# Patient Record
Sex: Female | Born: 1965 | Race: White | Hispanic: No | Smoking: Former smoker
Health system: Southern US, Community
[De-identification: ages and names within clinical notes are randomized; demographics above are authoritative.]

## PROBLEM LIST (undated history)

## (undated) DIAGNOSIS — C4490 Unspecified malignant neoplasm of skin, unspecified: Secondary | ICD-10-CM

## (undated) DIAGNOSIS — Z8669 Personal history of other diseases of the nervous system and sense organs: Secondary | ICD-10-CM

## (undated) DIAGNOSIS — B009 Herpesviral infection, unspecified: Secondary | ICD-10-CM

## (undated) DIAGNOSIS — K769 Liver disease, unspecified: Secondary | ICD-10-CM

## (undated) DIAGNOSIS — G43909 Migraine, unspecified, not intractable, without status migrainosus: Secondary | ICD-10-CM

## (undated) DIAGNOSIS — Z720 Tobacco use: Secondary | ICD-10-CM

## (undated) DIAGNOSIS — R3129 Other microscopic hematuria: Secondary | ICD-10-CM

## (undated) DIAGNOSIS — K219 Gastro-esophageal reflux disease without esophagitis: Secondary | ICD-10-CM

## (undated) DIAGNOSIS — IMO0002 Reserved for concepts with insufficient information to code with codable children: Secondary | ICD-10-CM

## (undated) DIAGNOSIS — F99 Mental disorder, not otherwise specified: Secondary | ICD-10-CM

## (undated) DIAGNOSIS — K279 Peptic ulcer, site unspecified, unspecified as acute or chronic, without hemorrhage or perforation: Secondary | ICD-10-CM

## (undated) DIAGNOSIS — B977 Papillomavirus as the cause of diseases classified elsewhere: Secondary | ICD-10-CM

## (undated) DIAGNOSIS — N809 Endometriosis, unspecified: Secondary | ICD-10-CM

## (undated) DIAGNOSIS — K589 Irritable bowel syndrome without diarrhea: Secondary | ICD-10-CM

## (undated) DIAGNOSIS — T7840XA Allergy, unspecified, initial encounter: Secondary | ICD-10-CM

## (undated) HISTORY — DX: Liver disease, unspecified: K76.9

## (undated) HISTORY — DX: Tobacco use: Z72.0

## (undated) HISTORY — DX: Papillomavirus as the cause of diseases classified elsewhere: B97.7

## (undated) HISTORY — PX: TUBAL LIGATION: SHX77

## (undated) HISTORY — PX: WISDOM TOOTH EXTRACTION: SHX21

## (undated) HISTORY — DX: Personal history of other diseases of the nervous system and sense organs: Z86.69

## (undated) HISTORY — PX: ABDOMINAL HYSTERECTOMY: SHX81

## (undated) HISTORY — PX: SALIVARY GLAND SURGERY: SHX768

## (undated) HISTORY — DX: Mental disorder, not otherwise specified: F99

## (undated) HISTORY — DX: Herpesviral infection, unspecified: B00.9

## (undated) HISTORY — DX: Gastro-esophageal reflux disease without esophagitis: K21.9

## (undated) HISTORY — PX: OTHER SURGICAL HISTORY: SHX169

## (undated) HISTORY — DX: Irritable bowel syndrome, unspecified: K58.9

## (undated) HISTORY — PX: APPENDECTOMY: SHX54

## (undated) HISTORY — DX: Endometriosis, unspecified: N80.9

## (undated) HISTORY — DX: Peptic ulcer, site unspecified, unspecified as acute or chronic, without hemorrhage or perforation: K27.9

## (undated) HISTORY — DX: Unspecified malignant neoplasm of skin, unspecified: C44.90

## (undated) HISTORY — DX: Allergy, unspecified, initial encounter: T78.40XA

## (undated) HISTORY — DX: Other microscopic hematuria: R31.29

## (undated) HISTORY — DX: Migraine, unspecified, not intractable, without status migrainosus: G43.909

## (undated) HISTORY — DX: Reserved for concepts with insufficient information to code with codable children: IMO0002

---

## 1998-09-09 ENCOUNTER — Other Ambulatory Visit: Admission: RE | Admit: 1998-09-09 | Discharge: 1998-09-09 | Payer: Self-pay | Admitting: Obstetrics

## 1998-10-07 ENCOUNTER — Encounter: Payer: Self-pay | Admitting: Obstetrics

## 1998-10-07 ENCOUNTER — Ambulatory Visit (HOSPITAL_COMMUNITY): Admission: RE | Admit: 1998-10-07 | Discharge: 1998-10-07 | Payer: Self-pay | Admitting: Obstetrics

## 1999-02-11 ENCOUNTER — Emergency Department (HOSPITAL_COMMUNITY): Admission: EM | Admit: 1999-02-11 | Discharge: 1999-02-11 | Payer: Self-pay

## 1999-02-18 ENCOUNTER — Emergency Department (HOSPITAL_COMMUNITY): Admission: EM | Admit: 1999-02-18 | Discharge: 1999-02-18 | Payer: Self-pay | Admitting: Emergency Medicine

## 1999-06-19 ENCOUNTER — Emergency Department (HOSPITAL_COMMUNITY): Admission: EM | Admit: 1999-06-19 | Discharge: 1999-06-19 | Payer: Self-pay | Admitting: Emergency Medicine

## 1999-07-01 ENCOUNTER — Emergency Department (HOSPITAL_COMMUNITY): Admission: EM | Admit: 1999-07-01 | Discharge: 1999-07-01 | Payer: Self-pay | Admitting: *Deleted

## 2000-09-03 ENCOUNTER — Encounter: Payer: Self-pay | Admitting: Family Medicine

## 2000-09-03 ENCOUNTER — Ambulatory Visit (HOSPITAL_COMMUNITY): Admission: RE | Admit: 2000-09-03 | Discharge: 2000-09-03 | Payer: Self-pay | Admitting: Family Medicine

## 2000-10-11 ENCOUNTER — Encounter (INDEPENDENT_AMBULATORY_CARE_PROVIDER_SITE_OTHER): Payer: Self-pay

## 2000-10-11 ENCOUNTER — Ambulatory Visit (HOSPITAL_COMMUNITY): Admission: RE | Admit: 2000-10-11 | Discharge: 2000-10-11 | Payer: Self-pay | Admitting: Gastroenterology

## 2000-11-04 ENCOUNTER — Ambulatory Visit (HOSPITAL_COMMUNITY): Admission: RE | Admit: 2000-11-04 | Discharge: 2000-11-04 | Payer: Self-pay | Admitting: Family Medicine

## 2000-11-04 ENCOUNTER — Encounter: Payer: Self-pay | Admitting: Family Medicine

## 2000-11-16 ENCOUNTER — Encounter: Payer: Self-pay | Admitting: Family Medicine

## 2000-11-16 ENCOUNTER — Encounter: Admission: RE | Admit: 2000-11-16 | Discharge: 2000-11-16 | Payer: Self-pay | Admitting: Family Medicine

## 2002-02-23 ENCOUNTER — Encounter: Payer: Self-pay | Admitting: Family Medicine

## 2002-02-23 ENCOUNTER — Ambulatory Visit (HOSPITAL_COMMUNITY): Admission: RE | Admit: 2002-02-23 | Discharge: 2002-02-23 | Payer: Self-pay | Admitting: Family Medicine

## 2002-08-01 ENCOUNTER — Ambulatory Visit (HOSPITAL_COMMUNITY): Admission: RE | Admit: 2002-08-01 | Discharge: 2002-08-01 | Payer: Self-pay | Admitting: Family Medicine

## 2002-08-01 ENCOUNTER — Encounter: Payer: Self-pay | Admitting: Family Medicine

## 2002-08-17 ENCOUNTER — Other Ambulatory Visit: Admission: RE | Admit: 2002-08-17 | Discharge: 2002-08-17 | Payer: Self-pay | Admitting: Family Medicine

## 2003-09-04 ENCOUNTER — Other Ambulatory Visit: Admission: RE | Admit: 2003-09-04 | Discharge: 2003-09-04 | Payer: Self-pay | Admitting: Obstetrics and Gynecology

## 2004-05-05 ENCOUNTER — Ambulatory Visit (HOSPITAL_COMMUNITY): Admission: RE | Admit: 2004-05-05 | Discharge: 2004-05-05 | Payer: Self-pay | Admitting: Family Medicine

## 2004-10-02 ENCOUNTER — Other Ambulatory Visit: Admission: RE | Admit: 2004-10-02 | Discharge: 2004-10-02 | Payer: Self-pay | Admitting: Obstetrics and Gynecology

## 2004-10-15 ENCOUNTER — Ambulatory Visit (HOSPITAL_COMMUNITY): Admission: RE | Admit: 2004-10-15 | Discharge: 2004-10-15 | Payer: Self-pay | Admitting: Obstetrics and Gynecology

## 2004-10-26 HISTORY — PX: OTHER SURGICAL HISTORY: SHX169

## 2005-04-20 ENCOUNTER — Encounter (INDEPENDENT_AMBULATORY_CARE_PROVIDER_SITE_OTHER): Payer: Self-pay | Admitting: *Deleted

## 2005-04-21 ENCOUNTER — Inpatient Hospital Stay (HOSPITAL_COMMUNITY): Admission: RE | Admit: 2005-04-21 | Discharge: 2005-04-22 | Payer: Self-pay | Admitting: Obstetrics and Gynecology

## 2005-10-22 ENCOUNTER — Ambulatory Visit (HOSPITAL_COMMUNITY): Admission: RE | Admit: 2005-10-22 | Discharge: 2005-10-22 | Payer: Self-pay | Admitting: Obstetrics and Gynecology

## 2005-11-02 ENCOUNTER — Other Ambulatory Visit: Admission: RE | Admit: 2005-11-02 | Discharge: 2005-11-02 | Payer: Self-pay | Admitting: Obstetrics and Gynecology

## 2006-11-04 ENCOUNTER — Ambulatory Visit (HOSPITAL_COMMUNITY): Admission: RE | Admit: 2006-11-04 | Discharge: 2006-11-04 | Payer: Self-pay | Admitting: Obstetrics and Gynecology

## 2007-11-07 ENCOUNTER — Ambulatory Visit (HOSPITAL_COMMUNITY): Admission: RE | Admit: 2007-11-07 | Discharge: 2007-11-07 | Payer: Self-pay | Admitting: Obstetrics and Gynecology

## 2008-12-06 ENCOUNTER — Ambulatory Visit (HOSPITAL_COMMUNITY): Admission: RE | Admit: 2008-12-06 | Discharge: 2008-12-06 | Payer: Self-pay | Admitting: Family Medicine

## 2009-12-10 ENCOUNTER — Ambulatory Visit (HOSPITAL_COMMUNITY): Admission: RE | Admit: 2009-12-10 | Discharge: 2009-12-10 | Payer: Self-pay | Admitting: Obstetrics and Gynecology

## 2010-11-13 ENCOUNTER — Other Ambulatory Visit (HOSPITAL_COMMUNITY): Payer: Self-pay | Admitting: Obstetrics and Gynecology

## 2010-11-13 DIAGNOSIS — Z1231 Encounter for screening mammogram for malignant neoplasm of breast: Secondary | ICD-10-CM

## 2010-12-12 ENCOUNTER — Ambulatory Visit (HOSPITAL_COMMUNITY): Payer: Self-pay

## 2011-01-02 ENCOUNTER — Ambulatory Visit (HOSPITAL_COMMUNITY)
Admission: RE | Admit: 2011-01-02 | Discharge: 2011-01-02 | Disposition: A | Discharge status: Home / Self Care | Payer: Self-pay | Source: Ambulatory Visit | Attending: Obstetrics and Gynecology | Admitting: Obstetrics and Gynecology

## 2011-01-02 DIAGNOSIS — Z1231 Encounter for screening mammogram for malignant neoplasm of breast: Secondary | ICD-10-CM | POA: Insufficient documentation

## 2011-03-13 NOTE — H&P (Signed)
Danielle Strickland, Danielle Strickland            ACCOUNT NO.:  [id number]   MEDICAL RECORD NO.:  [id number]          PATIENT TYPE:  INP   LOCATION:  NA                            FACILITY:  [facility]   PHYSICIAN:  Hal Morales, M.D.DATE OF BIRTH:  10/07/1966   DATE OF ADMISSION:  04/20/2005  DATE OF DISCHARGE:                                HISTORY & PHYSICAL   HISTORY OF PRESENT ILLNESS:  The patient is a 45 year old white married  female, para 3-0-0-3, who presents for hysterectomy and possible  oophorectomy secondary to a long history of pelvic pain presumably due to  endometriosis with failed attempts at medical management. The patient was  initially seen in 1995 by our practice for complaints of pelvic pain. She  had undergone a tubal ligation in the mid-1990s by laparoscopic tubal  cautery and at that time had the diagnosis of endometriosis made as an  incidental finding. She was initially treated with oral contraceptive pills  for her pelvic pain but subsequent to that had recurrence of pelvic pain  that was not responsive to her oral contraceptive pills. The patient also  had her birth control pills discontinued because of her smoking history. She  was lost to follow up for several years then returned to our care in 2004 at  which time she had primarily dysmenorrhea and heavy flow with her menses  which occurred at that time every 26 to 30 days without any intramenstrual  bleeding. Her workup included the pelvic ultrasound which showed only a  right ovarian simple cyst and she was initially treated with Provera 30 mg a  day. She had some improvement in her symptomatology with that medication for  several months but did have some irregular bleeding on the Provera lasting  for almost a month. She subsequently after about five months on Provera had  return of her pelvic pain and in October of 2004 was started on Lupron  therapy for her pelvic pain presumably secondary to endometriosis. She had  good relief of her pelvic pain during the time that she used the Lupron. She  received a total of six months of therapy with Lupron with good relief of  her pain and subsequently went on to Norethindrone 15 mg a day.   She continued with reasonable pain relief for approximately three months on  this regimen; however, she had significant breakthrough bleeding and then  breakthrough pain. At this point, the patient has bled for greater than  three months off and on, with  the Aygestin and has had much worsening of  her pelvic pain. She has thus decided to proceed with definitive therapy for  her endometriosis; however, she does want to retain her ovaries if at all  possible and if there is no visibile evidence of endometriosis.   PAST MEDICAL HISTORY:  1. Significant for melanoma excised in 2002 and for which she has yearly      follow up with Dr. Danella Deis.  2. History of peptic ulcer disease.  3. Irritable bowel syndrome.  4. History of benign colon polyps.  5. Migraine headaches.  6. History of  chemical hepatitis.   PAST SURGICAL HISTORY:  1. Tubal ligation in 1990.  2. Appendectomy prior to 1990.  3. Diagnostic laparoscopy at the time of tubal ligation in 1990.   FAMILY HISTORY:  Positive for colon cancer and breast cancer.   CURRENT MEDICATIONS:  1. Zelnorm 6 mg 1-2 tablets daily.  2. Protonix 40 mg daily.  3. Imitrex 100 mg at the onset of a headache p.r.n.  4. Norethindrone 20 mg daily.  5. Alprazolam 0.5 mg 1/2 to 1 up to t.i.d.  6. Tylenol PM 500 mg 2 h.s.  7. Tramadol 50 mg q.6h. p.r.n. pain.   ALLERGIES:  AUGMENTIN, CODEINE, LATEX.   REVIEW OF SYMPTOMS:  Positive for intermittent headaches, symptoms of  gastroesophageal reflux disease with occasional nausea, pelvic pain,  interventional bleeding all as mentioned above, constipation associated with  irritable bowel syndrome, anxiety.   SOCIAL HISTORY:  The patient is married and lives with her husband and  children.  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 She smokes approximately  a half pack of cigarettes a day but has smoked as much as two packs of  cigarettes per day. She has been smoking since she was 45 years old.   PHYSICAL EXAMINATION:  GENERAL:  The patient is a well-developed white  female in no acute distress.  VITAL SIGNS:  Blood pressure is 110/70. Weight is 159 pounds. Temperature is  98.7.  LUNGS:  Clear.  HEART:  Regular rate and rhythm.  BREASTS:  Without dominant masses, discharge, skin changes, or nipple  retraction.  ABDOMEN:  Soft without masses or organomegaly. There is mild tenderness to  deep palpation in both lower quadrants but no rebound.  PELVIC:  EG/BUS within normal limits. The vagina is rugous without lesions.  The cervix is without gross lesions. There is a small amount of blood from  the cervical os. The uterus sounds to 8 cm and is normal size, shape,  consistency, mobile with minimal tenderness on palpation. Adnexa show no  palpable masses. Rectovaginal show no masses.   LABORATORY DATA:  Endometrial biopsy performed on April 15, 2005 was benign.  Pap smear performed on October 02, 2004 was within normal limits and  negative for intraepithelial lesion or malignancy.   IMPRESSION:  1. Long history of pelvic pain and more recently abnormal uterine bleeding      with pelvic pain, thought to be secondary to endometriosis which was      diagnosed at the time of tubal ligation.  2. Failure of long-term relief from multiple medical modalities for      endometriosis.  3. Desire for hysterectomy both in management of endometriosis and      abnormal bleeding.  4. Cigarette abuse.  5. History of peptic ulcer disease with current diagnosis of      gastroesophageal reflux disease.  6. Migraine headaches.  7. Irritable bowel syndrome.   PLAN:  Several discussions have been held with the patient concerning options for management of her endometriosis and she has chosen  hysterectomy.  The patient has good mobility of the uterus and a laparoscopically assisted  vaginal hysterectomy may be possible. If laparoscopically, the patient has  significant adhesions or endometriosis that can not be managed  laparoscopically, then a total abdominal hysterectomy will be performed. If  there is visible evidence of endometriosis, the patient would like to have  the effected ovary or ovaries removed. If, however, there is no visible  evidence of endometriosis, the patient would  like to have her ovaries  preserved for hormonal function. Several discussions have been held  concerning the risks of anesthesia, bleeding, infection, damage to adjacent  organs as a result of her surgical procedure. It has  been explained that  there are other risks that cannot be predicted.  I have also explained that  no guarentee can be given with respect to pain relief after surgery, and the  patient has acknowledged understanding of these explanations. She wishes to  proceed with laparoscopically assisted vaginal hysterectomy with possible  total abdominal hysterectomy, possible unilateral salpingo-oophorectomy, and  possible bilateral salpingo-oophorectomy at [facility] on April 20, 2005.       VPH/MEDQ  D:  04/20/2005  T:  04/20/2005  Job:  [id number]

## 2011-03-13 NOTE — Op Note (Signed)
[facility]  Patient:    Danielle Strickland, Danielle Strickland                      MRN: [id number] Proc. Date: 10/11/00 Adm. Date:  [id number] Attending:  Nelda Marseille CC:         Danielle Strickland, M.D.   Operative Report  PROCEDURE:  Colonoscopy with hot biopsy.  ENDOSCOPIST:  Petra Kuba, M.D.  INDICATIONS:  Chronic constipation and family history of colon cancer. Consent was signed after risks, benefits, methods, and options were thoroughly discussed in the office.  MEDICINES USED:  Demerol 100, Versed 12.  DESCRIPTION OF PROCEDURE:  Rectum inspection was pertinent for external hemorrhoids.  Digital exam was negative.  The video pediatric colonoscope was inserted and easily advanced to the mid transverse.  At that point, the scope began to loop and, despite rolling her on her back and multiple abdominal pressures, we could not control the loop.  The scope was slowly withdrawn.  No abnormalities were seen on slow withdrawal back to the rectum.  Once back in the rectum, the scope was retroflexed and pertinent for some internal hemorrhoids and a small rectal polyp which was cold biopsied at the end of the procedure.  The scope was removed, and the regular video colonoscope was inserted and again easily advanced to the same location in the mid transverse. This time with rolling on her back and abdominal pressure was applied, we were able to control the loop and were easily able to advance from that point to the cecum which was identified by the appendiceal orifice and the ileocecal valve.  In fact the scope was inserted a short ways into the terminal ileum which was normal.  Photo documentation was obtained.  The scope was slowly withdrawn.  The prep was adequate.  There was some liquid stool that required suctioning only.  The cecum, ascending, and transverse were normal.  In the mid descending, a questionable tiny 1 to 2 mm polyp was seen and was  cold biopsied x 3.  The scope was further withdrawn.  No other abnormalities were seen as we slowly withdrew back to the rectum.  Another questionable tiny 1 mm polyp was seen in the rectum and was cold biopsied x 1.  The scope was retroflexed, and the polyp seen earlier was then cold biopsied x 1.  The scope was straightened, withdrawn, and scope removed.  The patient tolerated the procedure fairly well.  There was no obvious immediate complication.  ENDOSCOPIC DIAGNOSIS: 1. Internal and external hemorrhoids, possibly a tiny prolapse not mentioned    above. 2. Three tiny polyps, two in the rectum, one on retroflexion, and one in the    descending, all cold biopsied. 3. Otherwise within normal limits to the terminal ileum.  PLAN:  Await pathology but probably repeat screening in five years.  Trial of MiraLax for constipation.  Followup p.r.n. or in two months. DD:  10/11/00 TD:  10/11/00 Job: [id number]

## 2011-03-13 NOTE — Op Note (Signed)
NAMESIGOURNEY, PORTILLO            ACCOUNT NO.:  [id number]   MEDICAL RECORD NO.:  [id number]          PATIENT TYPE:  INP   LOCATION:  9399                          FACILITY:  [facility]   PHYSICIAN:  Hal Morales, M.D.DATE OF BIRTH:  [birth year]   DATE OF PROCEDURE:  04/20/2005  DATE OF DISCHARGE:                                 OPERATIVE REPORT   PREOPERATIVE DIAGNOSES:  1. Pelvic pain.  2. Endometriosis.  3. Abnormal uterine bleeding.   POSTOPERATIVE DIAGNOSES:  1. Pelvic pain.  2. Endometriosis.  3. Abnormal uterine bleeding.   OPERATION:  Laparoscopic bilateral salpingectomy with excisional biopsy of  peritoneal endometriosis, total vaginal hysterectomy.   SURGEON:  Hal Morales, M.D.   FIRST ASSISTANT:  Henreitta Leber, Certified Physician Assistant.   ANESTHESIA:  General orotracheal.   ESTIMATED BLOOD LOSS:  100 cc.   COMPLICATIONS:  None.   FINDINGS:  The uterus was normal size with stigmata of endometriosis at the  cornual region on the right side. The tubes were status post tubal  interruption for sterilization. The ovaries were normal bilaterally without  adhesions or stigmata of endometriosis.  There were stigmata of  endometriosis in the anterior cul-yuiy7de-sac and the left pelvic side wall,  both of which were excisionally biopsied.  There was no cul-de-sac  endometriosis that could be appreciated. The patient was status post  appendectomy.   DESCRIPTION OF PROCEDURE:  The patient was taken to the operating room after  appropriate identification and placed on the operating table.  After the  attainment of adequate general anesthesia, she was placed in the modified  lithotomy position.  The abdomen, perineum and vagina were prepped with  multiple layers of Betadine and a Foley catheter inserted into the bladder  and connected to straight drainage.  A Hulka tenaculum was placed on the  anterior cervix.  The abdomen and perineum were draped in sterile  field.  A  subumbilical injection and suprapubic injections of the right and left side  of 0.25% Marcaine was undertaken for a total of 10 cc.  A subumbilical  incision was made and Veress cannula placed through that incision into the  peritoneal cavity. A pneumoperitoneum was created with 2-1/2 liter of CO2.  The Veress cannula was removed and laparoscopic trocar placed through that  incision into the peritoneal cavity.  The laparoscope was placed through the  trocar sleeve. Suprapubic incisions were made to the right and left of  midline, and laparoscopic trocars placed through those incisions into the  peritoneal cavity under direct visualization.  The above-noted findings were  made, and excisional biopsy of the peritoneal endometriosis was undertaken.  Bilateral distal salpingectomy was undertaken by elevating the distal tube  and using two Endoloops of 0 Vicryl on each side, then excising the tubes  distal to the Endo-loops and bringing the tube through the umbilical port of  the laparoscope. These were sent as specimens with the uterus.  Hemostasis  was noted to be adequate and all instruments were turned off for use and the  surgeons moved to the vaginal area as the site of operation.  A weighted  speculum was placed in the posterior vagina and Lahey tenaculum placed on  the anterior and posterior surfaces of the cervix. The cervicovaginal mucosa  was injected with a dilute solution of Pitressin and circumscribed.  The  anterior vaginal mucosa was dissected off the anterior cervix and the  anterior peritoneum entered sharply.  A retractor was used to elevate the  bladder.  The posterior cul-de-sac was then entered sharply and the  uterosacral ligaments of right and left side clamped, cut and suture ligated  with those sutures being held.  The paracervical tissues were then clamped,  cut and suture ligated and the parametrial tissues clamped, cut and suture  ligated. The uterus was  inverted and the upper pedicles clamped, cut and  suture ligated x2 with those sutures being held.  A hemostatic suture needed  to be placed in the right vaginal cuff to incorporate a bleeding area, and  this allowed for adequate hemostasis.  A McCall culdoplasty suture was then  placed in the posterior cul-de-sac incorporating the two uterosacral  ligaments and the intervening peritoneum.  This was held.  The vaginal  angles were created with the held sutures on the uterosacral ligaments being  placed through the anterior and posterior vaginal mucosa and tied down at  each angle.  The remainder of the vaginal cuff was closed in a single layer  incorporating the anterior vaginal mucosa, anterior peritoneum, posterior  peritoneum and posterior vaginal mucosa in figure-of-eight sutures, all of 0  Vicryl.  Hemostasis was noted to be adequate and the [facility] culdoplasty  suture was tied down. Vaginal packing with 2 inch plain packing moistened  with Estrace cream was then placed in the vagina. The surgeon changed gloves  and went to the abdomen at the site of operation.  A pneumoperitoneum was  recreated, and the vaginal cuff and pedicles were inspected and found to be  hemostatic.  All instruments were removed from the peritoneal cavity under  direct visualization as the CO2 was allowed to escape. The subumbilical  incision was closed with a fascial suture of 0 Vicryl in a figure-of-eight  fashion and a subcuticular skin suture of 3-0 Vicryl.  The suprapubic  incisions were closed with subcuticular skin sutures.  Sterile dressings  were applied and the patient awakened from general anesthesia and taken to  the recovery room in satisfactory condition having tolerated the procedure  well with sponge and instrument counts correct.   SPECIMENS TO PATHOLOGY:  Uterus and cervix, bilateral tubes and excisional  biopsy of the peritoneal endometriosis.      VPH/MEDQ  D:  04/20/2005  T:  04/20/2005   Job:  [id number]   cc:   Lianne Bushy, M.D.  [address]  [place]  [place] [address]  Fax: [phone]

## 2011-03-13 NOTE — Discharge Summary (Signed)
Danielle Strickland, Danielle Strickland            ACCOUNT NO.:  [id number]   MEDICAL RECORD NO.:  [id number]          PATIENT TYPE:  INP   LOCATION:  9302                          [facility]ACILITY:  [facility]   PHYSICIAN:  Hal Morales, M.D.DATE OF BIRTH:  [birth year]   DATE OF ADMISSION:  04/20/2005  DATE OF DISCHARGE:  04/22/2005                                 DISCHARGE SUMMARY   DISCHARGE DIAGNOSES:  1.  Pelvic pain.  2.  Endometriosis.  3.  Adenomyosis.  4.  Abnormal uterine bleeding.   OPERATION:  On the date of admission, the patient underwent a laparoscopic  bilateral salpingectomy with peritoneal biopsies and a total vaginal  hysterectomy, tolerating all procedures well. The patient was found to have  a normal-size uterus with did not of endometriosis at the cornual region on  the right side. The patient's tubes were status post tubal interruption for  sterilization. Ovaries were normal bilaterally without adhesions or stigmata  of endometriosis. There was stigmata of endometriosis in the anterior cul-de-  sac and left pelvic side wall both of which were excisionally biopsied.  There was no cul-de-sac of endometriosis that could be appreciated. The  patient was status post appendectomy.   HISTORY OF PRESENT ILLNESS:  Mrs. Danielle Strickland is a 45 year old married white  female para 3-0-0-3 who presents for hysterectomy and possible oophorectomy  because of a long history of pelvic pain, presumably due to endometriosis  with failed attempts at medical management. Please see the patient's  dictated history and physical examination for details.   PHYSICAL EXAMINATION:  VITAL SIGNS:  Blood pressure 110/70. Weight is 159.  Temperature 98.7 degrees orally.  GENERAL:  Within normal limits.  ABDOMEN:  Her abdomen was soft without masses or organomegaly. There is mild  tenderness to deep palpation in both lower quadrants but no rebound.  PELVIC:  EG/BUS was within normal limits. Vagina is rugose without  lesions.  Cervix is without gross lesions. This there is a small amount of blood from  the cervical os. The uterus sounded to 8 cm and is normal-size, shape, and  consistency. Mobile with mild tenderness to palpation. Adnexa showed no  palpable masses. Rectovaginal no palpable masses.   HOSPITAL COURSE:  On the day of admission, the patient underwent an  aforementioned procedures tolerating them well. Postoperative course was  unremarkable with the patient resuming bowel and bladder function by  postoperative day #2 and therefore deemed ready for discharge home.  Postoperative hemoglobin 11.1. Preoperative hemoglobin 15.3.   DISCHARGE MEDICATIONS:  1.  Phenergan 25 milligrams 1 tablet every 6 hours as needed for nausea.  2.  Colace 1-2 tablets twice daily until bowel movements are regular.  3.  Tylox 1-2 tablets every 4 hours as needed for pain.  4.  Protonix 40 milligrams 1 tablet daily.  5.  Zelnorm 6 milligrams 1-2 tablets daily.  6.  Imitrex 100 milligrams 1 tablet at the onset of headache.  7.  Alprazolam 0.5 milligrams 1/2 to 1 tablet three times daily as needed.  8.  Flexeril 10 milligrams 1 tablet three times daily for muscle spasms.   FOLLOW-UP:  The patient is scheduled for a six-week postoperative visit with  Dr. Pennie Rushing on May 25, 2005 at 9 a.m. at the new location of [address] [facility] at [address].   DISCHARGE INSTRUCTIONS:  The patient was given a copy of [facility] postoperative instruction sheet. She was further advised to avoid  driving for two weeks, heavy lifting for four weeks and intercourse for six  weeks. The patient's diet was without restriction.   FINAL PATHOLOGY:  Peritoneal biopsy: Findings consistent with endometriosis.  Uterus, cervix, right and left fallopian tubes: Chronic cervicitis with  squamous metaplasia. No intraepithelial lesion identified. Endometrium:  Inactive endometrial glands and  decidualized stroma suggestive of exogenous  progestin therapy. No hyperplasia or malignancy identified. Myometrium:  Adenomyosis. Right and left fallopian tubes: Benign fallopian tubes.       EJP/MEDQ  D:  05/11/2005  T:  05/12/2005  Job:  [id number]

## 2012-01-27 ENCOUNTER — Other Ambulatory Visit: Payer: Self-pay | Admitting: Obstetrics and Gynecology

## 2012-05-14 ENCOUNTER — Other Ambulatory Visit: Payer: Self-pay | Admitting: Obstetrics and Gynecology

## 2012-08-17 ENCOUNTER — Other Ambulatory Visit: Payer: Self-pay | Admitting: Obstetrics and Gynecology

## 2012-08-17 DIAGNOSIS — Z1231 Encounter for screening mammogram for malignant neoplasm of breast: Secondary | ICD-10-CM

## 2012-08-24 ENCOUNTER — Ambulatory Visit: Payer: BC Managed Care – PPO | Admitting: Obstetrics and Gynecology

## 2012-09-07 ENCOUNTER — Encounter: Payer: Self-pay | Admitting: Obstetrics and Gynecology

## 2012-09-07 ENCOUNTER — Ambulatory Visit (HOSPITAL_COMMUNITY)
Admission: RE | Admit: 2012-09-07 | Discharge: 2012-09-07 | Disposition: A | Discharge status: Home / Self Care | Payer: BC Managed Care – PPO | Source: Ambulatory Visit | Attending: Obstetrics and Gynecology | Admitting: Obstetrics and Gynecology

## 2012-09-07 ENCOUNTER — Ambulatory Visit (INDEPENDENT_AMBULATORY_CARE_PROVIDER_SITE_OTHER): Payer: BC Managed Care – PPO | Admitting: Obstetrics and Gynecology

## 2012-09-07 ENCOUNTER — Telehealth: Payer: Self-pay | Admitting: Obstetrics and Gynecology

## 2012-09-07 VITALS — BP 120/66 | Ht 64.0 in | Wt 154.0 lb

## 2012-09-07 DIAGNOSIS — Z01419 Encounter for gynecological examination (general) (routine) without abnormal findings: Secondary | ICD-10-CM

## 2012-09-07 DIAGNOSIS — Z1231 Encounter for screening mammogram for malignant neoplasm of breast: Secondary | ICD-10-CM | POA: Insufficient documentation

## 2012-09-07 DIAGNOSIS — R319 Hematuria, unspecified: Secondary | ICD-10-CM

## 2012-09-07 LAB — POCT URINALYSIS DIPSTICK
Leukocytes, UA: NEGATIVE
Nitrite, UA: NEGATIVE
Protein, UA: NEGATIVE
pH, UA: 5

## 2012-09-07 MED ORDER — SUMATRIPTAN SUCCINATE 50 MG PO TABS: ORAL_TABLET | ORAL | Status: DC

## 2012-09-07 MED ORDER — PROMETHAZINE HCL 25 MG PO TABS: 25.0000 mg | ORAL_TABLET | Freq: Four times a day (QID) | ORAL | Status: DC | PRN

## 2012-09-07 NOTE — Progress Notes (Signed)
Regular Periods: no hyst  Mammogram: yes  Monthly Breast Ex.: no Exercise: yes  Tetanus < 10 years: no Seatbelts: yes  NI. Bladder Functn.: yes Abuse at home: no  Daily BM's: no Stressful Work: no  Healthy Diet: yes Sigmoid-Colonoscopy: every 5 years   Calcium: yes Medical problems this year: right ear maybe infection    LAST PAP:01/16/2011  Contraception: HYST  Mammogram:  Today at 3:00   PCP: none   PMH: unchanged  FMH: unchanged   Pt is requesting a pap today .

## 2012-09-07 NOTE — Progress Notes (Addendum)
Subjective:    Danielle Strickland is a 46 y.o. female, G3P3003, who presents for an annual exam. The patient reports no problems except (though a newlywed) marital discord. States spouse is bipolar but won't take his medication.  Menstrual cycle:   LMP: No LMP recorded. Patient has had a hysterectomy.             Review of Systems Pertinent items are noted in HPI. Denies pelvic pain, urinary tract symptoms, vaginitis symptoms, irregular bleeding, menopausal symptoms, change in bowel habits or rectal bleeding   Objective:    BP 120/66  Ht 5\' 4"  (1.626 m)  Wt 154 lb (69.854 kg)  BMI 26.43 kg/m2    Wt Readings from Last 1 Encounters:  09/07/12 154 lb (69.854 kg)   Body mass index is 26.43 kg/(m^2). General Appearance: Alert, no acute distress HEENT: Grossly normal Neck / Thyroid: Supple, no thyromegaly or cervical adenopathy Lungs: Clear to auscultation bilaterally Back: No CVA tenderness Breast Exam: No masses or nodes.No dimpling, nipple retraction or discharge. Cardiovascular: Regular rate and rhythm.  Gastrointestinal: Soft, non-tender, no masses or organomegaly Pelvic Exam: EGBUS-wnl, vagina-normal rugae, cervix/uterus-surgically absent; adnexae-no masses or tenderness Rectovaginal: no masses and normal sphincter tone Lymphatic Exam: Non-palpable nodes in neck, clavicular,  axillary, or inguinal regions  Skin: no rashes or abnormalities Extremities: no clubbing cyanosis or edema  Neurologic: grossly normal Psychiatric: Alert and oriented   Assessment:   Routine GYN Exam H/O Abnormal Vaginal Cytology   Plan:  Urged counseling for marital discord and gathering of information on bipolar disorder  PAP sent  RTO 1 year or prn  Shania Bjelland,ELMIRAPA-C

## 2012-09-08 LAB — PAP IG W/ RFLX HPV ASCU

## 2012-09-20 ENCOUNTER — Encounter: Payer: Self-pay | Admitting: Obstetrics and Gynecology

## 2012-09-26 ENCOUNTER — Telehealth: Payer: Self-pay | Admitting: Obstetrics and Gynecology

## 2012-09-26 NOTE — Telephone Encounter (Signed)
Called pt to advise of dense breast letter. Advised pt dense breast puts at increased risk for Breast Cancer. After giving pt all info. Pt states that she has a family history of breast cancer with her maternal grandmother and maternal aunt, pt also states that she is a smoker. Wanting to know if a MRI is reccommended or if she needs to just continue with yearly mmg consider history.

## 2012-10-05 ENCOUNTER — Encounter: Payer: Self-pay | Admitting: Obstetrics and Gynecology

## 2012-10-11 ENCOUNTER — Telehealth: Payer: Self-pay | Admitting: Obstetrics and Gynecology

## 2012-10-11 NOTE — Telephone Encounter (Signed)
Ep, this is pt's second call. [name] D,CMA last spoke with pt.

## 2012-10-20 NOTE — Telephone Encounter (Signed)
Patient with maternal grandmother/aunt with a history of breast cancer and personal history of dense breast asking if an MRI is recommended.  Current guidelines do not recommend breast MRI in patients with her profile. The breast letter she received outlined options that the patient may choose to pursue for further evaluation, however, insurance will most likely not cover the expense.  Hansel Devan, PA-C

## 2012-10-20 NOTE — Telephone Encounter (Signed)
[name], EP responding to your message.

## 2012-10-20 NOTE — Telephone Encounter (Signed)
Pt advised and voiced understanding  Analyah Mcconnon, CMA  

## 2013-02-21 ENCOUNTER — Encounter: Payer: Self-pay | Admitting: Family Medicine

## 2013-02-21 ENCOUNTER — Ambulatory Visit (INDEPENDENT_AMBULATORY_CARE_PROVIDER_SITE_OTHER): Payer: BC Managed Care – PPO | Admitting: Family Medicine

## 2013-02-21 VITALS — BP 110/70 | HR 92 | Temp 98.0°F | Ht 64.25 in | Wt 148.0 lb

## 2013-02-21 DIAGNOSIS — Z720 Tobacco use: Secondary | ICD-10-CM

## 2013-02-21 DIAGNOSIS — C4490 Unspecified malignant neoplasm of skin, unspecified: Secondary | ICD-10-CM

## 2013-02-21 DIAGNOSIS — F99 Mental disorder, not otherwise specified: Secondary | ICD-10-CM

## 2013-02-21 DIAGNOSIS — F411 Generalized anxiety disorder: Secondary | ICD-10-CM | POA: Insufficient documentation

## 2013-02-21 DIAGNOSIS — S0300XA Dislocation of jaw, unspecified side, initial encounter: Secondary | ICD-10-CM | POA: Insufficient documentation

## 2013-02-21 DIAGNOSIS — Z136 Encounter for screening for cardiovascular disorders: Secondary | ICD-10-CM

## 2013-02-21 DIAGNOSIS — Z87891 Personal history of nicotine dependence: Secondary | ICD-10-CM | POA: Insufficient documentation

## 2013-02-21 DIAGNOSIS — F17200 Nicotine dependence, unspecified, uncomplicated: Secondary | ICD-10-CM

## 2013-02-21 DIAGNOSIS — T7840XA Allergy, unspecified, initial encounter: Secondary | ICD-10-CM

## 2013-02-21 DIAGNOSIS — Z800 Family history of malignant neoplasm of digestive organs: Secondary | ICD-10-CM | POA: Insufficient documentation

## 2013-02-21 DIAGNOSIS — F4323 Adjustment disorder with mixed anxiety and depressed mood: Secondary | ICD-10-CM

## 2013-02-21 DIAGNOSIS — K589 Irritable bowel syndrome without diarrhea: Secondary | ICD-10-CM

## 2013-02-21 DIAGNOSIS — Z8669 Personal history of other diseases of the nervous system and sense organs: Secondary | ICD-10-CM

## 2013-02-21 DIAGNOSIS — B009 Herpesviral infection, unspecified: Secondary | ICD-10-CM | POA: Insufficient documentation

## 2013-02-21 DIAGNOSIS — K279 Peptic ulcer, site unspecified, unspecified as acute or chronic, without hemorrhage or perforation: Secondary | ICD-10-CM | POA: Insufficient documentation

## 2013-02-21 DIAGNOSIS — Z0000 Encounter for general adult medical examination without abnormal findings: Secondary | ICD-10-CM

## 2013-02-21 HISTORY — DX: Tobacco use: Z72.0

## 2013-02-21 LAB — COMPREHENSIVE METABOLIC PANEL
ALT: 13 U/L (ref 0–35)
AST: 16 U/L (ref 0–37)
BUN: 8 mg/dL (ref 6–23)
CO2: 26 mEq/L (ref 19–32)
Calcium: 8.7 mg/dL (ref 8.4–10.5)
Chloride: 104 mEq/L (ref 96–112)
Creatinine, Ser: 0.6 mg/dL (ref 0.4–1.2)
GFR: 110.68 mL/min (ref >60.00)
Total Bilirubin: 0.4 mg/dL (ref 0.3–1.2)

## 2013-02-21 LAB — LIPID PANEL
HDL: 32.4 mg/dL — ABNORMAL LOW (ref >39.00)
Triglycerides: 261 mg/dL — ABNORMAL HIGH (ref 0.0–149.0)
VLDL: 52.2 mg/dL — ABNORMAL HIGH (ref 0.0–40.0)

## 2013-02-21 MED ORDER — CYCLOBENZAPRINE HCL 5 MG PO TABS: 5.0000 mg | ORAL_TABLET | Freq: Three times a day (TID) | ORAL | Status: DC | PRN
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
## 2013-02-21 NOTE — Patient Instructions (Addendum)
Nice to meet you.  We will call you with your lab results.  Please stop by to see Shirlee Limerick on your way out to set up your referral.  Please take Flexeril as needed for right ear pain.  Let me know if this works.  Temporomandibular Joint Pain Your exam shows that you have a problem with your temporomandibular joint (TMJ), the joint that moves when you open your mouth or chew food. TMJ problems can result from direct injuries, bite abnormalities, or tension states which cause you to grind or clench your teeth. Typical symptoms include pain around the joint, clicking, restricted movement, and headaches. The TMJ is like any other joint in the body; when it is strained, it needs rest to repair itself. To keep the joint at rest it is important that you do not open your mouth wider than the width of your index finger. If you must yawn, be sure to support your chin with your hand so your mouth does not open wide. Eat a soft diet (nothing firmer than ground beef, no raw vegetables), do not chew gum and do not talk if it causes you pain. Apply topical heat by using a warm, moist cloth placed in front of the ear for 15 to 20 minutes several times daily. Alternating heat and ice may give even more relief. Anti-inflammatory pain medicine and muscle relaxants can also be helpful. A dental orthotic or splint may be used for temporary relief. Long-term problems may require treatment for stress as well as braces or surgery. Please check with your doctor or dentist if your symptoms do not improve within one week. Document Released: 11/19/2004 Document Revised: 01/04/2012 Document Reviewed: 10/12/2005 [facility] Patient Information 2013 [place], [place].

## 2013-02-22 ENCOUNTER — Telehealth: Payer: Self-pay | Admitting: *Deleted

## 2013-02-22 MED ORDER — VARENICLINE TARTRATE 0.5 MG X 11 & 1 MG X 42 PO MISC: ORAL | Status: DC

## 2013-02-22 NOTE — Telephone Encounter (Signed)
Medicine called to [facility]. 

## 2013-02-22 NOTE — Telephone Encounter (Signed)
Pt is asking for chantix starter pack.  She says she took this before to stop smoking and it helped, but she only took it for a month and intends to take the medicine for 3 months this time.  Uses [facility] [address].

## 2013-02-22 NOTE — Telephone Encounter (Signed)
Rx sent 

## 2013-03-21 ENCOUNTER — Ambulatory Visit (INDEPENDENT_AMBULATORY_CARE_PROVIDER_SITE_OTHER): Payer: BC Managed Care – PPO | Admitting: Psychology

## 2013-03-21 DIAGNOSIS — F4323 Adjustment disorder with mixed anxiety and depressed mood: Secondary | ICD-10-CM

## 2013-03-30 ENCOUNTER — Ambulatory Visit (INDEPENDENT_AMBULATORY_CARE_PROVIDER_SITE_OTHER): Payer: BC Managed Care – PPO | Admitting: Family Medicine

## 2013-03-30 ENCOUNTER — Encounter: Payer: Self-pay | Admitting: Family Medicine

## 2013-03-30 ENCOUNTER — Encounter: Payer: Self-pay | Admitting: *Deleted

## 2013-03-30 VITALS — BP 110/70 | HR 97 | Temp 98.3°F | Wt 147.0 lb

## 2013-03-30 DIAGNOSIS — H9201 Otalgia, right ear: Secondary | ICD-10-CM | POA: Insufficient documentation

## 2013-03-30 DIAGNOSIS — Z5189 Encounter for other specified aftercare: Secondary | ICD-10-CM

## 2013-03-30 DIAGNOSIS — J029 Acute pharyngitis, unspecified: Secondary | ICD-10-CM | POA: Insufficient documentation

## 2013-03-30 DIAGNOSIS — F17200 Nicotine dependence, unspecified, uncomplicated: Secondary | ICD-10-CM

## 2013-03-30 DIAGNOSIS — S0300XD Dislocation of jaw, unspecified side, subsequent encounter: Secondary | ICD-10-CM

## 2013-03-30 DIAGNOSIS — H9209 Otalgia, unspecified ear: Secondary | ICD-10-CM

## 2013-03-30 DIAGNOSIS — Z720 Tobacco use: Secondary | ICD-10-CM

## 2013-03-30 LAB — POCT RAPID STREP A (OFFICE): Rapid Strep A Screen: NEGATIVE

## 2013-03-30 MED ORDER — CIPROFLOXACIN-DEXAMETHASONE 0.3-0.1 % OT SUSP: 4.0000 [drp] | Freq: Two times a day (BID) | OTIC | Status: DC

## 2013-03-30 NOTE — Assessment & Plan Note (Signed)
Mild erythema of entire canal as well as forming pustule at entrance to canal - treat pustule with warm compresses as well as possible otitis externa with ciprodex. Prescription sent to pharmacy.

## 2013-03-30 NOTE — Assessment & Plan Note (Signed)
Encouraged cessation.

## 2013-03-30 NOTE — Patient Instructions (Addendum)
I think you have TMJ as well as pustule in outer ear canal contributing to pain.  Use warm compresses to outer ear. Do stretching exercises for TMJ. Treat TMJ with aleve two pills twice daily with food as tolerated. For possible ear infection - treat with ciprodex ear drops - sent to pharmacy. Let us know if not improving as expected. Return to see Dr. Dayton Martes when you have rash.

## 2013-03-30 NOTE — Addendum Note (Signed)
Addended by: Shon Millet on: 03/30/2013 12:34 PM   Modules accepted: Orders

## 2013-03-30 NOTE — Progress Notes (Signed)
  Subjective:    Patient ID: Danielle Strickland, female    DOB: 17-Feb-1966, 47 y.o.   MRN: [id number]  HPI CC: R ear pain  2 wk h/o itching inside ear.  Pain started yesterday.  Only on right ear.  + ST, HA that travels to occipital region, facial and tooth pain.  Mild dry cough.  Mild muffled hearing.  Some pain to open mouth.  No recent swimming.  H/o bullous otitis in her 15s.  No fevers/chills, congestion, rhinorrhea.  No drainage from ear. Taking flexeril muscle relaxant.    Throat surgery 2008 - saliva gland removed.  Since then, having R ear issue.  Having intermittent flareups that last up to 24 hours.  Has been told by Dr. Dayton Martes she has TMJ in past.  Grandson and daughter with similar illness, dx with strep throat today.  Also wants eval of rash - not currently present Under more stress recently.  Past Medical History  Diagnosis Date  . HPV in female   . Abnormal Pap smear of vagina and vaginal HPV     ascus   . Migraines   . Microscopic hematuria   . Mental disorder     anxiety  . Endometriosis   . 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 GERD (gastroesophageal reflux disease)   . IBS (irritable bowel syndrome)   . Liver disease     H/O hepatitis  . Allergy   . Skin cancer     left thigh  . Peptic ulcer disease   . History of migraine      Review of Systems Per HPI    Objective:   Physical Exam  Nursing note and vitals reviewed. Constitutional: She appears well-developed and well-nourished. No distress.  HENT:  Head: Normocephalic and atraumatic.  Right Ear: Hearing, tympanic membrane and external ear normal.  Left Ear: Hearing, tympanic membrane and external ear normal.  Nose: No mucosal edema or rhinorrhea. Right sinus exhibits no maxillary sinus tenderness and no frontal sinus tenderness. Left sinus exhibits no maxillary sinus tenderness and no frontal sinus tenderness.  Mouth/Throat: Uvula is midline and mucous membranes are normal.  Posterior oropharyngeal erythema present. No oropharyngeal exudate, posterior oropharyngeal edema or tonsillar abscesses.  Slight erythema of R ear canal, early pustule outer R canal. Pain with palpation below pinna and at TMJ, along with popping and asymmetry of motion.  Eyes: Conjunctivae and EOM are normal. Pupils are equal, round, and reactive to light. No scleral icterus.  Neck: Normal range of motion. Neck supple.  Lymphadenopathy:    She has no cervical adenopathy.  Skin: Skin is warm and dry. No rash noted.       Assessment & Plan:

## 2013-03-30 NOTE — Assessment & Plan Note (Addendum)
RST checked 2/2 exposure - negative.

## 2013-03-30 NOTE — Assessment & Plan Note (Signed)
Discussed TMJ stretching exercises that I want her to start once pain improved. May use aleve 2 pills bid with food prn pain.

## 2013-04-12 ENCOUNTER — Ambulatory Visit: Payer: BC Managed Care – PPO | Admitting: Psychology

## 2013-04-19 ENCOUNTER — Encounter: Payer: Self-pay | Admitting: Family Medicine

## 2013-04-19 ENCOUNTER — Ambulatory Visit (INDEPENDENT_AMBULATORY_CARE_PROVIDER_SITE_OTHER): Payer: BC Managed Care – PPO | Admitting: Family Medicine

## 2013-04-19 DIAGNOSIS — T6391XA Toxic effect of contact with unspecified venomous animal, accidental (unintentional), initial encounter: Secondary | ICD-10-CM

## 2013-04-19 DIAGNOSIS — T63481A Toxic effect of venom of other arthropod, accidental (unintentional), initial encounter: Secondary | ICD-10-CM

## 2013-04-19 MED ORDER — DICLOFENAC SODIUM 75 MG PO TBEC: 75.0000 mg | DELAYED_RELEASE_TABLET | Freq: Two times a day (BID) | ORAL | Status: DC

## 2013-04-19 NOTE — Patient Instructions (Addendum)
Elevate hand when able. Ice 2-3 times a day. Take diclofenac 75 mg twice with meals. Can use  benadryl at bedtime. Can use zyrtec or claritin during the day. Call if redness spreading or if not improving in next 3-4 days.

## 2013-04-19 NOTE — Progress Notes (Signed)
  Subjective:    Patient ID: Danielle Strickland, female    DOB: 08/23/1966, 47 y.o.   MRN: [id number]  HPI  47 year old female presents with  Pain and swelling in right  hand x 3 days. Following getting into bed.. Burning sensation then sharp pain. Occurred after pulling up sheets ? Bite, no insect seen.  No bite marks seen. No other injury. Having shooting pains in right hand, up arm. Tingling at times in right pinky.  Hard to grip things due to pain.  Has not used anything for it. Tried a brace but this caused pain.  Current smoker.   Review of Systems  Constitutional: Negative for fever and fatigue.  HENT: Negative for ear pain.   Eyes: Negative for pain.  Respiratory: Negative for chest tightness and shortness of breath.   Cardiovascular: Negative for chest pain, palpitations and leg swelling.  Gastrointestinal: Negative for abdominal pain.  Genitourinary: Negative for dysuria.       Objective:   Physical Exam  Constitutional: Vital signs are normal. She appears well-developed and well-nourished. She is cooperative.  Non-toxic appearance. She does not appear ill. No distress.  HENT:  Head: Normocephalic.  Right Ear: Hearing, tympanic membrane, external ear and ear canal normal. Tympanic membrane is not erythematous, not retracted and not bulging.  Left Ear: Hearing, tympanic membrane, external ear and ear canal normal. Tympanic membrane is not erythematous, not retracted and not bulging.  Nose: No mucosal edema or rhinorrhea. Right sinus exhibits no maxillary sinus tenderness and no frontal sinus tenderness. Left sinus exhibits no maxillary sinus tenderness and no frontal sinus tenderness.  Mouth/Throat: Uvula is midline, oropharynx is clear and moist and mucous membranes are normal.  Eyes: Conjunctivae, EOM and lids are normal. Pupils are equal, round, and reactive to light. No foreign bodies found.  Neck: Trachea normal and normal range of motion. Neck supple. Carotid bruit is  not present. No mass and no thyromegaly present.  Cardiovascular: Normal rate, regular rhythm, S1 normal, S2 normal, normal heart sounds, intact distal pulses and normal pulses.  Exam reveals no gallop and no friction rub.   No murmur heard. Pulmonary/Chest: Effort normal and breath sounds normal. Not tachypneic. No respiratory distress. She has no decreased breath sounds. She has no wheezes. She has no rhonchi. She has no rales.  Abdominal: Soft. Normal appearance and bowel sounds are normal. There is no tenderness.  Neurological: She is alert.  Skin: Skin is warm, dry and intact. No rash noted.  Round focal area of erythema and swelling on right dorsal hand  Psychiatric: Her speech is normal and behavior is normal. Judgment and thought content normal. Her mood appears not anxious. Cognition and memory are normal. She does not exhibit a depressed mood.          Assessment & Plan:

## 2013-05-01 DIAGNOSIS — T63481A Toxic effect of venom of other arthropod, accidental (unintentional), initial encounter: Secondary | ICD-10-CM | POA: Insufficient documentation

## 2013-05-01 NOTE — Assessment & Plan Note (Signed)
Treat with antihistamines, elevation. No clear sign of bacterial infection.

## 2013-05-03 ENCOUNTER — Ambulatory Visit (INDEPENDENT_AMBULATORY_CARE_PROVIDER_SITE_OTHER): Payer: BC Managed Care – PPO | Admitting: Psychology

## 2013-05-03 DIAGNOSIS — F4323 Adjustment disorder with mixed anxiety and depressed mood: Secondary | ICD-10-CM

## 2013-05-23 ENCOUNTER — Ambulatory Visit (INDEPENDENT_AMBULATORY_CARE_PROVIDER_SITE_OTHER): Payer: BC Managed Care – PPO | Admitting: Psychology

## 2013-05-23 DIAGNOSIS — F4323 Adjustment disorder with mixed anxiety and depressed mood: Secondary | ICD-10-CM

## 2013-06-14 ENCOUNTER — Ambulatory Visit (INDEPENDENT_AMBULATORY_CARE_PROVIDER_SITE_OTHER): Payer: BC Managed Care – PPO | Admitting: Psychology

## 2013-06-14 DIAGNOSIS — F4323 Adjustment disorder with mixed anxiety and depressed mood: Secondary | ICD-10-CM

## 2013-07-06 ENCOUNTER — Other Ambulatory Visit: Payer: Self-pay | Admitting: Obstetrics and Gynecology

## 2013-07-06 ENCOUNTER — Ambulatory Visit (INDEPENDENT_AMBULATORY_CARE_PROVIDER_SITE_OTHER): Payer: BC Managed Care – PPO | Admitting: Psychology

## 2013-07-06 DIAGNOSIS — F4323 Adjustment disorder with mixed anxiety and depressed mood: Secondary | ICD-10-CM

## 2013-07-06 DIAGNOSIS — N644 Mastodynia: Secondary | ICD-10-CM

## 2013-07-11 ENCOUNTER — Ambulatory Visit: Payer: BC Managed Care – PPO | Admitting: Psychology

## 2013-07-19 ENCOUNTER — Ambulatory Visit
Admission: RE | Admit: 2013-07-19 | Discharge: 2013-07-19 | Disposition: A | Discharge status: Home / Self Care | Payer: BC Managed Care – PPO | Source: Ambulatory Visit | Attending: Obstetrics and Gynecology | Admitting: Obstetrics and Gynecology

## 2013-07-19 DIAGNOSIS — N644 Mastodynia: Secondary | ICD-10-CM

## 2013-07-20 ENCOUNTER — Ambulatory Visit: Payer: BC Managed Care – PPO | Admitting: Psychology

## 2013-08-31 ENCOUNTER — Other Ambulatory Visit: Payer: Self-pay

## 2014-01-09 ENCOUNTER — Emergency Department: Payer: Self-pay | Admitting: Emergency Medicine

## 2014-01-09 LAB — CBC WITH DIFFERENTIAL/PLATELET
Basophil #: 0.1 10*3/uL (ref 0.0–0.1)
Basophil %: 0.9 %
EOS PCT: 2.8 %
Eosinophil #: 0.2 10*3/uL (ref 0.0–0.7)
HCT: 41.4 % (ref 35.0–47.0)
HGB: 14.3 g/dL (ref 12.0–16.0)
LYMPHS ABS: 2.4 10*3/uL (ref 1.0–3.6)
Lymphocyte %: 27.8 %
MCH: 33.3 pg (ref 26.0–34.0)
MCHC: 34.5 g/dL (ref 32.0–36.0)
MCV: 97 fL (ref 80–100)
Monocyte #: 0.4 x10 3/mm (ref 0.2–0.9)
Monocyte %: 5.1 %
Neutrophil #: 5.4 10*3/uL (ref 1.4–6.5)
Neutrophil %: 63.4 %
Platelet: 254 10*3/uL (ref 150–440)
RBC: 4.29 10*6/uL (ref 3.80–5.20)
RDW: 13.7 % (ref 11.5–14.5)
WBC: 8.5 10*3/uL (ref 3.6–11.0)

## 2014-01-09 LAB — URINALYSIS, COMPLETE
Bilirubin,UR: NEGATIVE
GLUCOSE, UR: NEGATIVE mg/dL (ref 0–75)
Ketone: NEGATIVE
Leukocyte Esterase: NEGATIVE
Nitrite: NEGATIVE
PH: 6 (ref 4.5–8.0)
Protein: NEGATIVE
SPECIFIC GRAVITY: 1.021 (ref 1.003–1.030)

## 2014-01-09 LAB — COMPREHENSIVE METABOLIC PANEL
ANION GAP: 5 — AB (ref 7–16)
Albumin: 4.1 g/dL (ref 3.4–5.0)
Alkaline Phosphatase: 82 U/L
BUN: 12 mg/dL (ref 7–18)
Bilirubin,Total: 0.4 mg/dL (ref 0.2–1.0)
Calcium, Total: 8.7 mg/dL (ref 8.5–10.1)
Chloride: 107 mmol/L (ref 98–107)
Co2: 25 mmol/L (ref 21–32)
Creatinine: 0.88 mg/dL (ref 0.60–1.30)
EGFR (Non-African Amer.): >60
GLUCOSE: 107 mg/dL — AB (ref 65–99)
OSMOLALITY: 274 (ref 275–301)
POTASSIUM: 3.6 mmol/L (ref 3.5–5.1)
SGOT(AST): 19 U/L (ref 15–37)
SGPT (ALT): 23 U/L (ref 12–78)
Sodium: 137 mmol/L (ref 136–145)
Total Protein: 7.8 g/dL (ref 6.4–8.2)

## 2014-01-09 LAB — LIPASE, BLOOD: Lipase: 124 U/L (ref 73–393)

## 2014-01-09 LAB — TROPONIN I: Troponin-I: <0.02 ng/mL

## 2014-01-10 ENCOUNTER — Ambulatory Visit: Payer: BC Managed Care – PPO | Admitting: Family Medicine

## 2014-01-11 ENCOUNTER — Observation Stay: Payer: Self-pay | Admitting: Surgery

## 2014-01-12 LAB — COMPREHENSIVE METABOLIC PANEL
ALBUMIN: 3.1 g/dL — AB (ref 3.4–5.0)
ANION GAP: 5 — AB (ref 7–16)
AST: 21 U/L (ref 15–37)
Alkaline Phosphatase: 63 U/L
BILIRUBIN TOTAL: 0.2 mg/dL (ref 0.2–1.0)
BUN: 7 mg/dL (ref 7–18)
CALCIUM: 8.1 mg/dL — AB (ref 8.5–10.1)
CREATININE: 0.78 mg/dL (ref 0.60–1.30)
Chloride: 109 mmol/L — ABNORMAL HIGH (ref 98–107)
Co2: 26 mmol/L (ref 21–32)
EGFR (African American): >60
EGFR (Non-African Amer.): >60
GLUCOSE: 103 mg/dL — AB (ref 65–99)
OSMOLALITY: 278 (ref 275–301)
Potassium: 3.7 mmol/L (ref 3.5–5.1)
SGPT (ALT): 23 U/L (ref 12–78)
Sodium: 140 mmol/L (ref 136–145)
TOTAL PROTEIN: 6.4 g/dL (ref 6.4–8.2)

## 2014-01-12 LAB — CBC WITH DIFFERENTIAL/PLATELET
BASOS ABS: 0 10*3/uL (ref 0.0–0.1)
BASOS PCT: 0.5 %
Eosinophil #: 0.4 10*3/uL (ref 0.0–0.7)
Eosinophil %: 5.2 %
HCT: 37.4 % (ref 35.0–47.0)
HGB: 13.4 g/dL (ref 12.0–16.0)
LYMPHS PCT: 33.8 %
Lymphocyte #: 2.8 10*3/uL (ref 1.0–3.6)
MCH: 34.9 pg — AB (ref 26.0–34.0)
MCHC: 35.9 g/dL (ref 32.0–36.0)
MCV: 97 fL (ref 80–100)
Monocyte #: 0.6 x10 3/mm (ref 0.2–0.9)
Monocyte %: 7.4 %
Neutrophil #: 4.4 10*3/uL (ref 1.4–6.5)
Neutrophil %: 53.1 %
Platelet: 217 10*3/uL (ref 150–440)
RBC: 3.85 10*6/uL (ref 3.80–5.20)
RDW: 13.5 % (ref 11.5–14.5)
WBC: 8.3 10*3/uL (ref 3.6–11.0)

## 2014-07-26 ENCOUNTER — Other Ambulatory Visit (HOSPITAL_COMMUNITY): Payer: Self-pay | Admitting: Obstetrics and Gynecology

## 2014-07-26 DIAGNOSIS — Z1231 Encounter for screening mammogram for malignant neoplasm of breast: Secondary | ICD-10-CM

## 2014-08-03 ENCOUNTER — Ambulatory Visit (HOSPITAL_COMMUNITY)
Admission: RE | Admit: 2014-08-03 | Discharge: 2014-08-03 | Disposition: A | Discharge status: Home / Self Care | Payer: BC Managed Care – PPO | Source: Ambulatory Visit | Attending: Obstetrics and Gynecology | Admitting: Obstetrics and Gynecology

## 2014-08-03 DIAGNOSIS — Z1231 Encounter for screening mammogram for malignant neoplasm of breast: Secondary | ICD-10-CM

## 2014-08-20 ENCOUNTER — Encounter: Payer: Self-pay | Admitting: Family Medicine

## 2014-08-20 ENCOUNTER — Ambulatory Visit (INDEPENDENT_AMBULATORY_CARE_PROVIDER_SITE_OTHER): Payer: BC Managed Care – PPO | Admitting: Family Medicine

## 2014-08-20 VITALS — BP 116/60 | HR 94 | Temp 98.6°F | Ht 64.25 in | Wt 138.8 lb

## 2014-08-20 DIAGNOSIS — B3731 Acute candidiasis of vulva and vagina: Secondary | ICD-10-CM

## 2014-08-20 DIAGNOSIS — R42 Dizziness and giddiness: Secondary | ICD-10-CM | POA: Insufficient documentation

## 2014-08-20 DIAGNOSIS — M542 Cervicalgia: Secondary | ICD-10-CM

## 2014-08-20 DIAGNOSIS — F430 Acute stress reaction: Secondary | ICD-10-CM

## 2014-08-20 DIAGNOSIS — B373 Candidiasis of vulva and vagina: Secondary | ICD-10-CM

## 2014-08-20 MED ORDER — METHOCARBAMOL 500 MG PO TABS: 500.0000 mg | ORAL_TABLET | Freq: Three times a day (TID) | ORAL | Status: DC | PRN

## 2014-08-20 MED ORDER — FLUCONAZOLE 150 MG PO TABS: 150.0000 mg | ORAL_TABLET | Freq: Once | ORAL | Status: DC

## 2014-08-20 NOTE — Patient Instructions (Signed)
Stop at check out for referral to orthopedics See counselor as planned  Take diflucan for presumed yeast infection-follow up if not improved Try robaxin (muscle relaxer) for neck/shoulder pain and headache    Follow up with Dr Deborra Medina after you start couseling

## 2014-08-20 NOTE — Progress Notes (Signed)
Pre visit review using our clinic review tool, if applicable. No additional management support is needed unless otherwise documented below in the visit note. 

## 2014-08-20 NOTE — Progress Notes (Signed)
Subjective:    Patient ID: Danielle Strickland, female    DOB: 01-22-1966, 48 y.o.   MRN: [id number]  HPI Neck and R shoulder are very painful  Has hx of disc dz in her neck in the past (dx over 10 y ago with orthopedics )  A lot of heavy lifting  This gives her a headache  Uncomfortable to lie on R side  Helps to lift her arm up over her head  She is looking for another job - will apply at [employer]   Has had injections in neck in the past that did help  Notices dizziness occasionally  Poss positional - hangs on to a cart or table when it happens -has never passed out or fallen  She has blamed stress in the past   Has a counselor set up to start therapy soon - hopes that will help   She separated from her husband a month ago  Is back together with him and she has been sexually active  She always gets a yeast infection after sex - and needs diflucan  No discharge   Does not desire STD tests   Her weight is down 10 lb -- she has no appetite - with stress  She forces herself to eat meals   Works 10 1/2 hour days -- she does a lot of heavy lifting -- in cooking /working with heavy equipment  repedative and difficult Has a hx of a bulging disc   Patient Active Problem List   Diagnosis Date Noted  . Neck pain 08/20/2014  . Stress reaction 08/20/2014  . Vaginal yeast infection 08/20/2014  . Dizziness 08/20/2014  . Allergic reaction to insect sting 05/01/2013  . Otalgia of right ear 03/30/2013  . Sore throat 03/30/2013  . Family history of colon cancer 02/21/2013  . TMJ (dislocation of temporomandibular joint) 02/21/2013  . Tobacco abuse 02/21/2013  . Allergy   . Skin cancer   . Peptic ulcer disease   . Generalized anxiety disorder   . Herpes simplex without mention of complication   . IBS (irritable bowel syndrome)   . History of migraine    Past Medical History  Diagnosis Date  . HPV in female   . Abnormal Pap smear of vagina and vaginal HPV     ascus   . Migraines    . Microscopic hematuria   . Mental disorder     anxiety  . Endometriosis   . 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 GERD (gastroesophageal reflux disease)   . IBS (irritable bowel syndrome)   . Liver disease     H/O hepatitis  . Allergy   . Skin cancer     left thigh  . Peptic ulcer disease   . History of migraine    Past Surgical History  Procedure Laterality Date  . Laparoscopically assisted vaginal hysterectomy  2006  . Tubal ligation    . Wisdom tooth extraction    . Salivary gland surgery     History  Substance Use Topics  . Smoking status: Current Every Day Smoker -- 1.00 packs/day    Types: Cigarettes  . Smokeless tobacco: Never Used  . Alcohol Use: No   Family History  Problem Relation Age of Onset  . Cancer Mother     colon   . Thyroid cancer Maternal Aunt   . Breast cancer Maternal Grandmother   . Breast cancer Maternal Aunt    Allergies  Allergen Reactions  . Augmentin [Amoxicillin-Pot Clavulanate]   . Codeine   . Latex    Current Outpatient Prescriptions on File Prior to Visit  Medication Sig Dispense Refill  . NAPROXEN PO Take 550 mg by mouth as needed.       . promethazine (PHENERGAN) 25 MG tablet Take 1 tablet (25 mg total) by mouth every 6 (six) hours as needed.  30 tablet  2  . SUMAtriptan (IMITREX) 50 MG tablet Take 50 mg by mouth every 2 (two) hours as needed.       No current facility-administered medications on file prior to visit.    Review of Systems    Review of Systems  Constitutional: Negative for fever, appetite change,  and unexpected weight change.  Eyes: Negative for pain and visual disturbance.  Respiratory: Negative for cough and shortness of breath.   Cardiovascular: Negative for cp or palpitations    Gastrointestinal: Negative for nausea, diarrhea and constipation.  Genitourinary: Negative for urgency and frequency.  Skin: Negative for pallor or rash   Neurological: Negative for weakness, numbness  and headaches. pos for positional dizziness that is fleeting  MSk pos for neck pain rad to R shoulder  Hematological: Negative for adenopathy. Does not bruise/bleed easily.  Psychiatric/Behavioral: Negative for dysphoric mood. The patient is not nervous/anxious.      Objective:   Physical Exam  Constitutional: She appears well-developed and well-nourished. No distress.  HENT:  Head: Normocephalic and atraumatic.  Right Ear: External ear normal.  Left Ear: External ear normal.  Nose: Nose normal.  Mouth/Throat: Oropharynx is clear and moist.  No sinus or temporal tenderness   Eyes: Conjunctivae and EOM are normal. Pupils are equal, round, and reactive to light. Right eye exhibits no discharge. Left eye exhibits no discharge. No scleral icterus.  No nystagmus   Neck: Neck supple. No JVD present. No thyromegaly present.  Pain to fully flex/ext neck Pain to rot right   Bony tenderness in lower CS  Some R trapezius tenderness   Cardiovascular: Normal rate, regular rhythm, normal heart sounds and intact distal pulses.  Exam reveals no gallop.   Pulmonary/Chest: Effort normal and breath sounds normal. No respiratory distress. She has no wheezes. She has no rales.  Abdominal: Soft. Bowel sounds are normal. She exhibits no distension and no mass. There is no tenderness.  Musculoskeletal: She exhibits tenderness. She exhibits no edema.  Some tenderness med to R scapula  Some mild R acromion tenderness  Nl rom arm  Neg hawking/ neer tests  Nl int/ext rot of shoulder   (mild discomfort with int rotation)  Lymphadenopathy:    She has no cervical adenopathy.  Neurological: She is alert. She has normal strength and normal reflexes. She displays no atrophy and no tremor. No cranial nerve deficit or sensory deficit. She exhibits normal muscle tone. She displays a negative Romberg sign. Coordination and gait normal.  No focal cerebellar signs   Skin: Skin is warm and dry. No rash noted. No  erythema. No pallor.  Psychiatric: Her speech is normal and behavior is normal. Thought content normal. Her mood appears anxious. Her affect is not blunt and not labile. She does not exhibit a depressed mood.          Assessment & Plan:   Problem List Items Addressed This Visit     Genitourinary   Vaginal yeast infection     Pt asks for diflucan tx  Declines std tests     Relevant Medications  valACYclovir (VALTREX) 500 MG tablet      fluconazole (DIFLUCAN) tablet 150 mg     Other   Neck pain - Primary     This is ongoing/ with hx of injections in the past for deg disc dz per pt  Wishes f/u with orthopedics-ref done  Symptoms are ref to the shoulder (R) as well -no neuro symptoms however     Relevant Orders      Ambulatory referral to Orthopedic Surgery   Stress reaction     Reviewed stressors/ coping techniques/symptoms/ support sources/ tx options and side effects in detail today  Long disc re; this  She will pursue counseling  Then talk to her PCP about medication if needed  She thinks this may cause dizziness     Dizziness     Suspect mild positional vertigo  Although stress also brings it on  Disc tx options for ETD/ vertigo incl PT  Disc beginning therapy for her anxiety/stress reaction

## 2014-08-21 ENCOUNTER — Telehealth: Payer: Self-pay | Admitting: Family Medicine

## 2014-08-21 NOTE — Telephone Encounter (Signed)
emmi emailed °

## 2014-08-22 NOTE — Assessment & Plan Note (Signed)
Suspect mild positional vertigo  Although stress also brings it on  Disc tx options for ETD/ vertigo incl PT  Disc beginning therapy for her anxiety/stress reaction

## 2014-08-22 NOTE — Assessment & Plan Note (Signed)
Reviewed stressors/ coping techniques/symptoms/ support sources/ tx options and side effects in detail today  Long disc re; this  She will pursue counseling  Then talk to her PCP about medication if needed  She thinks this may cause dizziness

## 2014-08-22 NOTE — Assessment & Plan Note (Signed)
This is ongoing/ with hx of injections in the past for deg disc dz per pt  Wishes f/u with orthopedics-ref done  Symptoms are ref to the shoulder (R) as well -no neuro symptoms however

## 2014-08-22 NOTE — Assessment & Plan Note (Signed)
Pt asks for diflucan tx  Declines std tests

## 2014-08-27 ENCOUNTER — Encounter: Payer: Self-pay | Admitting: Family Medicine

## 2014-09-03 ENCOUNTER — Telehealth: Payer: Self-pay | Admitting: Family Medicine

## 2014-09-03 NOTE — Telephone Encounter (Signed)
Pt stated dob September 29, 2066

## 2014-09-03 NOTE — Telephone Encounter (Signed)
Left message asking pt to call office please confirm dob We have 07-04-2064 [facility] has 11/17/54

## 2015-01-22 ENCOUNTER — Ambulatory Visit: Payer: Self-pay | Admitting: Gastroenterology

## 2015-01-22 LAB — HM COLONOSCOPY
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
## 2015-02-16 NOTE — H&P (Signed)
PATIENT NAME:  Danielle Strickland, WADLE MR#:  [id number] DATE OF BIRTH:  04-Jan-1966  DATE OF ADMISSION:  01/11/2014  ADMITTING DIAGNOSES: Abdominal pain, possible acute cholecystitis.   HISTORY: This is a 49 year old white female, who earlier this week had a barbecue sandwich  and french fries at  a lunch function at her job, developed severe epigastric abdominal pain which she describes as a tearing sensation with radiation into her back associated with chills and nausea, but no vomiting. She was then instructed by the work nurse to go to the Emergency Room. She was given intravenous narcotics and Zofran. A workup consisted of a negative urinalysis, normal white count, normal liver function tests, normal EKG. CT scan of the abdomen demonstrating a distended gallbladder; ultrasound demonstrating no gallstones and no sonographic evidence of acute cholecystitis with bile duct measuring 4.9 mm. There was no evidence of pericholecystic fluid or gallbladder wall thickening. The patient had some relief in the Emergency Room and then was sent home to follow up with [place] in the office. Over the last 2 days, the patient has had persistent, constant epigastric and right upper quadrant abdominal pain, again radiating to her back with persistent nausea. She has not been able to eat much. She had a cracker earlier today. She smokes 1 pack of cigarettes per day. Does not have any alcohol use.   PAST SURGICAL HISTORY: Significant for a laparoscopic-assisted hysterectomy, bilateral tubal ligation, ruptured appendicitis in 1981 and removal of right submandibular stone requiring mandibular gland excision. Last month, she had a 24-hour history of an upper GI bug, which resolved. In the past, she had an upper endoscopy and lower endoscopy significant for ulcer disease. No records are available to me at this time.   ALLERGIES: Augmentin, latex and codeine.  PAST MEDICAL HISTORY: As described above.   PAST SURGICAL HISTORY: As  described above.   SOCIAL HISTORY: She is married, employed, 1 pack a day of cigarettes. No alcohol use. No drug use.   REVIEW OF SYSTEMS: As described above.   PHYSICAL EXAMINATION: GENERAL: The patient is alert and oriented  LUNGS: Clear.  HEENT:  Sclerae are anicteric.  HEART: Regular rate and rhythm.  ABDOMEN: Mildly distended, significantly tender in the epigastric and right upper quadrant consistent with a positive Murphy sign. There are multiple scars. No obvious herniae.  EXTREMITIES: Warm and well perfused.  NEUROLOGIC: Alert and oriented, nonfocal.  VITAL SIGNS: Temperature is 98.2, pulse of 89. Blood pressure is 113/64. She is 145 pounds, 64 inches high, 25 BMI.   Review of x-rays is as described above.   IMPRESSION: This is a 49 year old white female with nearly 3 days of acute epigastric and right upper quadrant abdominal pain concerning for cholecystitis. There is a lack of findings seen on the CT scan and ultrasound consistent with stones.  Physical examination is concerning for a gallbladder process.   PLAN: The patient will be admitted, hydrated, made n.p.o. after midnight and HIDA scan will be performed. I discussed this with Dr. Burt Knack by telephone who will assume her care upon her arrival in the Emergency Room. She is in agreement with this plan along with her husband.    ____________________________ Jeannette How. Marina Gravel, MD mab:dmm D: 01/11/2014 17:09:04 ET T: 01/11/2014 19:14:15 ET JOB#: [id number]  cc: Elta Guadeloupe A. Marina Gravel, MD, <Dictator> Hortencia Conradi MD ELECTRONICALLY SIGNED 01/12/2014 6:41

## 2015-02-16 NOTE — Consult Note (Signed)
Brief Consult Note: Diagnosis: abdominal pain, ?ulcer.   Patient was seen by consultant.   Comments: Danielle Strickland is a pleasant 49 y/o caucasian female with hx chronic GERD & chronic constipation who presented with acute epigastric pain, nausea & vomiting.  She can go up to 40 days without a BM.  She has not been taking medications for GERD or constipation.  Colonoscopy is up to date with family hx colon cancer & personal hx of polyps, previous pt of Dr Watt Climes.  Her symptoms are not typical for biliary pain and ultrasound & HIDA are benign.  CT shows significant stool in colon.  I suspect her symptoms are due to acute food borne illness, gastritis, GERD & chronic constipation with some functional overlay.  Plan: 1) May DC home today 2) Begin Linzess 257mcg daily for constipation.  Pt may pick up samples #20 at my office. 3) Protonix 40mg  daily 4) Follow up in my office in 10 days or sooner if worse 5) 1-800-QUIT-NOW Thanks for allowing [place] to participate in her care.  Please see full dictated note. #[id number].  Electronic Signatures: Andria Meuse (NP)  (Signed 20-Mar-15 17:06)  Authored: Brief Consult Note   Last Updated: 20-Mar-15 17:06 by Andria Meuse (NP)

## 2015-02-18 LAB — SURGICAL PATHOLOGY

## 2015-03-13 ENCOUNTER — Encounter: Payer: Self-pay | Admitting: Family Medicine

## 2015-03-20 ENCOUNTER — Encounter: Payer: Self-pay | Admitting: Family Medicine

## 2015-05-11 ENCOUNTER — Emergency Department: Payer: BLUE CROSS/BLUE SHIELD

## 2015-05-11 ENCOUNTER — Emergency Department
Admission: EM | Admit: 2015-05-11 | Discharge: 2015-05-11 | Disposition: A | Discharge status: Home / Self Care | Payer: BLUE CROSS/BLUE SHIELD | Attending: Emergency Medicine | Admitting: Emergency Medicine

## 2015-05-11 ENCOUNTER — Encounter: Payer: Self-pay | Admitting: *Deleted

## 2015-05-11 DIAGNOSIS — Y998 Other external cause status: Secondary | ICD-10-CM | POA: Insufficient documentation

## 2015-05-11 DIAGNOSIS — Y92410 Unspecified street and highway as the place of occurrence of the external cause: Secondary | ICD-10-CM | POA: Insufficient documentation

## 2015-05-11 DIAGNOSIS — Z91040 Latex allergy status: Secondary | ICD-10-CM | POA: Insufficient documentation

## 2015-05-11 DIAGNOSIS — T148 Other injury of unspecified body region: Secondary | ICD-10-CM | POA: Diagnosis not present

## 2015-05-11 DIAGNOSIS — Z720 Tobacco use: Secondary | ICD-10-CM | POA: Insufficient documentation

## 2015-05-11 DIAGNOSIS — Z880 Allergy status to penicillin: Secondary | ICD-10-CM | POA: Insufficient documentation

## 2015-05-11 DIAGNOSIS — S299XXA Unspecified injury of thorax, initial encounter: Secondary | ICD-10-CM | POA: Diagnosis not present

## 2015-05-11 DIAGNOSIS — S161XXA Strain of muscle, fascia and tendon at neck level, initial encounter: Secondary | ICD-10-CM | POA: Diagnosis not present

## 2015-05-11 DIAGNOSIS — S99929A Unspecified injury of unspecified foot, initial encounter: Secondary | ICD-10-CM | POA: Insufficient documentation

## 2015-05-11 DIAGNOSIS — S93401A Sprain of unspecified ligament of right ankle, initial encounter: Secondary | ICD-10-CM

## 2015-05-11 DIAGNOSIS — S199XXA Unspecified injury of neck, initial encounter: Secondary | ICD-10-CM | POA: Diagnosis present

## 2015-05-11 DIAGNOSIS — T148XXA Other injury of unspecified body region, initial encounter: Secondary | ICD-10-CM

## 2015-05-11 DIAGNOSIS — Y9389 Activity, other specified: Secondary | ICD-10-CM | POA: Diagnosis not present

## 2015-05-11 DIAGNOSIS — S8990XA Unspecified injury of unspecified lower leg, initial encounter: Secondary | ICD-10-CM | POA: Insufficient documentation

## 2015-05-11 LAB — CBC WITH DIFFERENTIAL/PLATELET
BASOS PCT: 0 %
Basophils Absolute: 0 10*3/uL (ref 0–0.1)
EOS ABS: 0.3 10*3/uL (ref 0–0.7)
Eosinophils Relative: 3 %
HEMATOCRIT: 34.8 % — AB (ref 35.0–47.0)
HEMOGLOBIN: 12.2 g/dL (ref 12.0–16.0)
Lymphocytes Relative: 31 %
Lymphs Abs: 2.6 10*3/uL (ref 1.0–3.6)
MCH: 33 pg (ref 26.0–34.0)
MCHC: 35.1 g/dL (ref 32.0–36.0)
MCV: 94 fL (ref 80.0–100.0)
MONO ABS: 0.5 10*3/uL (ref 0.2–0.9)
Monocytes Relative: 7 %
Neutro Abs: 4.8 10*3/uL (ref 1.4–6.5)
Neutrophils Relative %: 59 %
Platelets: 273 10*3/uL (ref 150–440)
RBC: 3.71 MIL/uL — ABNORMAL LOW (ref 3.80–5.20)
RDW: 12.5 % (ref 11.5–14.5)
WBC: 8.2 10*3/uL (ref 3.6–11.0)

## 2015-05-11 LAB — COMPREHENSIVE METABOLIC PANEL
ALK PHOS: 69 U/L (ref 38–126)
ALT: 18 U/L (ref 14–54)
ANION GAP: 9 (ref 5–15)
AST: 26 U/L (ref 15–41)
Albumin: 4.1 g/dL (ref 3.5–5.0)
BUN: 12 mg/dL (ref 6–20)
CO2: 25 mmol/L (ref 22–32)
Calcium: 9 mg/dL (ref 8.9–10.3)
Chloride: 102 mmol/L (ref 101–111)
Creatinine, Ser: 0.69 mg/dL (ref 0.44–1.00)
GLUCOSE: 102 mg/dL — AB (ref 65–99)
POTASSIUM: 3.5 mmol/L (ref 3.5–5.1)
SODIUM: 136 mmol/L (ref 135–145)
Total Bilirubin: 0.4 mg/dL (ref 0.3–1.2)
Total Protein: 7.3 g/dL (ref 6.5–8.1)

## 2015-05-11 MED ORDER — DIAZEPAM 5 MG PO TABS: 5.0000 mg | ORAL_TABLET | Freq: Three times a day (TID) | ORAL | Status: DC | PRN

## 2015-05-11 MED ORDER — MORPHINE SULFATE 4 MG/ML IJ SOLN
4.0000 mg | Freq: Once | INTRAMUSCULAR | Status: AC
  Administered 2015-05-11: 4 mg via INTRAVENOUS
  Filled 2015-05-11: qty 1

## 2015-05-11 MED ORDER — DIAZEPAM 5 MG/ML IJ SOLN
5.0000 mg | Freq: Once | INTRAMUSCULAR | Status: AC
Start: 2015-05-11 — End: 2015-05-11
  Administered 2015-05-11: 5 mg via INTRAVENOUS
  Filled 2015-05-11: qty 2

## 2015-05-11 MED ORDER — IBUPROFEN 800 MG PO TABS: 800.0000 mg | ORAL_TABLET | Freq: Three times a day (TID) | ORAL | Status: DC | PRN

## 2015-05-11 MED ORDER — KETOROLAC TROMETHAMINE 30 MG/ML IJ SOLN
30.0000 mg | Freq: Once | INTRAMUSCULAR | Status: AC
  Administered 2015-05-11: 30 mg via INTRAVENOUS
  Filled 2015-05-11: qty 1

## 2015-05-11 NOTE — ED Notes (Signed)
Discharge completed and all questions answered. Pt ids aware of follow up with pcp.all papers given. Pt denies questions.

## 2015-05-11 NOTE — ED Notes (Signed)
md has been back to bedside to discuss paln.
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
## 2015-05-11 NOTE — ED Notes (Signed)
Transported to radiology 

## 2015-05-11 NOTE — ED Notes (Signed)
Pt returned from radiology at this time. Pt in nad at this time. [place] police at bedside

## 2015-05-11 NOTE — ED Notes (Signed)
276ml cola to bedside per pt request. Family at bedside
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
## 2015-05-13 ENCOUNTER — Telehealth: Payer: Self-pay | Admitting: Family Medicine

## 2015-05-13 NOTE — Telephone Encounter (Signed)
Pt has appt with DR Tower on 05/14/15 at 10:15 AM.
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
## 2015-05-13 NOTE — Telephone Encounter (Signed)
Will see her then 

## 2015-05-14 ENCOUNTER — Telehealth: Payer: Self-pay | Admitting: *Deleted

## 2015-05-14 ENCOUNTER — Encounter: Payer: Self-pay | Admitting: Family Medicine

## 2015-05-14 ENCOUNTER — Ambulatory Visit: Payer: BLUE CROSS/BLUE SHIELD | Admitting: Internal Medicine

## 2015-05-14 ENCOUNTER — Ambulatory Visit (INDEPENDENT_AMBULATORY_CARE_PROVIDER_SITE_OTHER)
Admission: RE | Admit: 2015-05-14 | Discharge: 2015-05-14 | Disposition: A | Discharge status: Home / Self Care | Payer: BLUE CROSS/BLUE SHIELD | Source: Ambulatory Visit | Attending: Family Medicine | Admitting: Family Medicine

## 2015-05-14 ENCOUNTER — Ambulatory Visit (INDEPENDENT_AMBULATORY_CARE_PROVIDER_SITE_OTHER): Payer: BLUE CROSS/BLUE SHIELD | Admitting: Family Medicine

## 2015-05-14 DIAGNOSIS — S20219A Contusion of unspecified front wall of thorax, initial encounter: Secondary | ICD-10-CM

## 2015-05-14 DIAGNOSIS — S93401A Sprain of unspecified ligament of right ankle, initial encounter: Secondary | ICD-10-CM | POA: Diagnosis not present

## 2015-05-14 DIAGNOSIS — S139XXA Sprain of joints and ligaments of unspecified parts of neck, initial encounter: Secondary | ICD-10-CM

## 2015-05-14 MED ORDER — AZITHROMYCIN 250 MG PO TABS: ORAL_TABLET | ORAL | Status: DC

## 2015-05-14 NOTE — Telephone Encounter (Signed)
-----   Message from Abner Greenspan, MD sent at 05/14/2015  1:20 PM EDT ----- See Deloris Ping note  Please make sure pt saw it and send in zpak to her pharmacy  zpak take po as directed (2 po today and 1 pill daily for 4 d)   # 1 pk no ref
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
## 2015-05-14 NOTE — Progress Notes (Signed)
Pre visit review using our clinic review tool, if applicable. No additional management support is needed unless otherwise documented below in the visit note. 
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
## 2015-05-14 NOTE — Telephone Encounter (Signed)
Rx sent to pharmacy and pt notified of lab results and Dr. Marliss Coots note on [place]

## 2015-05-14 NOTE — Patient Instructions (Signed)
Stop at check out for referral to PT  Chest xray now  Continue heat and current medications  Make an appointment for follow up with Dr Lorelei Pont for your ankle when able    Follow up with Dr Deborra Medina early next week

## 2015-05-15 ENCOUNTER — Telehealth: Payer: Self-pay | Admitting: *Deleted

## 2015-05-15 NOTE — Telephone Encounter (Signed)
Pt viewed x-ray results on mychart and wanted me to ask you which lung has the Mild lingular subsegmental atelectasis, her lawyer is getting a copy of her results and he asked her which one it was, please advise

## 2015-05-15 NOTE — Telephone Encounter (Signed)
The lingula is in the left lung
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
## 2015-05-20 ENCOUNTER — Ambulatory Visit (INDEPENDENT_AMBULATORY_CARE_PROVIDER_SITE_OTHER): Payer: BLUE CROSS/BLUE SHIELD | Admitting: Family Medicine

## 2015-05-20 ENCOUNTER — Encounter: Payer: Self-pay | Admitting: Family Medicine

## 2015-05-20 DIAGNOSIS — S20219A Contusion of unspecified front wall of thorax, initial encounter: Secondary | ICD-10-CM

## 2015-05-20 DIAGNOSIS — M542 Cervicalgia: Secondary | ICD-10-CM

## 2015-05-20 DIAGNOSIS — S93401A Sprain of unspecified ligament of right ankle, initial encounter: Secondary | ICD-10-CM

## 2015-05-20 DIAGNOSIS — F430 Acute stress reaction: Secondary | ICD-10-CM

## 2015-05-20 MED ORDER — IBUPROFEN 800 MG PO TABS: 800.0000 mg | ORAL_TABLET | Freq: Three times a day (TID) | ORAL | Status: DC | PRN

## 2015-05-20 MED ORDER — DIAZEPAM 5 MG PO TABS
5.0000 mg | ORAL_TABLET | Freq: Three times a day (TID) | ORAL | Status: DC | PRN
Start: 2015-05-20 — End: 2015-06-10

## 2015-05-20 NOTE — Assessment & Plan Note (Signed)
Swelling is improved Pt can bear partial weight  Will have her see Dr Lorelei Pont for further eval  Enc ice and elevation when able

## 2015-05-20 NOTE — Assessment & Plan Note (Signed)
rom slt improved To start PT tomorrow

## 2015-05-20 NOTE — Assessment & Plan Note (Signed)
Pt continues to have pain  Did take zpack for atelectasis seen on cxr  No rales heard on exam  Expect the discomfort/sternal contusion will take time to heal Recommend heat/ice  Also deep breaths when able to prevent further atelectasis
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
## 2015-05-20 NOTE — Patient Instructions (Addendum)
Make an appt with Dr Lorelei Pont at check out  Follow up with Dr Deborra Medina in about a week  Start physical therapy as planned  Stop at check out for referral to counseling
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
## 2015-05-20 NOTE — Assessment & Plan Note (Signed)
Starts PT tomorrow  Will schedule with Dr Lorelei Pont for R ankle sprain CW contusion is still quite painful Ref to psychology for counseling for stress reaction   Letter to continue out of work until Aug 5th - pending re eval by her PCP - to f/u next week FMLA forms also filled out today

## 2015-05-20 NOTE — Progress Notes (Signed)
Pre visit review using our clinic review tool, if applicable. No additional management support is needed unless otherwise documented below in the visit note. 

## 2015-05-21 ENCOUNTER — Ambulatory Visit: Payer: BLUE CROSS/BLUE SHIELD | Attending: Family Medicine

## 2015-05-21 ENCOUNTER — Telehealth: Payer: Self-pay | Admitting: *Deleted

## 2015-05-21 DIAGNOSIS — M7580 Other shoulder lesions, unspecified shoulder: Secondary | ICD-10-CM | POA: Diagnosis not present

## 2015-05-21 DIAGNOSIS — R29898 Other symptoms and signs involving the musculoskeletal system: Secondary | ICD-10-CM | POA: Diagnosis not present

## 2015-05-21 DIAGNOSIS — M542 Cervicalgia: Secondary | ICD-10-CM | POA: Diagnosis not present

## 2015-05-21 DIAGNOSIS — M25619 Stiffness of unspecified shoulder, not elsewhere classified: Secondary | ICD-10-CM

## 2015-05-21 NOTE — Telephone Encounter (Signed)
Left voicemail requesting pt to call office, Dr. Glori Bickers filled out FMLA forms and one the form it says return to pt (not fax to them) so I need to see if pt wants to pick up forms or does she still want [place] to fax it (per Dr. Marliss Coots note on form)

## 2015-05-21 NOTE — Patient Instructions (Signed)
[email or web address] Cervical retractions in supine x10 Shoulder flexion with wand 2x10 in supine Cervical rotations with UE assist in supine x10 each

## 2015-05-21 NOTE — Telephone Encounter (Signed)
Pt returned your call, please call back at [phone]
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
## 2015-05-21 NOTE — Telephone Encounter (Signed)
Pt will pick up forms directly and give them to her HR person in charge of FMLA

## 2015-05-22 ENCOUNTER — Telehealth: Payer: Self-pay

## 2015-05-22 NOTE — Telephone Encounter (Signed)
Claudia with [employer] disability claims left v/m requesting how long pt is expected to be out of work due to rt foot sprain. Pt seen 05/20/15.Please advise.

## 2015-05-22 NOTE — Telephone Encounter (Signed)
She has an appt with sport med on Aug 1 -will know more after that  I tentatively said until after Aug 5th to start

## 2015-05-22 NOTE — Telephone Encounter (Signed)
[place] notified of Dr. Marliss Coots comments. Rosemarie Ax wanted me to ask you what is pt's exact diagnosis

## 2015-05-22 NOTE — Telephone Encounter (Signed)
Ankle sprain (for now) - however she will see a sport med specialist on Monday and likely get a more specific diagnosis at that time

## 2015-05-23 NOTE — Telephone Encounter (Signed)
Left voicemail letting Danielle Strickland know the diagnosis of Ankle Sprain for now until she sees sport med doc

## 2015-05-27 ENCOUNTER — Ambulatory Visit (INDEPENDENT_AMBULATORY_CARE_PROVIDER_SITE_OTHER)
Admission: RE | Admit: 2015-05-27 | Discharge: 2015-05-27 | Disposition: A | Discharge status: Home / Self Care | Payer: BLUE CROSS/BLUE SHIELD | Source: Ambulatory Visit | Attending: Family Medicine | Admitting: Family Medicine

## 2015-05-27 ENCOUNTER — Ambulatory Visit (INDEPENDENT_AMBULATORY_CARE_PROVIDER_SITE_OTHER): Payer: BLUE CROSS/BLUE SHIELD | Admitting: Family Medicine

## 2015-05-27 ENCOUNTER — Encounter: Payer: Self-pay | Admitting: Family Medicine

## 2015-05-27 ENCOUNTER — Ambulatory Visit
Admission: RE | Admit: 2015-05-27 | Discharge: 2015-05-27 | Disposition: A | Discharge status: Home / Self Care | Payer: BLUE CROSS/BLUE SHIELD | Source: Ambulatory Visit | Attending: Family Medicine | Admitting: Family Medicine

## 2015-05-27 VITALS — BP 96/60 | HR 91 | Temp 98.5°F | Ht 64.25 in | Wt 151.2 lb

## 2015-05-27 DIAGNOSIS — S8251XA Displaced fracture of medial malleolus of right tibia, initial encounter for closed fracture: Secondary | ICD-10-CM | POA: Diagnosis not present

## 2015-05-27 DIAGNOSIS — M79671 Pain in right foot: Secondary | ICD-10-CM

## 2015-05-27 DIAGNOSIS — T148XXA Other injury of unspecified body region, initial encounter: Secondary | ICD-10-CM

## 2015-05-27 DIAGNOSIS — M25571 Pain in right ankle and joints of right foot: Secondary | ICD-10-CM | POA: Diagnosis not present

## 2015-05-27 DIAGNOSIS — M79672 Pain in left foot: Secondary | ICD-10-CM

## 2015-05-27 DIAGNOSIS — T148 Other injury of unspecified body region: Secondary | ICD-10-CM
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
## 2015-05-27 NOTE — Progress Notes (Signed)
Pre visit review using our clinic review tool, if applicable. No additional management support is needed unless otherwise documented below in the visit note. 

## 2015-05-27 NOTE — Patient Instructions (Signed)
1. Nonweightbearing until I tell you it is ok (Crutches) 2. F/u 2 weeks

## 2015-05-29 ENCOUNTER — Encounter: Payer: Self-pay | Admitting: Family Medicine

## 2015-05-29 ENCOUNTER — Ambulatory Visit (INDEPENDENT_AMBULATORY_CARE_PROVIDER_SITE_OTHER): Payer: BLUE CROSS/BLUE SHIELD | Admitting: Family Medicine

## 2015-05-29 ENCOUNTER — Ambulatory Visit: Payer: BLUE CROSS/BLUE SHIELD | Attending: Family Medicine

## 2015-05-29 DIAGNOSIS — M25619 Stiffness of unspecified shoulder, not elsewhere classified: Secondary | ICD-10-CM

## 2015-05-29 DIAGNOSIS — M7580 Other shoulder lesions, unspecified shoulder: Secondary | ICD-10-CM | POA: Insufficient documentation

## 2015-05-29 DIAGNOSIS — S20219A Contusion of unspecified front wall of thorax, initial encounter: Secondary | ICD-10-CM

## 2015-05-29 DIAGNOSIS — R29898 Other symptoms and signs involving the musculoskeletal system: Secondary | ICD-10-CM | POA: Insufficient documentation

## 2015-05-29 DIAGNOSIS — S139XXS Sprain of joints and ligaments of unspecified parts of neck, sequela: Secondary | ICD-10-CM

## 2015-05-29 DIAGNOSIS — R0602 Shortness of breath: Secondary | ICD-10-CM | POA: Insufficient documentation

## 2015-05-29 DIAGNOSIS — S82891A Other fracture of right lower leg, initial encounter for closed fracture: Secondary | ICD-10-CM | POA: Insufficient documentation

## 2015-05-29 DIAGNOSIS — M542 Cervicalgia: Secondary | ICD-10-CM | POA: Diagnosis not present

## 2015-05-29 DIAGNOSIS — R9389 Abnormal findings on diagnostic imaging of other specified body structures: Secondary | ICD-10-CM

## 2015-05-29 DIAGNOSIS — R079 Chest pain, unspecified: Secondary | ICD-10-CM | POA: Insufficient documentation

## 2015-05-29 NOTE — Assessment & Plan Note (Signed)
With persistent SOB and chest wall pain. Could certainly be from chest wall contusion but [place] is very concerned about this. CT of chest ordered for further evaluation.
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
## 2015-05-29 NOTE — Assessment & Plan Note (Signed)
Continue PT, Ibuprofen, tylenol and prn valium. Follow up with me in 2-4 weeks.
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
## 2015-05-29 NOTE — Patient Instructions (Signed)
Good to see you. Please stop by to see Marion on your way out. 

## 2015-05-29 NOTE — Progress Notes (Signed)
Pre visit review using our clinic review tool, if applicable. No additional management support is needed unless otherwise documented below in the visit note. 

## 2015-05-29 NOTE — Assessment & Plan Note (Signed)
New- has been followed by Dr. Lorelei Pont. In boot for immobilization. has follow up with him in 2 weeks.

## 2015-05-31 ENCOUNTER — Telehealth: Payer: Self-pay | Admitting: Family Medicine

## 2015-05-31 NOTE — Telephone Encounter (Signed)
Pt's sister brought in some ppw that needs to be filled out for her work. The best number to contact pt back at is [phone]. Placing ppw in Dr. Hulen Shouts inbox.

## 2015-06-03 ENCOUNTER — Encounter: Payer: Self-pay | Admitting: Family Medicine

## 2015-06-03 ENCOUNTER — Telehealth: Payer: Self-pay | Admitting: Family Medicine

## 2015-06-03 ENCOUNTER — Ambulatory Visit
Admission: RE | Admit: 2015-06-03 | Discharge: 2015-06-03 | Disposition: A | Discharge status: Home / Self Care | Payer: BLUE CROSS/BLUE SHIELD | Source: Ambulatory Visit | Attending: Family Medicine | Admitting: Family Medicine

## 2015-06-03 ENCOUNTER — Ambulatory Visit: Payer: BLUE CROSS/BLUE SHIELD

## 2015-06-03 DIAGNOSIS — R0602 Shortness of breath: Secondary | ICD-10-CM | POA: Diagnosis present

## 2015-06-03 DIAGNOSIS — R079 Chest pain, unspecified: Secondary | ICD-10-CM | POA: Diagnosis present

## 2015-06-03 DIAGNOSIS — M542 Cervicalgia: Secondary | ICD-10-CM

## 2015-06-03 DIAGNOSIS — R9389 Abnormal findings on diagnostic imaging of other specified body structures: Secondary | ICD-10-CM

## 2015-06-03 DIAGNOSIS — R938 Abnormal findings on diagnostic imaging of other specified body structures: Secondary | ICD-10-CM | POA: Insufficient documentation

## 2015-06-03 NOTE — Telephone Encounter (Signed)
Noted and chart corrected.

## 2015-06-03 NOTE — Telephone Encounter (Signed)
FMLA forms are being requested for Dr. Lorelei Pont to complete. Forms in Dr. Lorelei Pont Inbox for review, completion and signature. Please return to [facility] upon completion.  Thanks
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
## 2015-06-04 DIAGNOSIS — Z7689 Persons encountering health services in other specified circumstances: Secondary | ICD-10-CM

## 2015-06-04 NOTE — Telephone Encounter (Signed)
Pt informed forms are complete. She did not want me to fax them as she will pick them up from the office.  Copy sent to scan Copy for office file Copy to charge

## 2015-06-04 NOTE — Telephone Encounter (Signed)
Pt called and would like return call re: results of her CT.  Her concerns are re: lung findings and breast area.  Best number's to call her today are [phone] or [phone].  She is staying with her sister today and [facility] numbers are good for today only.

## 2015-06-04 NOTE — Telephone Encounter (Signed)
done

## 2015-06-10 ENCOUNTER — Ambulatory Visit (INDEPENDENT_AMBULATORY_CARE_PROVIDER_SITE_OTHER)
Admission: RE | Admit: 2015-06-10 | Discharge: 2015-06-10 | Disposition: A | Discharge status: Home / Self Care | Payer: BLUE CROSS/BLUE SHIELD | Source: Ambulatory Visit | Attending: Family Medicine | Admitting: Family Medicine

## 2015-06-10 ENCOUNTER — Other Ambulatory Visit: Payer: Self-pay | Admitting: Family Medicine

## 2015-06-10 ENCOUNTER — Ambulatory Visit (INDEPENDENT_AMBULATORY_CARE_PROVIDER_SITE_OTHER): Payer: BLUE CROSS/BLUE SHIELD | Admitting: Family Medicine

## 2015-06-10 ENCOUNTER — Encounter: Payer: Self-pay | Admitting: Family Medicine

## 2015-06-10 ENCOUNTER — Telehealth: Payer: Self-pay | Admitting: Family Medicine

## 2015-06-10 VITALS — BP 100/62 | HR 85 | Temp 97.7°F | Ht 64.25 in

## 2015-06-10 DIAGNOSIS — N6011 Diffuse cystic mastopathy of right breast: Secondary | ICD-10-CM

## 2015-06-10 DIAGNOSIS — S82891D Other fracture of right lower leg, subsequent encounter for closed fracture with routine healing: Secondary | ICD-10-CM

## 2015-06-10 DIAGNOSIS — S82891A Other fracture of right lower leg, initial encounter for closed fracture: Secondary | ICD-10-CM | POA: Diagnosis not present

## 2015-06-10 MED ORDER — DIAZEPAM 5 MG PO TABS: 5.0000 mg | ORAL_TABLET | Freq: Three times a day (TID) | ORAL | Status: DC | PRN

## 2015-06-10 NOTE — Telephone Encounter (Signed)
Spoke to pt and answered her questions concerning her chest CT.  She is agreeable to diagnostic mammogram- order entered.  Rosaria Ferries, please call pt to schedule this.

## 2015-06-10 NOTE — Telephone Encounter (Signed)
Appt made at [facility] on 06/14/15 at 1:20pm. [facility]

## 2015-06-10 NOTE — Telephone Encounter (Signed)
Please call in Valium rx to [facility] on [address] as printed- for muscle spasms and insomnia.

## 2015-06-10 NOTE — Telephone Encounter (Signed)
Pt came in today to see dr copland.  She stated she has been trying to get in touch with dr Deborra Medina she has several questions about CT scan results .  Pt feels like no one is concerned about her questions  Please call her back [phone]

## 2015-06-10 NOTE — Progress Notes (Signed)
Pre visit review using our clinic review tool, if applicable. No additional management support is needed unless otherwise documented below in the visit note. 
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
## 2015-06-10 NOTE — Telephone Encounter (Signed)
Rx was printed and signed; faxed to requested pharmacy

## 2015-06-10 NOTE — Telephone Encounter (Signed)
I am not sure what she is referring to.  I corrected chart as she requested on 8/8 and I do not see any other phone notes.  I left voicemail asking patient to return my call.

## 2015-06-14 ENCOUNTER — Ambulatory Visit
Admission: RE | Admit: 2015-06-14 | Discharge: 2015-06-14 | Disposition: A | Discharge status: Home / Self Care | Payer: BLUE CROSS/BLUE SHIELD | Source: Ambulatory Visit | Attending: Family Medicine | Admitting: Family Medicine

## 2015-06-14 ENCOUNTER — Other Ambulatory Visit: Payer: Self-pay | Admitting: Family Medicine

## 2015-06-14 DIAGNOSIS — N6011 Diffuse cystic mastopathy of right breast: Secondary | ICD-10-CM

## 2015-06-18 ENCOUNTER — Ambulatory Visit: Payer: BLUE CROSS/BLUE SHIELD

## 2015-06-18 DIAGNOSIS — Z8614 Personal history of Methicillin resistant Staphylococcus aureus infection: Secondary | ICD-10-CM | POA: Insufficient documentation

## 2015-06-18 DIAGNOSIS — M25619 Stiffness of unspecified shoulder, not elsewhere classified: Secondary | ICD-10-CM

## 2015-06-18 DIAGNOSIS — R29898 Other symptoms and signs involving the musculoskeletal system: Secondary | ICD-10-CM

## 2015-06-18 DIAGNOSIS — M542 Cervicalgia: Secondary | ICD-10-CM | POA: Diagnosis not present

## 2015-06-18 NOTE — Patient Instructions (Signed)
[email or web address] Scapular retractions 2x10
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
## 2015-06-20 ENCOUNTER — Telehealth: Payer: Self-pay | Admitting: Family Medicine

## 2015-06-20 NOTE — Telephone Encounter (Signed)
I have completed all required forms to my knowledge.  The patient has a medial malleolar fracture.  07/27/2015 return to work is probable, but you can never know with absolute certainty.

## 2015-06-20 NOTE — Telephone Encounter (Signed)
Called Claudia at [facility] and got her voicemail. Left my name and extension for her to call be back in regards to Ms. Hird.

## 2015-06-20 NOTE — Telephone Encounter (Signed)
Kassidi called stating her case manger @ [employer] (claudia) called and left a message with someone wasn't sure who with.  But Rosemarie Ax needs verbal confirmation of how long she will be out of work and that her ankle is broken and that she will be back to work on 07/26/18.  She needs someone to talk to Rosemarie Ax today so she can get paid next week.  Claudia phone # is  [phone] ext [phone]  Please call Dawnisha back when you have to talk to [facility]

## 2015-06-21 NOTE — Telephone Encounter (Signed)
Spoke with Rosemarie Ax at [facility].  Verbal confirmation given about her medial malleolar fracture and probable return to work date on 07/27/2015.

## 2015-06-21 NOTE — Telephone Encounter (Signed)
Left message for Rosemarie Ax at [facility] to return my call.

## 2015-06-24 ENCOUNTER — Encounter: Payer: Self-pay | Admitting: Family Medicine

## 2015-06-24 ENCOUNTER — Ambulatory Visit (INDEPENDENT_AMBULATORY_CARE_PROVIDER_SITE_OTHER): Payer: BLUE CROSS/BLUE SHIELD | Admitting: Family Medicine

## 2015-06-24 ENCOUNTER — Ambulatory Visit (INDEPENDENT_AMBULATORY_CARE_PROVIDER_SITE_OTHER)
Admission: RE | Admit: 2015-06-24 | Discharge: 2015-06-24 | Disposition: A | Discharge status: Home / Self Care | Payer: BLUE CROSS/BLUE SHIELD | Source: Ambulatory Visit | Attending: Family Medicine | Admitting: Family Medicine

## 2015-06-24 VITALS — BP 106/60 | HR 85 | Temp 97.7°F | Ht 64.25 in | Wt 158.8 lb

## 2015-06-24 DIAGNOSIS — S82891D Other fracture of right lower leg, subsequent encounter for closed fracture with routine healing: Secondary | ICD-10-CM | POA: Diagnosis not present

## 2015-06-24 DIAGNOSIS — S82891A Other fracture of right lower leg, initial encounter for closed fracture: Secondary | ICD-10-CM | POA: Diagnosis not present

## 2015-06-24 DIAGNOSIS — L259 Unspecified contact dermatitis, unspecified cause: Secondary | ICD-10-CM | POA: Diagnosis not present

## 2015-06-24 DIAGNOSIS — M25571 Pain in right ankle and joints of right foot: Secondary | ICD-10-CM

## 2015-06-24 MED ORDER — TRIAMCINOLONE ACETONIDE 0.1 % EX CREA: 1.0000 "application " | TOPICAL_CREAM | Freq: Two times a day (BID) | CUTANEOUS | Status: DC

## 2015-06-24 NOTE — Progress Notes (Signed)
Pre visit review using our clinic review tool, if applicable. No additional management support is needed unless otherwise documented below in the visit note. 
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
## 2015-06-25 ENCOUNTER — Ambulatory Visit: Payer: BLUE CROSS/BLUE SHIELD

## 2015-06-25 ENCOUNTER — Telehealth: Payer: Self-pay | Admitting: Family Medicine

## 2015-06-25 DIAGNOSIS — M25619 Stiffness of unspecified shoulder, not elsewhere classified: Secondary | ICD-10-CM

## 2015-06-25 DIAGNOSIS — R29898 Other symptoms and signs involving the musculoskeletal system: Secondary | ICD-10-CM

## 2015-06-25 DIAGNOSIS — M542 Cervicalgia: Secondary | ICD-10-CM

## 2015-06-25 NOTE — Telephone Encounter (Signed)
Patient returned Dr.Copland's call.  Patient has been in doctors appointments and she has bad cell service.  Patient said Dr.Copland can call her on her sister's cell phone. The number is [phone].

## 2015-06-25 NOTE — Telephone Encounter (Signed)
Spoke with patient tonight about consult. Ankle still stable, no change - somewhat more adjacent pain / sensitivity than typical. We are going to consult Dr. Doran Durand for his opinion.

## 2015-06-25 NOTE — Telephone Encounter (Addendum)
Spoke with [employer].  She is on her way to physical therapy.  Information below relayed to patient.  She wants to talk to you.  She ask that you call her after 11:30 am and she will answer her phone.

## 2015-06-25 NOTE — Telephone Encounter (Signed)
Danielle Strickland,   I tried to call the patient twice this morning, but got no answer. Not an emergency.  Following up on our conversation yesterday and thinking when she left, I made her an appointment with Dr. Wylene Simmer at [facility]. He is the only foot and ankle trained Orthopedist in the region, and he is the top expert in our area.   She has an appointment with him on Friday, 07/05/2015 at 12 noon at their office at the [place] area - and can f/u with him rather than me.  Can you ask her to answer her cell phone - an unknown or blocked number is likely from me.  Electronically Signed  By: Owens Loffler, MD On: 06/25/2015 10:05 AM
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
## 2015-06-25 NOTE — Telephone Encounter (Signed)
Noted. Attempted call again. No answer. LMOM.

## 2015-06-25 NOTE — Patient Instructions (Signed)
[email or web address] Shoulder rows with yellow band 2x10 Horizontal shoulder abduction with yellow band 2x10 Pectoralis Stretch in supine with towel roll

## 2015-06-27 ENCOUNTER — Ambulatory Visit (INDEPENDENT_AMBULATORY_CARE_PROVIDER_SITE_OTHER): Payer: BLUE CROSS/BLUE SHIELD | Admitting: Psychology

## 2015-06-27 DIAGNOSIS — F4323 Adjustment disorder with mixed anxiety and depressed mood: Secondary | ICD-10-CM

## 2015-07-04 ENCOUNTER — Ambulatory Visit: Payer: BLUE CROSS/BLUE SHIELD | Attending: Family Medicine

## 2015-07-04 DIAGNOSIS — M7580 Other shoulder lesions, unspecified shoulder: Secondary | ICD-10-CM | POA: Diagnosis present

## 2015-07-04 DIAGNOSIS — R29898 Other symptoms and signs involving the musculoskeletal system: Secondary | ICD-10-CM | POA: Diagnosis present

## 2015-07-04 DIAGNOSIS — M25619 Stiffness of unspecified shoulder, not elsewhere classified: Secondary | ICD-10-CM

## 2015-07-04 DIAGNOSIS — M542 Cervicalgia: Secondary | ICD-10-CM | POA: Insufficient documentation
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
## 2015-07-08 ENCOUNTER — Ambulatory Visit: Payer: BLUE CROSS/BLUE SHIELD | Admitting: Family Medicine

## 2015-07-11 ENCOUNTER — Telehealth: Payer: Self-pay | Admitting: Family Medicine

## 2015-07-11 ENCOUNTER — Ambulatory Visit (INDEPENDENT_AMBULATORY_CARE_PROVIDER_SITE_OTHER): Payer: BLUE CROSS/BLUE SHIELD | Admitting: Psychology

## 2015-07-11 DIAGNOSIS — F4323 Adjustment disorder with mixed anxiety and depressed mood: Secondary | ICD-10-CM

## 2015-07-11 NOTE — Telephone Encounter (Signed)
Pt dropped of loan form to be completed by Dr. Lorelei Pont. Form in Donna's in box. Pt asked if we could please call her when it is ready and not fax it over.

## 2015-07-11 NOTE — Telephone Encounter (Signed)
Form placed in Dr. Lillie Fragmin in box for completion.

## 2015-07-12 ENCOUNTER — Other Ambulatory Visit: Payer: Self-pay

## 2015-07-12 ENCOUNTER — Other Ambulatory Visit: Payer: Self-pay | Admitting: Family Medicine

## 2015-07-12 NOTE — Telephone Encounter (Signed)
This was completed and signed.

## 2015-07-12 NOTE — Telephone Encounter (Signed)
Left message for Danielle Strickland that her Danielle Strickland Form is completed and ready to be picked up at the front desk.

## 2015-07-12 NOTE — Telephone Encounter (Signed)
Rout to PCP 
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
## 2015-07-16 MED ORDER — DIAZEPAM 5 MG PO TABS: 5.0000 mg | ORAL_TABLET | Freq: Three times a day (TID) | ORAL | Status: DC | PRN

## 2015-07-16 NOTE — Telephone Encounter (Signed)
Rx faxed to pharmacy  

## 2015-07-22 ENCOUNTER — Telehealth: Payer: Self-pay | Admitting: *Deleted

## 2015-07-22 ENCOUNTER — Ambulatory Visit: Payer: BLUE CROSS/BLUE SHIELD

## 2015-07-22 DIAGNOSIS — R29898 Other symptoms and signs involving the musculoskeletal system: Secondary | ICD-10-CM

## 2015-07-22 DIAGNOSIS — M542 Cervicalgia: Secondary | ICD-10-CM

## 2015-07-22 NOTE — Telephone Encounter (Signed)
Left message for Danielle Strickland with [facility] that she would need to contact [facility], Dr. Wylene Simmer at [phone] for care management of [name] from this point forward.

## 2015-07-22 NOTE — Telephone Encounter (Signed)
Danielle Strickland left a message that she needs to confirm treatment plan for patient. Danielle Strickland wanted to confirm that patient is wearing a hard cast and plans on returning on 08/19/15 and may be scheduling surgery at that time. Danielle Strickland stated that she needs to up date patient's short term disability and request a call back to confirm.

## 2015-07-22 NOTE — Telephone Encounter (Signed)
Patient initially saw Dr. Wylene Simmer, [facility] on 07/05/2015, and he is directing care management from that point forward. I defer to his judgement. They should contact [employer].
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
## 2015-07-25 ENCOUNTER — Other Ambulatory Visit: Payer: Self-pay

## 2015-07-25 DIAGNOSIS — Z1231 Encounter for screening mammogram for malignant neoplasm of breast: Secondary | ICD-10-CM

## 2015-07-29 ENCOUNTER — Encounter: Payer: Self-pay | Admitting: Primary Care

## 2015-07-29 ENCOUNTER — Ambulatory Visit (INDEPENDENT_AMBULATORY_CARE_PROVIDER_SITE_OTHER): Payer: BLUE CROSS/BLUE SHIELD | Admitting: Primary Care

## 2015-07-29 VITALS — BP 126/72 | HR 100 | Temp 98.0°F | Ht 64.0 in | Wt 163.8 lb

## 2015-07-29 DIAGNOSIS — R21 Rash and other nonspecific skin eruption: Secondary | ICD-10-CM | POA: Diagnosis not present

## 2015-07-29 MED ORDER — PREDNISONE 20 MG PO TABS: ORAL_TABLET | ORAL | Status: DC

## 2015-07-29 MED ORDER — VALACYCLOVIR HCL 1 G PO TABS: 1000.0000 mg | ORAL_TABLET | Freq: Three times a day (TID) | ORAL | Status: DC

## 2015-07-29 NOTE — Patient Instructions (Signed)
Start Valacyclovir tablets for possible shingles. Take 1 tablet by mouth three times daily for 7 days.  Start prednisone for rash and irritative symptoms. Take 2 tablets daily for 3 days, then 1 tablet daily for 3 days.  Follow up with dermatologist as scheduled.  It was a pleasure meeting you!   Shingles Shingles (herpes zoster) is an infection that is caused by the same virus that causes chickenpox (varicella). The infection causes a painful skin rash and fluid-filled blisters, which eventually break open, crust over, and heal. It may occur in any area of the body, but it usually affects only one side of the body or face. The pain of shingles usually lasts about 1 month. However, some people with shingles may develop long-term (chronic) pain in the affected area of the body. Shingles often occurs many years after the person had chickenpox. It is more common:  In people older than 50 years.  In people with weakened immune systems, such as those with HIV, AIDS, or cancer.  In people taking medicines that weaken the immune system, such as transplant medicines.  In people under great stress. CAUSES  Shingles is caused by the varicella zoster virus (VZV), which also causes chickenpox. After a person is infected with the virus, it can remain in the person's body for years in an inactive state (dormant). To cause shingles, the virus reactivates and breaks out as an infection in a nerve root. The virus can be spread from person to person (contagious) through contact with open blisters of the shingles rash. It will only spread to people who have not had chickenpox. When these people are exposed to the virus, they may develop chickenpox. They will not develop shingles. Once the blisters scab over, the person is no longer contagious and cannot spread the virus to others. SIGNS AND SYMPTOMS  Shingles shows up in stages. The initial symptoms may be pain, itching, and tingling in an area of the skin.  This pain is usually described as burning, stabbing, or throbbing.In a few days or weeks, a painful red rash will appear in the area where the pain, itching, and tingling were felt. The rash is usually on one side of the body in a band or belt-like pattern. Then, the rash usually turns into fluid-filled blisters. They will scab over and dry up in approximately 2-3 weeks. Flu-like symptoms may also occur with the initial symptoms, the rash, or the blisters. These may include:  Fever.  Chills.  Headache.  Upset stomach. DIAGNOSIS  Your health care provider will perform a skin exam to diagnose shingles. Skin scrapings or fluid samples may also be taken from the blisters. This sample will be examined under a microscope or sent to a lab for further testing. TREATMENT  There is no specific cure for shingles. Your health care provider will likely prescribe medicines to help you manage the pain, recover faster, and avoid long-term problems. This may include antiviral drugs, anti-inflammatory drugs, and pain medicines. HOME CARE INSTRUCTIONS   Take a cool bath or apply cool compresses to the area of the rash or blisters as directed. This may help with the pain and itching.   Take medicines only as directed by your health care provider.   Rest as directed by your health care provider.  Keep your rash and blisters clean with mild soap and cool water or as directed by your health care provider.  Do not pick your blisters or scratch your rash. Apply an anti-itch cream or  numbing creams to the affected area as directed by your health care provider.  Keep your shingles rash covered with a loose bandage (dressing).  Avoid skin contact with:  Babies.   Pregnant women.   Children with eczema.   Elderly people with transplants.   People with chronic illnesses, such as leukemia or AIDS.   Wear loose-fitting clothing to help ease the pain of material rubbing against the rash.  Keep all  follow-up visits as directed by your health care provider.If the area involved is on your face, you may receive a referral for a specialist, such as an eye doctor (ophthalmologist) or an ear, nose, and throat (ENT) doctor. Keeping all follow-up visits will help you avoid eye problems, chronic pain, or disability.  SEEK IMMEDIATE MEDICAL CARE IF:   You have facial pain, pain around the eye area, or loss of feeling on one side of your face.  You have ear pain or ringing in your ear.  You have loss of taste.  Your pain is not relieved with prescribed medicines.   Your redness or swelling spreads.   You have more pain and swelling.  Your condition is worsening or has changed.   You have a fever. MAKE SURE YOU:  Understand these instructions.  Will watch your condition.  Will get help right away if you are not doing well or get worse. Document Released: 10/12/2005 Document Revised: 02/26/2014 Document Reviewed: 05/26/2012 [facility] Patient Information 2015 [place], [place]. This information is not intended to replace advice given to you by your health care provider. Make sure you discuss any questions you have with your health care provider.

## 2015-07-29 NOTE — Progress Notes (Signed)
Pre visit review using our clinic review tool, if applicable. No additional management support is needed unless otherwise documented below in the visit note. 

## 2015-07-29 NOTE — Progress Notes (Signed)
Subjective:    Patient ID: Danielle Strickland, female    DOB: 1965/12/07, 49 y.o.   MRN: [id number]  HPI  Danielle Strickland is a 49 year old female who presents today with a chief complaint of rash. Her rash is located to the upper posterior chest, base of neck, right lateral chest, and right lateral breast. She reports irritative itching and had a friend who's an RN take a look and notice a "vessicle" this past weekend. She has been seeing a dermatologist for on going rashes for the past 20 years. She is due for a patch test later this month along with biopsy. Denies pain. She's tried ice packs with some relief, triamcinolone, lidex, desonide, Bactroban, OTC psoriasis cream all without improvement in symptoms. Her itching is the most bothersome complaint.  Review of Systems  Constitutional: Negative for fever and chills.  HENT: Negative for rhinorrhea, sneezing and sore throat.   Respiratory: Negative for shortness of breath.   Cardiovascular: Negative for chest pain.  Skin: Positive for rash.       Past Medical History  Diagnosis Date  . HPV in female   . Abnormal Pap smear of vagina and vaginal HPV     ascus   . Migraines   . Microscopic hematuria   . Mental disorder     anxiety  . Endometriosis   . 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 GERD (gastroesophageal reflux disease)   . IBS (irritable bowel syndrome)   . Liver disease     H/O hepatitis  . Allergy   . Skin cancer     left thigh  . Peptic ulcer disease   . History of migraine     Social History   Social History  . Marital Status: Legally Separated    Spouse Name: N/A  . Number of Children: N/A  . Years of Education: N/A   Occupational History  . Not on file.   Social History Main Topics  . Smoking status: Former Smoker -- 1.00 packs/day    Types: Cigarettes  . Smokeless tobacco: Never Used     Comment: Quit May 8  . Alcohol Use: No  . Drug Use: No  . Sexual Activity: Yes     Comment:  hyst/BTL    Other Topics Concern  . Not on file   Social History Narrative   [phone]   Married to fourth husband    Past Surgical History  Procedure Laterality Date  . Laparoscopically assisted vaginal hysterectomy  2006  . Tubal ligation    . Wisdom tooth extraction    . Salivary gland surgery      Family History  Problem Relation Age of Onset  . Cancer Mother     colon   . Thyroid cancer Maternal Aunt   . Breast cancer Maternal Grandmother   . Breast cancer Maternal Aunt     Allergies  Allergen Reactions  . Augmentin [Amoxicillin-Pot Clavulanate]   . Codeine   . Latex     Current Outpatient Prescriptions on File Prior to Visit  Medication Sig Dispense Refill  . diazepam (VALIUM) 5 MG tablet Take 1 tablet (5 mg total) by mouth every 8 (eight) hours as needed for muscle spasms. 30 tablet 0  . ibuprofen (ADVIL,MOTRIN) 800 MG tablet TAKE ONE TABLET BY MOUTH EVERY 8 HOURS AS NEEDED 30 tablet 0  . LINZESS 145 MCG CAPS capsule Take 1 capsule by mouth daily as needed.    [name]  mupirocin ointment (BACTROBAN) 2 % Place 1 application into the left ear 3 (three) times daily.     [name] NAPROXEN PO Take 550 mg by mouth as needed.     . pantoprazole (PROTONIX) 40 MG tablet Take 1 tablet by mouth daily.    . promethazine (PHENERGAN) 25 MG tablet Take 1 tablet (25 mg total) by mouth every 6 (six) hours as needed. 30 tablet 2  . SUMAtriptan (IMITREX) 50 MG tablet Take 50 mg by mouth every 2 (two) hours as needed.    . triamcinolone cream (KENALOG) 0.1 % Apply 1 application topically 2 (two) times daily. 454 g 0  . methocarbamol (ROBAXIN) 500 MG tablet Take 1 tablet (500 mg total) by mouth 3 (three) times daily as needed for muscle spasms. (Patient not taking: Reported on 07/29/2015) 30 tablet 1   No current facility-administered medications on file prior to visit.    BP 126/72 mmHg  Pulse 100  Temp(Src) 98 F (36.7 C) (Oral)  Ht 5\' 4"  (1.626 m)  Wt 163 lb 12.8 oz (74.299 kg)  BMI 28.10  kg/m2  SpO2 98%    Objective:   Physical Exam  Constitutional: She appears well-nourished.  Cardiovascular: Normal rate and regular rhythm.   Pulmonary/Chest: Effort normal and breath sounds normal.  Skin: Skin is warm and dry.     Rash to base of neck, upper back, right lateral chest wall and right lateral breast. Seems slightly representative of shingles as the rashes are separate by follow dermatone line. Tender and very irritative upon palpation. Possible healing vesicle to upper back.           Assessment & Plan:  Rash:  Present to neck, upper back, right lateral chest, right lateral breast. Currently following with dermatology and is due for patch test with biopsy later this week. Rash is very odd in nature with some characteristics of shingles and psoriasis. Could be stress reaction as she is going through a divorce. No relief with RX steroid creams. Will treat for possible shingles with Valtrex 1000 mg TID x 7 days and also with prednisone taper. Hold current valtrex 500 mg RX. Cool compresses, discussed home care instructions for possible shingles. Follow up with Dermatology as scheduled.

## 2015-08-01 ENCOUNTER — Ambulatory Visit (INDEPENDENT_AMBULATORY_CARE_PROVIDER_SITE_OTHER): Payer: BLUE CROSS/BLUE SHIELD | Admitting: Psychology

## 2015-08-01 DIAGNOSIS — F4323 Adjustment disorder with mixed anxiety and depressed mood: Secondary | ICD-10-CM | POA: Diagnosis not present

## 2015-08-07 ENCOUNTER — Ambulatory Visit: Payer: Self-pay

## 2015-08-14 ENCOUNTER — Other Ambulatory Visit: Payer: Self-pay | Admitting: Internal Medicine

## 2015-08-14 NOTE — Telephone Encounter (Signed)
Last filled 07/16/15--please advise

## 2015-08-15 NOTE — Telephone Encounter (Signed)
Rx called in to pharmacy. 

## 2015-08-22 ENCOUNTER — Ambulatory Visit (INDEPENDENT_AMBULATORY_CARE_PROVIDER_SITE_OTHER): Payer: BLUE CROSS/BLUE SHIELD | Admitting: Psychology

## 2015-08-22 DIAGNOSIS — F4323 Adjustment disorder with mixed anxiety and depressed mood: Secondary | ICD-10-CM | POA: Diagnosis not present

## 2015-08-30 ENCOUNTER — Ambulatory Visit
Admission: RE | Admit: 2015-08-30 | Discharge: 2015-08-30 | Disposition: A | Discharge status: Home / Self Care | Payer: BLUE CROSS/BLUE SHIELD | Source: Ambulatory Visit

## 2015-08-30 DIAGNOSIS — Z1231 Encounter for screening mammogram for malignant neoplasm of breast: Secondary | ICD-10-CM

## 2015-09-03 ENCOUNTER — Other Ambulatory Visit: Payer: Self-pay | Admitting: Obstetrics and Gynecology

## 2015-09-03 DIAGNOSIS — R928 Other abnormal and inconclusive findings on diagnostic imaging of breast: Secondary | ICD-10-CM

## 2015-09-04 ENCOUNTER — Other Ambulatory Visit (HOSPITAL_COMMUNITY): Payer: Self-pay | Admitting: *Deleted

## 2015-09-04 DIAGNOSIS — R928 Other abnormal and inconclusive findings on diagnostic imaging of breast: Secondary | ICD-10-CM

## 2015-09-10 ENCOUNTER — Other Ambulatory Visit: Payer: Self-pay

## 2015-09-12 ENCOUNTER — Ambulatory Visit (INDEPENDENT_AMBULATORY_CARE_PROVIDER_SITE_OTHER): Payer: Self-pay | Admitting: Psychology

## 2015-09-12 DIAGNOSIS — F4323 Adjustment disorder with mixed anxiety and depressed mood: Secondary | ICD-10-CM

## 2015-09-13 ENCOUNTER — Ambulatory Visit (HOSPITAL_COMMUNITY)
Admission: RE | Admit: 2015-09-13 | Discharge: 2015-09-13 | Disposition: A | Discharge status: Home / Self Care | Payer: BLUE CROSS/BLUE SHIELD | Source: Ambulatory Visit | Attending: Obstetrics and Gynecology | Admitting: Obstetrics and Gynecology

## 2015-09-13 ENCOUNTER — Ambulatory Visit
Admission: RE | Admit: 2015-09-13 | Discharge: 2015-09-13 | Disposition: A | Discharge status: Home / Self Care | Payer: No Typology Code available for payment source | Source: Ambulatory Visit | Attending: Obstetrics and Gynecology | Admitting: Obstetrics and Gynecology

## 2015-09-13 ENCOUNTER — Other Ambulatory Visit (HOSPITAL_COMMUNITY): Payer: Self-pay | Admitting: Obstetrics and Gynecology

## 2015-09-13 ENCOUNTER — Encounter (HOSPITAL_COMMUNITY): Payer: Self-pay

## 2015-09-13 VITALS — BP 118/68 | Temp 98.0°F | Ht 64.0 in

## 2015-09-13 DIAGNOSIS — N6311 Unspecified lump in the right breast, upper outer quadrant: Secondary | ICD-10-CM

## 2015-09-13 DIAGNOSIS — R928 Other abnormal and inconclusive findings on diagnostic imaging of breast: Secondary | ICD-10-CM

## 2015-09-13 DIAGNOSIS — Z1239 Encounter for other screening for malignant neoplasm of breast: Secondary | ICD-10-CM

## 2015-09-13 NOTE — Patient Instructions (Signed)
Educational materials on self breast awareness given. Explained to [employer] that she did not need a Pap smear today due to her history of a hysterectomy for benign reasons. Let patient know that she does not need any further Pap smears due to her history of a hysterectomy for benign reasons. Referred patient to the [facility] for right breast diagnostic mammogram and possible ultrasound. Appointment scheduled for Friday, September 13, 2015 at 1330. Patient aware of appointment and will be there. Danielle Strickland verbalized understanding.  [place], Arvil Chaco, RN 2:28 PM

## 2015-09-13 NOTE — Progress Notes (Signed)
Patient referred to [facility] by the [facility] due to recommending additional imaging of the right breast. Screening mammogram completed 08/30/2015 at the [facility].  Pap Smear:  Pap smear not completed today. Last Pap smear was in October 2015 at [facility] and normal per patient. Per patient has no history of an abnormal Pap smear. Patient has a history of a hysterectomy 04/20/2005 due to DUB and endometriosis. Patient no longer needs Pap smears due to her history of a hysterectomy for benign reasons per BCCCP and ACOG guidelines. Last Pap smear result isn't in EPIC. Previous Pap smear result from 09/07/2012 is in EPIC.  Physical exam: Breasts Breasts symmetrical. No skin abnormalities left breast. Right outer breast has a reddened rash. Per patient has dermatitis on her right breast. No nipple retraction bilateral breasts. No nipple discharge bilateral breasts. No lymphadenopathy. No lumps palpated left breast. Palpated a lump within the right breast at 10 o'clock 3 cm from the nipple. No complaints of pain or tenderness on exam. Referred patient to the [facility] for right breast diagnostic mammogram and possible ultrasound. Appointment scheduled for Friday, September 13, 2015 at 1330.  Pelvic/Bimanual No Pap smear completed today since patient has a history of a hysterectomy for benign reasons. Pap smear not indicated per BCCCP guidelines.

## 2015-09-17 ENCOUNTER — Other Ambulatory Visit: Payer: Self-pay

## 2015-09-17 DIAGNOSIS — K219 Gastro-esophageal reflux disease without esophagitis: Secondary | ICD-10-CM

## 2015-09-17 MED ORDER — PANTOPRAZOLE SODIUM 40 MG PO TBEC: 40.0000 mg | DELAYED_RELEASE_TABLET | Freq: Every day | ORAL | Status: DC

## 2015-09-18 ENCOUNTER — Other Ambulatory Visit: Payer: Self-pay

## 2015-09-18 MED ORDER — DIAZEPAM 5 MG PO TABS: ORAL_TABLET | ORAL | Status: DC

## 2015-09-18 NOTE — Telephone Encounter (Signed)
Pt left v/m requesting refill diazepam to [facility] [address]. Pt requested refill from [facility] on 09/16/15.pt request cb today. Last printed # 30 on 08/14/15 and pt last f/u 05/29/15.

## 2015-09-18 NOTE — Telephone Encounter (Signed)
Rx called in to requested pharmacy 

## 2015-10-02 ENCOUNTER — Other Ambulatory Visit: Payer: Self-pay

## 2015-10-02 DIAGNOSIS — K5909 Other constipation: Secondary | ICD-10-CM

## 2015-10-02 MED ORDER — LINACLOTIDE 145 MCG PO CAPS
145.0000 ug | ORAL_CAPSULE | Freq: Every day | ORAL | Status: DC | PRN
Start: 2015-10-02 — End: 2016-12-08

## 2015-10-17 ENCOUNTER — Ambulatory Visit (INDEPENDENT_AMBULATORY_CARE_PROVIDER_SITE_OTHER): Payer: BLUE CROSS/BLUE SHIELD | Admitting: Psychology

## 2015-10-17 ENCOUNTER — Other Ambulatory Visit: Payer: Self-pay | Admitting: *Deleted

## 2015-10-17 DIAGNOSIS — F4323 Adjustment disorder with mixed anxiety and depressed mood: Secondary | ICD-10-CM

## 2015-10-17 MED ORDER — DIAZEPAM 5 MG PO TABS: ORAL_TABLET | ORAL | Status: DC

## 2015-10-17 NOTE — Telephone Encounter (Signed)
Rx called in to requested pharmacy 
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
## 2015-10-17 NOTE — Telephone Encounter (Signed)
Ok to refill one time only.  Needs 30 minute follow up for further refills.

## 2015-11-13 ENCOUNTER — Ambulatory Visit: Payer: BLUE CROSS/BLUE SHIELD | Attending: Orthopedic Surgery | Admitting: Physical Therapy

## 2015-11-13 DIAGNOSIS — M25571 Pain in right ankle and joints of right foot: Secondary | ICD-10-CM | POA: Insufficient documentation

## 2015-11-13 DIAGNOSIS — R531 Weakness: Secondary | ICD-10-CM | POA: Insufficient documentation

## 2015-11-13 DIAGNOSIS — R262 Difficulty in walking, not elsewhere classified: Secondary | ICD-10-CM | POA: Insufficient documentation
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
## 2015-11-15 ENCOUNTER — Encounter: Payer: Self-pay | Admitting: Physical Therapy

## 2015-11-15 ENCOUNTER — Ambulatory Visit: Payer: Self-pay | Admitting: Physical Therapy

## 2015-11-15 DIAGNOSIS — M25571 Pain in right ankle and joints of right foot: Secondary | ICD-10-CM

## 2015-11-15 DIAGNOSIS — R531 Weakness: Secondary | ICD-10-CM

## 2015-11-15 DIAGNOSIS — R262 Difficulty in walking, not elsewhere classified: Secondary | ICD-10-CM
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
## 2015-11-18 ENCOUNTER — Other Ambulatory Visit: Payer: Self-pay | Admitting: *Deleted

## 2015-11-18 NOTE — Telephone Encounter (Signed)
Refill one time only.  Needs 30 min OV for further refills.

## 2015-11-18 NOTE — Telephone Encounter (Signed)
This pt has not been seen for f/u visit since 01/2013

## 2015-11-19 MED ORDER — DIAZEPAM 5 MG PO TABS: ORAL_TABLET | ORAL | Status: DC

## 2015-11-19 NOTE — Telephone Encounter (Signed)
Rx called in to requested pharmacy. Spoke to pt and scheduled f/u appt

## 2015-11-20 ENCOUNTER — Encounter: Payer: Self-pay | Admitting: Physical Therapy

## 2015-11-20 ENCOUNTER — Ambulatory Visit: Payer: Self-pay | Admitting: Physical Therapy

## 2015-11-20 DIAGNOSIS — R262 Difficulty in walking, not elsewhere classified: Secondary | ICD-10-CM

## 2015-11-20 DIAGNOSIS — M25571 Pain in right ankle and joints of right foot: Secondary | ICD-10-CM

## 2015-11-20 DIAGNOSIS — R531 Weakness: Secondary | ICD-10-CM
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
## 2015-11-22 ENCOUNTER — Ambulatory Visit: Payer: Self-pay | Admitting: Physical Therapy

## 2015-11-22 ENCOUNTER — Encounter: Payer: Self-pay | Admitting: Physical Therapy

## 2015-11-22 DIAGNOSIS — R531 Weakness: Secondary | ICD-10-CM

## 2015-11-22 DIAGNOSIS — R262 Difficulty in walking, not elsewhere classified: Secondary | ICD-10-CM

## 2015-11-22 DIAGNOSIS — M25571 Pain in right ankle and joints of right foot: Secondary | ICD-10-CM
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
## 2015-11-22 NOTE — Patient Instructions (Signed)
  ANKLE: Dorsiflexion (Band)    Sit at edge of surface. Place band around top of foot. Keeping heel on floor, raise toes of banded foot. Hold __2_ seconds. Use _____yellow___ band. _5__ reps per set, _2__ sets per day, _5__ days per week  Copyright  VHI. All rights reserved.  ANKLE: Eversion, Unilateral (Band)    Place band around left foot. Keeping heel in place, raise toes of banded foot up and away from body. Do not move hip. Hold _2__ seconds. Use __yellow______ band. _5__ reps per set, __2_ sets per day, _5__ days per week  Copyright  VHI. All rights reserved.  ANKLE: Plantarflexion - Sitting (Band)    Place band around foot; hold other end. Sit at edge of sitting surface. Keep heel in place. Push foot down against band. Hold _2__ seconds. Use _yellow_____ band. _5__ reps per set, __2_ sets per day, _5__ days per week  Copyright  VHI. All rights reserved.

## 2015-11-25 ENCOUNTER — Encounter: Payer: Self-pay | Admitting: Emergency Medicine

## 2015-11-25 ENCOUNTER — Emergency Department: Payer: Self-pay

## 2015-11-25 ENCOUNTER — Observation Stay
Admission: EM | Admit: 2015-11-25 | Discharge: 2015-11-27 | Disposition: A | Discharge status: Home / Self Care | Payer: Self-pay | Attending: Surgery | Admitting: Surgery

## 2015-11-25 ENCOUNTER — Telehealth: Payer: Self-pay | Admitting: Surgery

## 2015-11-25 DIAGNOSIS — K819 Cholecystitis, unspecified: Secondary | ICD-10-CM

## 2015-11-25 DIAGNOSIS — K5900 Constipation, unspecified: Secondary | ICD-10-CM | POA: Insufficient documentation

## 2015-11-25 DIAGNOSIS — R1013 Epigastric pain: Secondary | ICD-10-CM

## 2015-11-25 DIAGNOSIS — K589 Irritable bowel syndrome without diarrhea: Secondary | ICD-10-CM | POA: Insufficient documentation

## 2015-11-25 DIAGNOSIS — K8010 Calculus of gallbladder with chronic cholecystitis without obstruction: Secondary | ICD-10-CM | POA: Diagnosis present

## 2015-11-25 DIAGNOSIS — Z85828 Personal history of other malignant neoplasm of skin: Secondary | ICD-10-CM | POA: Insufficient documentation

## 2015-11-25 DIAGNOSIS — K219 Gastro-esophageal reflux disease without esophagitis: Secondary | ICD-10-CM | POA: Insufficient documentation

## 2015-11-25 DIAGNOSIS — F419 Anxiety disorder, unspecified: Secondary | ICD-10-CM | POA: Insufficient documentation

## 2015-11-25 DIAGNOSIS — B009 Herpesviral infection, unspecified: Secondary | ICD-10-CM | POA: Insufficient documentation

## 2015-11-25 DIAGNOSIS — K8000 Calculus of gallbladder with acute cholecystitis without obstruction: Principal | ICD-10-CM | POA: Insufficient documentation

## 2015-11-25 DIAGNOSIS — Z87891 Personal history of nicotine dependence: Secondary | ICD-10-CM | POA: Insufficient documentation

## 2015-11-25 LAB — COMPREHENSIVE METABOLIC PANEL
ALT: 19 U/L (ref 14–54)
AST: 21 U/L (ref 15–41)
Albumin: 4.7 g/dL (ref 3.5–5.0)
Alkaline Phosphatase: 90 U/L (ref 38–126)
Anion gap: 9 (ref 5–15)
BUN: 10 mg/dL (ref 6–20)
CHLORIDE: 105 mmol/L (ref 101–111)
CO2: 24 mmol/L (ref 22–32)
CREATININE: 0.66 mg/dL (ref 0.44–1.00)
Calcium: 9.2 mg/dL (ref 8.9–10.3)
GFR calc Af Amer: >60 mL/min (ref >60)
GFR calc non Af Amer: >60 mL/min (ref >60)
Glucose, Bld: 135 mg/dL — ABNORMAL HIGH (ref 65–99)
POTASSIUM: 3.6 mmol/L (ref 3.5–5.1)
SODIUM: 138 mmol/L (ref 135–145)
Total Bilirubin: 0.5 mg/dL (ref 0.3–1.2)
Total Protein: 8.1 g/dL (ref 6.5–8.1)

## 2015-11-25 LAB — CBC
HEMATOCRIT: 38.4 % (ref 35.0–47.0)
Hemoglobin: 13.4 g/dL (ref 12.0–16.0)
MCH: 32 pg (ref 26.0–34.0)
MCHC: 34.8 g/dL (ref 32.0–36.0)
MCV: 91.8 fL (ref 80.0–100.0)
PLATELETS: 272 10*3/uL (ref 150–440)
RBC: 4.18 MIL/uL (ref 3.80–5.20)
RDW: 12.8 % (ref 11.5–14.5)
WBC: 12.8 10*3/uL — ABNORMAL HIGH (ref 3.6–11.0)

## 2015-11-25 LAB — TROPONIN I: Troponin I: <0.03 ng/mL (ref <0.031)

## 2015-11-25 LAB — LIPASE, BLOOD: LIPASE: 22 U/L (ref 11–51)

## 2015-11-25 MED ORDER — MORPHINE SULFATE (PF) 4 MG/ML IV SOLN
4.0000 mg | Freq: Once | INTRAVENOUS | Status: AC
  Administered 2015-11-25: 4 mg via INTRAVENOUS

## 2015-11-25 MED ORDER — ONDANSETRON HCL 4 MG/2ML IJ SOLN
INTRAMUSCULAR | Status: AC
  Administered 2015-11-26: 4 mg via INTRAVENOUS
  Filled 2015-11-25: qty 2

## 2015-11-25 MED ORDER — MORPHINE SULFATE (PF) 4 MG/ML IV SOLN
INTRAVENOUS | Status: AC
  Administered 2015-11-25: 4 mg via INTRAVENOUS
  Filled 2015-11-25: qty 1

## 2015-11-25 MED ORDER — PANTOPRAZOLE SODIUM 40 MG IV SOLR
40.0000 mg | Freq: Every day | INTRAVENOUS | Status: DC
  Administered 2015-11-25 – 2015-11-26 (×2): 40 mg via INTRAVENOUS
  Filled 2015-11-25 (×2): qty 40

## 2015-11-25 MED ORDER — ONDANSETRON HCL 4 MG/2ML IJ SOLN
4.0000 mg | Freq: Once | INTRAMUSCULAR | Status: AC | PRN
  Administered 2015-11-25: 4 mg via INTRAVENOUS
  Filled 2015-11-25: qty 2

## 2015-11-25 MED ORDER — ACETAMINOPHEN 650 MG RE SUPP: 650.0000 mg | Freq: Four times a day (QID) | RECTAL | Status: DC | PRN

## 2015-11-25 MED ORDER — HYDROMORPHONE HCL 1 MG/ML IJ SOLN
1.0000 mg | INTRAMUSCULAR | Status: DC | PRN
  Administered 2015-11-25: 1 mg via INTRAVENOUS

## 2015-11-25 MED ORDER — MORPHINE SULFATE (PF) 4 MG/ML IV SOLN
4.0000 mg | Freq: Once | INTRAVENOUS | Status: AC
Start: 2015-11-25 — End: 2015-11-25
  Administered 2015-11-25: 4 mg via INTRAVENOUS

## 2015-11-25 MED ORDER — ACETAMINOPHEN 325 MG PO TABS: 650.0000 mg | ORAL_TABLET | Freq: Four times a day (QID) | ORAL | Status: DC | PRN

## 2015-11-25 MED ORDER — KCL IN DEXTROSE-NACL 20-5-0.45 MEQ/L-%-% IV SOLN
INTRAVENOUS | Status: DC
  Administered 2015-11-25 – 2015-11-27 (×3): via INTRAVENOUS
  Filled 2015-11-25 (×5): qty 1000

## 2015-11-25 MED ORDER — MORPHINE SULFATE (PF) 4 MG/ML IV SOLN
INTRAVENOUS | Status: AC
  Filled 2015-11-25: qty 1

## 2015-11-25 MED ORDER — HYDROMORPHONE HCL 1 MG/ML IJ SOLN
INTRAMUSCULAR | Status: AC
  Administered 2015-11-25: 1 mg via INTRAVENOUS
  Filled 2015-11-25: qty 1

## 2015-11-25 MED ORDER — ONDANSETRON 4 MG PO TBDP
4.0000 mg | ORAL_TABLET | Freq: Four times a day (QID) | ORAL | Status: DC | PRN
  Filled 2015-11-25: qty 1

## 2015-11-25 MED ORDER — ENOXAPARIN SODIUM 40 MG/0.4ML ~~LOC~~ SOLN
40.0000 mg | SUBCUTANEOUS | Status: DC
Start: 2015-11-25 — End: 2015-11-28
  Administered 2015-11-25 – 2015-11-27 (×3): 40 mg via SUBCUTANEOUS
  Filled 2015-11-25 (×3): qty 0.4

## 2015-11-25 MED ORDER — PIPERACILLIN-TAZOBACTAM 3.375 G IVPB
3.3750 g | Freq: Three times a day (TID) | INTRAVENOUS | Status: DC
  Administered 2015-11-25 – 2015-11-26 (×3): 3.375 g via INTRAVENOUS
  Filled 2015-11-25 (×4): qty 50

## 2015-11-25 MED ORDER — DIAZEPAM 5 MG PO TABS
5.0000 mg | ORAL_TABLET | Freq: Three times a day (TID) | ORAL | Status: DC | PRN
  Administered 2015-11-26 – 2015-11-27 (×2): 5 mg via ORAL
  Filled 2015-11-25 (×2): qty 1

## 2015-11-25 MED ORDER — OXYCODONE HCL 5 MG PO TABS
5.0000 mg | ORAL_TABLET | ORAL | Status: DC | PRN
  Administered 2015-11-26 – 2015-11-27 (×7): 10 mg via ORAL
  Filled 2015-11-25 (×6): qty 2

## 2015-11-25 MED ORDER — ONDANSETRON HCL 4 MG/2ML IJ SOLN
4.0000 mg | Freq: Four times a day (QID) | INTRAMUSCULAR | Status: DC | PRN
  Administered 2015-11-26: 4 mg via INTRAVENOUS
  Filled 2015-11-25 (×2): qty 2

## 2015-11-25 MED ORDER — PIPERACILLIN-TAZOBACTAM 3.375 G IVPB
INTRAVENOUS | Status: AC
  Administered 2015-11-25: 3.375 g via INTRAVENOUS
  Filled 2015-11-25: qty 50

## 2015-11-25 MED ORDER — ONDANSETRON HCL 4 MG/2ML IJ SOLN
4.0000 mg | Freq: Once | INTRAMUSCULAR | Status: AC
  Administered 2015-11-25: 4 mg via INTRAVENOUS
  Filled 2015-11-25: qty 2

## 2015-11-25 MED ORDER — MORPHINE SULFATE (PF) 4 MG/ML IV SOLN
4.0000 mg | Freq: Once | INTRAVENOUS | Status: AC
  Administered 2015-11-25: 4 mg via INTRAVENOUS
  Filled 2015-11-25: qty 1
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
## 2015-11-25 NOTE — Telephone Encounter (Signed)
Patient has called and stated that she feels as if she is having a gallbladder attack. She stated that she can not breath, having abdominal pain, nausea. She said she could not stand up and that she was having chest pains. I advised her to call 911 if she was having shortness of breath and chest pains. She said that her sister was on the way to take her to the Emergency Department at [facility]. I have advised her that i will inform Dr Azalee Course (day call) of her symptoms and that she will be arriving at the ED.

## 2015-11-25 NOTE — Telephone Encounter (Signed)
Noted! Thank you

## 2015-11-25 NOTE — ED Notes (Signed)
Epigastric pain began 1 hour ago. States history of gall bladder problems 2 years ago and feels like same pain.

## 2015-11-25 NOTE — ED Notes (Signed)
RN attempted to call report, no RN's available, will call this RN back momentarily.

## 2015-11-26 ENCOUNTER — Encounter: Payer: Self-pay | Admitting: Anesthesiology

## 2015-11-26 ENCOUNTER — Telehealth: Payer: Self-pay | Admitting: Family Medicine

## 2015-11-26 ENCOUNTER — Observation Stay: Payer: Self-pay | Admitting: Certified Registered Nurse Anesthetist

## 2015-11-26 ENCOUNTER — Encounter
Admission: EM | Disposition: A | Discharge status: Home / Self Care | Payer: Self-pay | Source: Home / Self Care | Attending: Emergency Medicine

## 2015-11-26 HISTORY — PX: CHOLECYSTECTOMY: SHX55

## 2015-11-26 LAB — COMPREHENSIVE METABOLIC PANEL
ALK PHOS: 88 U/L (ref 38–126)
ALT: 26 U/L (ref 14–54)
AST: 37 U/L (ref 15–41)
Albumin: 4.1 g/dL (ref 3.5–5.0)
Anion gap: 13 (ref 5–15)
BILIRUBIN TOTAL: 0.6 mg/dL (ref 0.3–1.2)
BUN: 8 mg/dL (ref 6–20)
CALCIUM: 8.9 mg/dL (ref 8.9–10.3)
CO2: 21 mmol/L — ABNORMAL LOW (ref 22–32)
CREATININE: 0.59 mg/dL (ref 0.44–1.00)
Chloride: 104 mmol/L (ref 101–111)
GFR calc Af Amer: >60 mL/min (ref >60)
Glucose, Bld: 106 mg/dL — ABNORMAL HIGH (ref 65–99)
POTASSIUM: 3.2 mmol/L — AB (ref 3.5–5.1)
Sodium: 138 mmol/L (ref 135–145)
TOTAL PROTEIN: 7.3 g/dL (ref 6.5–8.1)

## 2015-11-26 LAB — CBC
HEMATOCRIT: 37.7 % (ref 35.0–47.0)
Hemoglobin: 13.1 g/dL (ref 12.0–16.0)
MCH: 32.1 pg (ref 26.0–34.0)
MCHC: 34.8 g/dL (ref 32.0–36.0)
MCV: 92.1 fL (ref 80.0–100.0)
PLATELETS: 262 10*3/uL (ref 150–440)
RBC: 4.09 MIL/uL (ref 3.80–5.20)
RDW: 12.9 % (ref 11.5–14.5)
WBC: 8.9 10*3/uL (ref 3.6–11.0)

## 2015-11-26 LAB — SURGICAL PCR SCREEN
MRSA, PCR: NEGATIVE
STAPHYLOCOCCUS AUREUS: NEGATIVE

## 2015-11-26 SURGERY — LAPAROSCOPIC CHOLECYSTECTOMY WITH INTRAOPERATIVE CHOLANGIOGRAM
Anesthesia: General | Site: Abdomen | Wound class: Clean Contaminated

## 2015-11-26 MED ORDER — BUPIVACAINE HCL (PF) 0.5 % IJ SOLN
INTRAMUSCULAR | Status: AC
  Filled 2015-11-26: qty 30

## 2015-11-26 MED ORDER — KETOROLAC TROMETHAMINE 30 MG/ML IJ SOLN
INTRAMUSCULAR | Status: DC | PRN
  Administered 2015-11-26: 30 mg via INTRAVENOUS

## 2015-11-26 MED ORDER — GLYCOPYRROLATE 0.2 MG/ML IJ SOLN
INTRAMUSCULAR | Status: DC | PRN
  Administered 2015-11-26: 0.6 mg via INTRAVENOUS

## 2015-11-26 MED ORDER — LIDOCAINE HCL (CARDIAC) 20 MG/ML IV SOLN
INTRAVENOUS | Status: DC | PRN
  Administered 2015-11-26: 80 mg via INTRAVENOUS

## 2015-11-26 MED ORDER — ONDANSETRON HCL 4 MG/2ML IJ SOLN
INTRAMUSCULAR | Status: DC | PRN
  Administered 2015-11-26: 4 mg via INTRAVENOUS

## 2015-11-26 MED ORDER — ACETAMINOPHEN 10 MG/ML IV SOLN: INTRAVENOUS | Status: DC | PRN

## 2015-11-26 MED ORDER — ONDANSETRON HCL 4 MG/2ML IJ SOLN: 4.0000 mg | Freq: Once | INTRAMUSCULAR | Status: DC | PRN

## 2015-11-26 MED ORDER — BUPIVACAINE HCL (PF) 0.5 % IJ SOLN
INTRAMUSCULAR | Status: DC | PRN
  Administered 2015-11-26: 30 mL

## 2015-11-26 MED ORDER — FENTANYL CITRATE (PF) 100 MCG/2ML IJ SOLN: 25.0000 ug | INTRAMUSCULAR | Status: DC | PRN

## 2015-11-26 MED ORDER — SUCCINYLCHOLINE CHLORIDE 20 MG/ML IJ SOLN
INTRAMUSCULAR | Status: DC | PRN
  Administered 2015-11-26: 100 mg via INTRAVENOUS

## 2015-11-26 MED ORDER — ACETAMINOPHEN 10 MG/ML IV SOLN
INTRAVENOUS | Status: DC | PRN
  Administered 2015-11-26: 1000 mg via INTRAVENOUS

## 2015-11-26 MED ORDER — ONDANSETRON HCL 4 MG/2ML IJ SOLN
4.0000 mg | Freq: Once | INTRAMUSCULAR | Status: AC
  Administered 2015-11-26: 4 mg via INTRAVENOUS
  Filled 2015-11-26: qty 2

## 2015-11-26 MED ORDER — ACETAMINOPHEN 10 MG/ML IV SOLN
INTRAVENOUS | Status: AC
  Filled 2015-11-26: qty 100

## 2015-11-26 MED ORDER — PROMETHAZINE HCL 25 MG/ML IJ SOLN
6.2500 mg | Freq: Four times a day (QID) | INTRAMUSCULAR | Status: DC | PRN
  Administered 2015-11-26 (×2): 6.25 mg via INTRAVENOUS

## 2015-11-26 MED ORDER — FENTANYL CITRATE (PF) 100 MCG/2ML IJ SOLN
INTRAMUSCULAR | Status: DC | PRN
  Administered 2015-11-26 (×2): 100 ug via INTRAVENOUS

## 2015-11-26 MED ORDER — DEXAMETHASONE SODIUM PHOSPHATE 10 MG/ML IJ SOLN
INTRAMUSCULAR | Status: DC | PRN
  Administered 2015-11-26: 4 mg via INTRAVENOUS

## 2015-11-26 MED ORDER — PROPOFOL 10 MG/ML IV BOLUS
INTRAVENOUS | Status: DC | PRN
  Administered 2015-11-26: 150 mg via INTRAVENOUS

## 2015-11-26 MED ORDER — NEOSTIGMINE METHYLSULFATE 10 MG/10ML IV SOLN
INTRAVENOUS | Status: DC | PRN
  Administered 2015-11-26: 3 mg via INTRAVENOUS

## 2015-11-26 MED ORDER — MIDAZOLAM HCL 2 MG/2ML IJ SOLN
INTRAMUSCULAR | Status: DC | PRN
  Administered 2015-11-26: 2 mg via INTRAVENOUS

## 2015-11-26 MED ORDER — ROCURONIUM BROMIDE 100 MG/10ML IV SOLN
INTRAVENOUS | Status: DC | PRN
  Administered 2015-11-26: 40 mg via INTRAVENOUS

## 2015-11-26 MED ORDER — LACTATED RINGERS IV SOLN
INTRAVENOUS | Status: DC | PRN
  Administered 2015-11-26: 14:00:00 via INTRAVENOUS

## 2015-11-26 SURGICAL SUPPLY — 36 items
APPLIER CLIP 5 13 M/L LIGAMAX5 (MISCELLANEOUS) ×2
CANISTER SUCT 1200ML W/VALVE (MISCELLANEOUS) ×2 IMPLANT
CATH CHOLANGI 4FR 420404F (CATHETERS) ×2 IMPLANT
CHLORAPREP W/TINT 26ML (MISCELLANEOUS) ×2 IMPLANT
CLIP APPLIE 5 13 M/L LIGAMAX5 (MISCELLANEOUS) ×1 IMPLANT
CONRAY 60ML FOR OR (MISCELLANEOUS) ×2 IMPLANT
DEFOGGER SCOPE WARMER CLEARIFY (MISCELLANEOUS) ×2 IMPLANT
ELECT E-Z MONOPOLAR 33 (MISCELLANEOUS) ×2
ELECT REM PT RETURN 9FT ADLT (ELECTROSURGICAL) ×2
ELECTRODE E-Z MONOPOLAR 33 (MISCELLANEOUS) ×1 IMPLANT
ELECTRODE REM PT RTRN 9FT ADLT (ELECTROSURGICAL) ×1 IMPLANT
ENDOPOUCH RETRIEVER 10 (MISCELLANEOUS) ×2 IMPLANT
GLOVE PI ORTHOPRO 6.5 (GLOVE) ×1
GLOVE PI ORTHOPRO STRL 6.5 (GLOVE) ×1 IMPLANT
GOWN STRL REUS W/ TWL LRG LVL3 (GOWN DISPOSABLE) ×3 IMPLANT
GOWN STRL REUS W/TWL LRG LVL3 (GOWN DISPOSABLE) ×3
IRRIGATION STRYKERFLOW (MISCELLANEOUS) ×1 IMPLANT
IRRIGATOR STRYKERFLOW (MISCELLANEOUS) ×2
IV CATH ANGIO 12GX3 LT BLUE (NEEDLE) ×2 IMPLANT
IV NS 1000ML (IV SOLUTION) ×1
IV NS 1000ML BAXH (IV SOLUTION) ×1 IMPLANT
LABEL OR SOLS (LABEL) ×2 IMPLANT
LIQUID BAND (GAUZE/BANDAGES/DRESSINGS) ×2 IMPLANT
NEEDLE HYPO 25X1 1.5 SAFETY (NEEDLE) ×2 IMPLANT
NS IRRIG 500ML POUR BTL (IV SOLUTION) ×2 IMPLANT
PACK LAP CHOLECYSTECTOMY (MISCELLANEOUS) ×2 IMPLANT
PENCIL ELECTRO HAND CTR (MISCELLANEOUS) ×2 IMPLANT
SCISSORS METZENBAUM CVD 33 (INSTRUMENTS) ×2 IMPLANT
SLEEVE ENDOPATH XCEL 5M (ENDOMECHANICALS) ×4 IMPLANT
SUT MNCRL 4-0 (SUTURE) ×2
SUT MNCRL 4-0 27XMFL (SUTURE) ×2
SUT VICRYL 0 AB UR-6 (SUTURE) ×4 IMPLANT
SUTURE MNCRL 4-0 27XMF (SUTURE) ×2 IMPLANT
TROCAR XCEL BLUNT TIP 100MML (ENDOMECHANICALS) ×2 IMPLANT
TROCAR XCEL NON-BLD 5MMX100MML (ENDOMECHANICALS) ×2 IMPLANT
TUBING INSUFFLATOR HI FLOW (MISCELLANEOUS) ×2 IMPLANT

## 2015-11-26 NOTE — Anesthesia Procedure Notes (Signed)
Procedure Name: Intubation Performed by: Siriah Treat Pre-anesthesia Checklist: Patient identified, Patient being monitored, Timeout performed, Emergency Drugs available and Suction available Patient Re-evaluated:Patient Re-evaluated prior to inductionOxygen Delivery Method: Circle system utilized Preoxygenation: Pre-oxygenation with 100% oxygen Intubation Type: IV induction Ventilation: Mask ventilation without difficulty Laryngoscope Size: Mac and 3 Grade View: Grade I Tube type: Oral Tube size: 7.0 mm Number of attempts: 1 Airway Equipment and Method: Stylet Placement Confirmation: ETT inserted through vocal cords under direct vision,  positive ETCO2 and breath sounds checked- equal and bilateral Secured at: 21 cm Tube secured with: Tape Dental Injury: Teeth and Oropharynx as per pre-operative assessment        

## 2015-11-26 NOTE — Op Note (Signed)
Laparoscopic Cholecystectomy  Pre-operative Diagnosis: Acute cholecystitis  Post-operative Diagnosis: Same  Procedure: Laparoscopic cholecystectomy  Surgeon: Caroleen Hamman, MD FACS  Anesthesia: Gen. with endotracheal tube  Assistant: None   Findings: Acute Cholecystitis   Estimated Blood Loss: less than 20 cc         Drains: None         Specimens: Gallbladder           Complications: none   Procedure Details  The patient was seen again in the Holding Room. The benefits, complications, treatment options, and expected outcomes were discussed with the patient. The risks of bleeding, infection, recurrence of symptoms, failure to resolve symptoms, bile duct damage, bile duct leak, retained common bile duct stone, bowel injury, any of which could require further surgery and/or ERCP, stent, or papillotomy were reviewed with the patient. The likelihood of improving the patient's symptoms with return to their baseline status is good.  The patient and/or family concurred with the proposed plan, giving informed consent.  The patient was taken to Operating Room, identified as Danielle Strickland and the procedure verified as Laparoscopic Cholecystectomy.  A Time Out was held and the above information confirmed.  Prior to the induction of general anesthesia, antibiotic prophylaxis was administered. VTE prophylaxis was in place. General endotracheal anesthesia was then administered and tolerated well. After the induction, the abdomen was prepped with Chloraprep and draped in the sterile fashion. The patient was positioned in the supine position.  Local anesthetic  was injected into the skin near the umbilicus and an incision made. Cut down technique was used to enter the abdominal cavity and a Hasson trochar was placed after two vicryl stitches were anchored to the fascia. Pneumoperitoneum was then created with CO2 and tolerated well without any adverse changes in the patient's vital signs.  Three  5-mm ports were placed in the right upper quadrant all under direct vision. All skin incisions  were infiltrated with a local anesthetic agent before making the incision and placing the trocars.   The patient was positioned  in reverse Trendelenburg, tilted slightly to the patient's left.  The gallbladder was identified, the fundus grasped and retracted cephalad. Adhesions were lysed bluntly. The infundibulum was grasped and retracted laterally, exposing the peritoneum overlying the triangle of Calot. This was then divided and exposed in a blunt fashion. An extended critical view of the cystic duct and cystic artery was obtained.  The cystic duct was clearly identified and bluntly dissected.   Artery and duct were double clipped and divided.  The gallbladder was taken from the gallbladder fossa in a retrograde fashion with the electrocautery. The gallbladder was removed and placed in an Endocatch bag. The liver bed was irrigated and inspected. Hemostasis was achieved with the electrocautery. Copious irrigation was utilized and was repeatedly aspirated until clear.  The gallbladder and Endocatch sac were then removed through the epigastric port site.   Inspection of the right upper quadrant was performed. No bleeding, bile duct injury or leak, or bowel injury was noted. Pneumoperitoneum was released.  The periumbilical port site was closed with figure-of-eight 0 Vicryl sutures. 4-0 subcuticular Monocryl was used to close the skin. Dermabond was  applied.  The patient was then extubated and brought to the recovery room in stable condition. Sponge, lap, and needle counts were correct at closure and at the conclusion of the case.               Caroleen Hamman, MD, FACS

## 2015-11-26 NOTE — Transfer of Care (Signed)
Immediate Anesthesia Transfer of Care Note  Patient: Danielle Strickland  Procedure(s) Performed: Procedure(s): LAPAROSCOPIC CHOLECYSTECTOMY  (N/A)  Patient Location: PACU  Anesthesia Type:General  Level of Consciousness: awake, alert  and oriented  Airway & Oxygen Therapy: Patient Spontanous Breathing and Patient connected to face mask oxygen  Post-op Assessment: Report given to RN and Post -op Vital signs reviewed and stable  Post vital signs: Reviewed and stable  Last Vitals:  Filed Vitals:   11/26/15 0752 11/26/15 1313  BP: 112/55 107/49  Pulse: 92 92  Temp: 36.9 C 37.8 C  Resp: 18 14    Complications: No apparent anesthesia complications

## 2015-11-26 NOTE — Progress Notes (Signed)
Patient had cholecystectomy today.  Returned from PACU and complained of pain around umbical area.  PO pain medications given to manage pain.  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  Tolerating diet.

## 2015-11-26 NOTE — Telephone Encounter (Signed)
Patient called to cancel her appointment for tomorrow.  Patient's at [facility].  She's having her gallbladder removed today.  The follow up appointment was to refill her Diazapam.  Patient said she'll try to come in for an appointment next week.
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
## 2015-11-26 NOTE — Progress Notes (Signed)
Preoperative Review   Patient is met in the preoperative holding area. The history is reviewed in the chart and with the patient. I personally reviewed the options and rationale as well as the risks of this procedure that have been previously discussed with the patient. All questions asked by the patient and/or family were answered to their satisfaction.  Patient agrees to proceed with this procedure at this time.  Procedure explained in detail, risks , benefits and possible complications. Questions answered  Caroleen Hamman M.D. FACS

## 2015-11-26 NOTE — Anesthesia Preprocedure Evaluation (Signed)
Anesthesia Evaluation  Patient identified by MRN, date of birth, ID band Patient awake    Reviewed: Allergy & Precautions, NPO status , Patient's Chart, lab work & pertinent test results, reviewed documented beta blocker date and time   Airway Mallampati: II  TM Distance: >3 FB     Dental  (+) Chipped   Pulmonary shortness of breath, former smoker,           Cardiovascular      Neuro/Psych PSYCHIATRIC DISORDERS    GI/Hepatic GERD  ,  Endo/Other    Renal/GU      Musculoskeletal   Abdominal   Peds  Hematology   Anesthesia Other Findings   Reproductive/Obstetrics                             Anesthesia Physical Anesthesia Plan  ASA: II  Anesthesia Plan: General   Post-op Pain Management:    Induction: Intravenous  Airway Management Planned: Oral ETT  Additional Equipment:   Intra-op Plan:   Post-operative Plan:   Informed Consent: I have reviewed the patients History and Physical, chart, labs and discussed the procedure including the risks, benefits and alternatives for the proposed anesthesia with the patient or authorized representative who has indicated his/her understanding and acceptance.     Plan Discussed with: CRNA  Anesthesia Plan Comments:         Anesthesia Quick Evaluation

## 2015-11-27 ENCOUNTER — Ambulatory Visit: Payer: Self-pay | Admitting: Family Medicine

## 2015-11-27 ENCOUNTER — Ambulatory Visit: Payer: BLUE CROSS/BLUE SHIELD | Admitting: Physical Therapy

## 2015-11-27 MED ORDER — OXYCODONE-ACETAMINOPHEN 10-325 MG PO TABS: 1.0000 | ORAL_TABLET | ORAL | Status: DC | PRN

## 2015-11-27 NOTE — Anesthesia Postprocedure Evaluation (Signed)
Anesthesia Post Note  Patient: Danielle Strickland  Procedure(s) Performed: Procedure(s) (LRB): LAPAROSCOPIC CHOLECYSTECTOMY  (N/A)  Patient location during evaluation: PACU Anesthesia Type: General Level of consciousness: awake and alert Pain management: pain level controlled Vital Signs Assessment: post-procedure vital signs reviewed and stable Respiratory status: spontaneous breathing, nonlabored ventilation, respiratory function stable and patient connected to nasal cannula oxygen Cardiovascular status: blood pressure returned to baseline and stable Postop Assessment: no signs of nausea or vomiting Anesthetic complications: no    Last Vitals:  Filed Vitals:   11/26/15 2046 11/27/15 0500  BP: 107/50 92/51  Pulse: 113 93  Temp: 36.6 C 36.6 C  Resp: 18 18    Last Pain:  Filed Vitals:   11/27/15 0500  PainSc: [place]

## 2015-11-27 NOTE — Discharge Summary (Signed)
Patient ID: Danielle Strickland MRN: [id number] DOB/AGE: September 08, 1966 50 y.o.  Admit date: 11/25/2015 Discharge date: 11/27/2015  Discharge Diagnoses:  Acute cholecystitis  Procedures Performed: same  Discharged Condition: Stable  Hospital Course: 50 yo female admitted for abdominal pain, N and V. W/U revealed evidence of early cholecystitis. She underwent an uneventful lap cholecystectomy. At the time of discharge she was ambulating, tolerating PO, her pain was under controlled she was afebrile and was feeling much better. Her incisions were c/d/i and no evidence of infection.  Discharge Orders: Discharge Instructions    Call MD for:  difficulty breathing, headache or visual disturbances    Complete by:  As directed      Call MD for:  extreme fatigue    Complete by:  As directed      Call MD for:  persistant nausea and vomiting    Complete by:  As directed      Call MD for:  redness, tenderness, or signs of infection (pain, swelling, redness, odor or green/yellow discharge around incision site)    Complete by:  As directed      Call MD for:  severe uncontrolled pain    Complete by:  As directed      Call MD for:  temperature >100.4    Complete by:  As directed      Diet - low sodium heart healthy    Complete by:  As directed      Increase activity slowly    Complete by:  As directed      Lifting restrictions    Complete by:  As directed   No heavy lifting < 20 lbs. May resume PT for his ankle next week     No dressing needed    Complete by:  As directed   May shower tomorrow           Disposition: 01-Home or Self Care  Discharge Medications:  Current facility-administered medications:  .  acetaminophen (TYLENOL) tablet 650 mg, 650 mg, Oral, Q6H PRN **OR** acetaminophen (TYLENOL) suppository 650 mg, 650 mg, Rectal, Q6H PRN, Dia Crawford III, MD .  dextrose 5 % and 0.45 % NaCl with KCl 20 mEq/L infusion, , Intravenous, Continuous, Dia Crawford III, MD, Last Rate: 75 mL/hr at  11/27/15 0416 .  diazepam (VALIUM) tablet 5 mg, 5 mg, Oral, Q8H PRN, Dia Crawford III, MD, 5 mg at 11/27/15 1534 .  enoxaparin (LOVENOX) injection 40 mg, 40 mg, Subcutaneous, Q24H, Dia Crawford III, MD, 40 mg at 11/27/15 1925 .  HYDROmorphone (DILAUDID) injection 1 mg, 1 mg, Intravenous, Q2H PRN, Dia Crawford III, MD, 1 mg at 11/25/15 2132 .  ondansetron (ZOFRAN-ODT) disintegrating tablet 4 mg, 4 mg, Oral, Q6H PRN **OR** ondansetron (ZOFRAN) injection 4 mg, 4 mg, Intravenous, Q6H PRN, Dia Crawford III, MD, 4 mg at 11/26/15 [id number] .  oxyCODONE (Oxy IR/ROXICODONE) immediate release tablet 5-10 mg, 5-10 mg, Oral, Q4H PRN, Dia Crawford III, MD, 10 mg at 11/27/15 1911 .  pantoprazole (PROTONIX) injection 40 mg, 40 mg, Intravenous, QHS, Dia Crawford III, MD, 40 mg at 11/26/15 2121 .  promethazine (PHENERGAN) injection 6.25 mg, 6.25 mg, Intravenous, Q6H PRN, Gunnar Bulla, MD, 6.25 mg at 11/26/15 1326  Follwup: Follow-up Information    Follow up with [facility] In 2 weeks.   Why:  post op   Contact information:   [address] [place] [id number]      Signed: Jules Husbands 11/27/2015,  8:03 PM   Caroleen Hamman, MD FACS

## 2015-11-27 NOTE — Discharge Instructions (Signed)

## 2015-11-27 NOTE — Progress Notes (Signed)
Patient is being discharged, aftercare and follow up explained. Patient have no complaints at this time.

## 2015-11-28 ENCOUNTER — Ambulatory Visit: Payer: BLUE CROSS/BLUE SHIELD | Admitting: Physical Therapy

## 2015-11-28 ENCOUNTER — Ambulatory Visit: Payer: Self-pay | Admitting: Psychology

## 2015-11-28 LAB — SURGICAL PATHOLOGY

## 2015-11-29 ENCOUNTER — Ambulatory Visit: Payer: BLUE CROSS/BLUE SHIELD | Admitting: Physical Therapy

## 2015-12-04 ENCOUNTER — Ambulatory Visit: Payer: BLUE CROSS/BLUE SHIELD | Admitting: Physical Therapy

## 2015-12-04 ENCOUNTER — Encounter: Payer: Self-pay | Admitting: Family Medicine

## 2015-12-04 ENCOUNTER — Ambulatory Visit (INDEPENDENT_AMBULATORY_CARE_PROVIDER_SITE_OTHER): Payer: Self-pay | Admitting: Family Medicine

## 2015-12-04 VITALS — BP 114/62 | HR 108 | Temp 97.9°F | Wt 165.5 lb

## 2015-12-04 DIAGNOSIS — K8010 Calculus of gallbladder with chronic cholecystitis without obstruction: Secondary | ICD-10-CM

## 2015-12-04 DIAGNOSIS — Z8669 Personal history of other diseases of the nervous system and sense organs: Secondary | ICD-10-CM

## 2015-12-04 MED ORDER — DIAZEPAM 5 MG PO TABS: 5.0000 mg | ORAL_TABLET | Freq: Three times a day (TID) | ORAL | Status: DC | PRN

## 2015-12-04 NOTE — Assessment & Plan Note (Addendum)
S/p lap cholecystectomy on 11/16/15 Recovering as expected. Keep scheduled appt with surgery next week. Valium rx refilled today.

## 2015-12-04 NOTE — Progress Notes (Signed)
Pre visit review using our clinic review tool, if applicable. No additional management support is needed unless otherwise documented below in the visit note. 

## 2015-12-04 NOTE — Progress Notes (Signed)
Subjective:   Patient ID: Danielle Strickland, female    DOB: 01/17/1966, 50 y.o.   MRN: [id number]  Danielle Strickland is a pleasant 50 y.o. year old female who presents to clinic today with Follow-up  on 12/04/2015  HPI:  Hospital follow up- presented to ER on 11/25/15 with abdominal pain, nausea and vomiting. Notes and studies reviewed.  Work up showed evidence of early cholecystitis.  Underwent Lap cholecystectomy (Dr. Dahlia Byes) on 11/26/15 which was uneventful.  At time of d/c on 11/27/2015, she was tolerating po and pain well controlled.    Advised to follow up with surgeon within 2 weeks of discharge.  Has appt scheduled for 12/10/15.  She needs her valium rx refilled today for muscle spasms.  Feeling ok- still cant get comfortable at night.  Incision area hurts and hurts to take deep breaths.  No fevers or discharge from incisions.   Current Outpatient Prescriptions on File Prior to Visit  Medication Sig Dispense Refill  . acyclovir (ZOVIRAX) 400 MG tablet Take 400 mg by mouth daily as needed (for outbreaks).    . diazepam (VALIUM) 5 MG tablet Take 5 mg by mouth every 8 (eight) hours as needed for muscle spasms.    . fexofenadine (ALLEGRA) 180 MG tablet Take 180 mg by mouth daily.    . Linaclotide (LINZESS) 145 MCG CAPS capsule Take 1 capsule (145 mcg total) by mouth daily as needed. (Patient taking differently: Take 145 mcg by mouth daily as needed (for constipation). ) 30 capsule 6  . oxyCODONE-acetaminophen (PERCOCET) 10-325 MG tablet Take 1 tablet by mouth every 4 (four) hours as needed for pain. 41 tablet 0  . pantoprazole (PROTONIX) 40 MG tablet Take 1 tablet (40 mg total) by mouth daily. 30 tablet 6  . promethazine (PHENERGAN) 25 MG tablet Take 25 mg by mouth every 6 (six) hours as needed for nausea or vomiting.    . SUMAtriptan (IMITREX) 50 MG tablet Take 50 mg by mouth every 2 (two) hours as needed for migraine.      No current facility-administered medications on file prior  to visit.    Allergies  Allergen Reactions  . Augmentin [Amoxicillin-Pot Clavulanate] Nausea And Vomiting and Other (See Comments)    Has patient had a PCN reaction causing immediate rash, facial/tongue/throat swelling, SOB or lightheadedness with hypotension: No Has patient had a PCN reaction causing severe rash involving mucus membranes or skin necrosis: No Has patient had a PCN reaction that required hospitalization No Has patient had a PCN reaction occurring within the last 10 years: No If all of the above answers are "NO", then may proceed with Cephalosporin use.  . Codeine Nausea And Vomiting  . Latex Rash  . Aspirin Nausea And Vomiting    Past Medical History  Diagnosis Date  . HPV in female   . Abnormal Pap smear of vagina and vaginal HPV     ascus   . Migraines   . Microscopic hematuria   . Mental disorder     anxiety  . Endometriosis   . 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 GERD (gastroesophageal reflux disease)   . IBS (irritable bowel syndrome)   . Liver disease     H/O hepatitis  . Allergy   . Peptic ulcer disease   . History of migraine   . Skin cancer     left thigh    Past Surgical History  Procedure Laterality Date  .  Laparoscopically assisted vaginal hysterectomy  2006  . Tubal ligation    . Wisdom tooth extraction    . Salivary gland surgery    . Abdominal hysterectomy    . Appendectomy    . Cholecystectomy N/A 11/26/2015    Procedure: LAPAROSCOPIC CHOLECYSTECTOMY ;  Surgeon: Jules Husbands, MD;  Location: [facility] ORS;  Service: General;  Laterality: N/A;    Family History  Problem Relation Age of Onset  . Cancer Mother     colon   . Thyroid cancer Maternal Aunt   . Breast cancer Maternal Grandmother   . Breast cancer Maternal Aunt     Social History   Social History  . Marital Status: Legally Separated    Spouse Name: N/A  . Number of Children: N/A  . Years of Education: N/A   Occupational History  . Not on  file.   Social History Main Topics  . Smoking status: Former Smoker -- 1.00 packs/day    Types: Cigarettes    Quit date: 03/03/2015  . Smokeless tobacco: Never Used     Comment: Quit May 8  . Alcohol Use: No  . Drug Use: No  . Sexual Activity: Not Currently    Birth Control/ Protection: Surgical     Comment: hyst/BTL    Other Topics Concern  . Not on file   Social History Narrative   [phone]   Married to fourth husband   The PMH, PSH, Social History, Family History, Medications, and allergies have been reviewed in [facility], and have been updated if relevant.   Review of Systems  Constitutional: Negative.   HENT: Negative.   Respiratory: Negative.   Cardiovascular: Negative.   Gastrointestinal: Negative.   Endocrine: Negative.   Genitourinary: Negative.   Musculoskeletal: Negative.   Skin: Negative.   Allergic/Immunologic: Negative.   Neurological: Negative.   Hematological: Negative.   Psychiatric/Behavioral: Negative.   All other systems reviewed and are negative.      Objective:    Wt 165 lb 8 oz (75.07 kg)   Physical Exam  Constitutional: She is oriented to person, place, and time. She appears well-developed and well-nourished. No distress.  HENT:  Head: Normocephalic.  Eyes: Conjunctivae are normal.  Cardiovascular: Normal rate.   Pulmonary/Chest: Effort normal.  Abdominal: She exhibits no mass. There is tenderness. There is guarding. There is no rebound.  Musculoskeletal: Normal range of motion.  Neurological: She is alert and oriented to person, place, and time. No cranial nerve deficit.  Skin: Skin is warm and dry. She is not diaphoretic.  Incision on abdomen c/d/i  Psychiatric: She has a normal mood and affect. Her behavior is normal. Judgment and thought content normal.  Nursing note and vitals reviewed.         Assessment & Plan:   Cholecystitis with cholelithiasis  History of migraine No Follow-up on file.

## 2015-12-06 ENCOUNTER — Encounter: Payer: Self-pay | Admitting: Physical Therapy

## 2015-12-09 ENCOUNTER — Encounter: Payer: Self-pay | Admitting: Family Medicine

## 2015-12-09 ENCOUNTER — Ambulatory Visit (INDEPENDENT_AMBULATORY_CARE_PROVIDER_SITE_OTHER): Payer: Self-pay | Admitting: Family Medicine

## 2015-12-09 ENCOUNTER — Ambulatory Visit (INDEPENDENT_AMBULATORY_CARE_PROVIDER_SITE_OTHER)
Admission: RE | Admit: 2015-12-09 | Discharge: 2015-12-09 | Disposition: A | Discharge status: Home / Self Care | Payer: Self-pay | Source: Ambulatory Visit | Attending: Family Medicine | Admitting: Family Medicine

## 2015-12-09 VITALS — BP 102/70 | HR 93 | Temp 98.2°F | Ht 64.25 in | Wt 168.5 lb

## 2015-12-09 DIAGNOSIS — G90521 Complex regional pain syndrome I of right lower limb: Secondary | ICD-10-CM

## 2015-12-09 DIAGNOSIS — S82891S Other fracture of right lower leg, sequela: Secondary | ICD-10-CM

## 2015-12-09 DIAGNOSIS — M25571 Pain in right ankle and joints of right foot: Secondary | ICD-10-CM

## 2015-12-09 MED ORDER — NORTRIPTYLINE HCL 10 MG PO CAPS: 10.0000 mg | ORAL_CAPSULE | Freq: Every day | ORAL | Status: DC
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
## 2015-12-09 NOTE — Progress Notes (Signed)
Pre visit review using our clinic review tool, if applicable. No additional management support is needed unless otherwise documented below in the visit note. 

## 2015-12-10 ENCOUNTER — Encounter: Payer: Self-pay | Admitting: Physical Therapy

## 2015-12-10 ENCOUNTER — Ambulatory Visit: Payer: Self-pay | Attending: Orthopedic Surgery | Admitting: Physical Therapy

## 2015-12-10 ENCOUNTER — Encounter: Payer: Self-pay | Admitting: Surgery

## 2015-12-10 DIAGNOSIS — R531 Weakness: Secondary | ICD-10-CM | POA: Insufficient documentation

## 2015-12-10 DIAGNOSIS — R262 Difficulty in walking, not elsewhere classified: Secondary | ICD-10-CM | POA: Insufficient documentation

## 2015-12-10 DIAGNOSIS — M25571 Pain in right ankle and joints of right foot: Secondary | ICD-10-CM | POA: Insufficient documentation

## 2015-12-10 DIAGNOSIS — S82899A Other fracture of unspecified lower leg, initial encounter for closed fracture: Secondary | ICD-10-CM | POA: Insufficient documentation

## 2015-12-10 DIAGNOSIS — G90529 Complex regional pain syndrome I of unspecified lower limb: Secondary | ICD-10-CM | POA: Insufficient documentation

## 2015-12-10 NOTE — Therapy (Signed)
[place] MAIN [facility] SERVICES [address] [place], [place], [address] Phone: [phone]   Fax:  [phone]  Physical Therapy Treatment  Patient Details  Name: Danielle Strickland MRN: [id number] Date of Birth: [birth year] Referring Provider: Wylene Simmer MD  Encounter Date: 12/10/2015      PT End of Session - 12/10/15 1146    Visit Number 5   Number of Visits 17   Date for PT Re-Evaluation 01/09/16   PT Start Time 1108   PT Stop Time 1156   PT Time Calculation (min) 48 min   Activity Tolerance Patient limited by pain   Behavior During Therapy Anxious      Past Medical History  Diagnosis Date  . HPV in female   . Abnormal Pap smear of vagina and vaginal HPV     ascus   . Migraines   . Microscopic hematuria   . Mental disorder     anxiety  . Endometriosis   . 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 GERD (gastroesophageal reflux disease)   . IBS (irritable bowel syndrome)   . Liver disease     H/O hepatitis  . Allergy   . Peptic ulcer disease   . History of migraine   . Skin cancer     left thigh    Past Surgical History  Procedure Laterality Date  . Laparoscopically assisted vaginal hysterectomy  2006  . Tubal ligation    . Wisdom tooth extraction    . Salivary gland surgery    . Abdominal hysterectomy    . Appendectomy    . Cholecystectomy N/A 11/26/2015    Procedure: LAPAROSCOPIC CHOLECYSTECTOMY ;  Surgeon: Jules Husbands, MD;  Location: [facility] ORS;  Service: General;  Laterality: N/A;    There were no vitals filed for this visit.  Visit Diagnosis:  Right ankle pain  Difficulty walking  Weakness      Subjective Assessment - 12/10/15 1118    Subjective Patient is s/p gallbladder removal surgery: She has been released to return to PT with weight lifting restriction of 20#; Patient reports that she has been walking without boot since last visit; She reports being able to walk for about 30-1 hour  without boot;   Pertinent History Personal factors affecting rehab: anxiety/depression, fear of injuring right ankle, current fracture in ankle, severe pain, decreased balance and high fall risk, decreased caregiver support at home;    How long can you sit comfortably? feels tingling/burning;    How long can you stand comfortably? 15 min   How long can you walk comfortably? never comfortable with walking; increased pain with all walking;    Diagnostic tests MRI in September of right ankle and was diagnosed with ankle fracture; her last x-ray shows that it is still broken but is healing from the outside in;    Currently in Pain? Yes   Pain Score 5    Pain Location Ankle   Pain Orientation Right   Multiple Pain Sites Yes   Pain Score 3   Pain Location Abdomen   Pain Descriptors / Indicators Burning   Pain Type Acute pain       TREATMENT:  Warm up on Recumbent bike with full revolution x5 min (unbilled);  Incline calf stretch 20 sec hold x2; Rockerboard forward/backward, side/side teeter x15 reps each, with B rail assist;Patient required mod VCS to increase weight shift and increase ankle ROM for better strengthening. She  is hesitant with forward/backward rockerboard due to ankle discomfort.   Long sitting red tband ankleDF, PF, IV/EV x10 each with mod VCS to isolate ankle motion and increase ROM for strengthening; Advanced HEP with providing red tband for increased strengthening;  Patient slow with all exercise requiring increased time to complete task.  Finished with moist heat to right ankle x10 min (unbilled);                           PT Education - 12/10/15 1420    Education provided Yes   Education Details ankle strengthening exercise, HEP   Person(s) Educated Patient   Methods Explanation;Verbal cues   Comprehension Returned demonstration;Verbalized understanding;Verbal cues required             PT Long Term Goals - 11/14/15 1209    PT LONG  TERM GOAL #1   Title Patient will be independent in home exercise program to improve strength/mobility for better functional independence with ADLs by 01/09/16   Time 8   Period Weeks   Status New   PT LONG TERM GOAL #2   Title Patient (< 64 years old) will complete five times sit to stand test in < 10 seconds indicating an increased LE strength and improved balance by 01/09/16   Time 8   Period Weeks   Status New   PT LONG TERM GOAL #3   Title Patient will increase 10 meter walk test to >1.82m/s as to improve gait speed for better community ambulation and to reduce fall risk. by 01/09/16   Time 8   Period Weeks   Status New   PT LONG TERM GOAL #4   Title Patient will ascend/descend 4 stairs with 1 rail assist independently forward reciprocally without loss of balance to improve ability to get in/out of home. by 01/09/16   Time 8   Period Weeks   Status New   PT LONG TERM GOAL #5   Title Patient will report a worst pain of 3/10 on VAS in  right ankle           to improve tolerance with ADLs and reduced symptoms with activities. by 01/09/16   Time 8   Period Weeks   Status New   Additional Long Term Goals   Additional Long Term Goals Yes   PT LONG TERM GOAL #6   Title Patient will increase lower extremity functional scale to >60/80 to demonstrate improved functional mobility and increased tolerance with ADLs. by 01/09/16   Time 8   Period Weeks   Status New   PT LONG TERM GOAL #7   Title Patient will increase RLE ankle AROM DF: >5 degrees, PF: >60 degrees, IV: >15 degrees, EV: >15 degrees for increased functional ROM to improve foot clearance with gait tasks by 01/09/16   Time 4   Period Weeks   Status New   PT LONG TERM GOAL #8   Title Patient will increase BLE gross strength to 4+/5 as to improve functional strength for independent gait, increased standing tolerance and increased ADL ability. by 01/09/16   Time 8   Period Weeks   Status New               Plan -  12/10/15 1420    Clinical Impression Statement Instructed patient in RLE ankle strengthening/stability exercise. She was limited in movement due to pain and weakness. Patient required increased cues (verbal and tactile) to increase ROM for  better strengthening. Patient would benefit from additional skilled PT intervention to improve LE strength and mobility.    Pt will benefit from skilled therapeutic intervention in order to improve on the following deficits Abnormal gait;Decreased endurance;Impaired sensation;Decreased activity tolerance;Decreased strength;Pain;Decreased balance;Decreased mobility;Difficulty walking;Decreased range of motion;Decreased safety awareness;Impaired flexibility   Rehab Potential Fair   Clinical Impairments Affecting Rehab Potential positive: motivation to get better, negative: chronic condition, anxious, co-morbidities; Clinical presentation is unstable as her pain comes and goes with no pattern, severity of pain limits mobility, uncontrolled anxiety   PT Frequency 2x / week   PT Duration 8 weeks   PT Treatment/Interventions Cryotherapy;Moist Heat;Balance training;Therapeutic exercise;Therapeutic activities;Functional mobility training;Stair training;Gait training;ADLs/Self Care Home Management;Neuromuscular re-education;Patient/family education;Manual techniques;Taping;Energy conservation;Dry needling;Passive range of motion   PT Next Visit Plan work on tband strengthening, BAPs, isometric   PT Home Exercise Plan continue as previously given;    Consulted and Agree with Plan of Care Patient        Problem List Patient Active Problem List   Diagnosis Date Noted  . Cholecystitis with cholelithiasis 11/25/2015  . Hx MRSA infection 06/18/2015  . Abnormal chest x-ray 05/29/2015  . Family history of colon cancer 02/21/2013  . TMJ (dislocation of temporomandibular joint) 02/21/2013  . Tobacco abuse 02/21/2013  . Allergy   . Skin cancer   . Peptic ulcer disease   .  Generalized anxiety disorder   . Herpes simplex without mention of complication   . IBS (irritable bowel syndrome)   . History of migraine     Annamaria Salah PT, DPT 12/10/2015, 2:23 PM  [place] MAIN [facility] SERVICES [address] [place], [place], [address] Phone: [phone]   Fax:  [phone]  Name: Danielle Strickland MRN: [id number] Date of Birth: [birth year]

## 2015-12-13 ENCOUNTER — Ambulatory Visit (INDEPENDENT_AMBULATORY_CARE_PROVIDER_SITE_OTHER): Payer: Self-pay | Admitting: General Surgery

## 2015-12-13 ENCOUNTER — Encounter: Payer: Self-pay | Admitting: Physical Therapy

## 2015-12-13 ENCOUNTER — Encounter: Payer: Self-pay | Admitting: General Surgery

## 2015-12-13 ENCOUNTER — Ambulatory Visit: Payer: Self-pay | Admitting: Physical Therapy

## 2015-12-13 VITALS — BP 96/62 | HR 97 | Temp 98.4°F | Ht 64.0 in | Wt 167.0 lb

## 2015-12-13 DIAGNOSIS — M25571 Pain in right ankle and joints of right foot: Secondary | ICD-10-CM

## 2015-12-13 DIAGNOSIS — R531 Weakness: Secondary | ICD-10-CM

## 2015-12-13 DIAGNOSIS — Z4889 Encounter for other specified surgical aftercare: Secondary | ICD-10-CM

## 2015-12-13 DIAGNOSIS — R262 Difficulty in walking, not elsewhere classified: Secondary | ICD-10-CM

## 2015-12-13 NOTE — Therapy (Signed)
[place] MAIN [facility] SERVICES [address] [place], [place], [address] Phone: [phone]   Fax:  [phone]  Physical Therapy Treatment  Patient Details  Name: Danielle Strickland MRN: [id number] Date of Birth: 10-Sep-1966 Referring Provider: Wylene Simmer MD  Encounter Date: 12/13/2015      PT End of Session - 12/13/15 1059    Visit Number 6   Number of Visits 17   Date for PT Re-Evaluation 01/09/16   PT Start Time 1050   PT Stop Time 1130   PT Time Calculation (min) 40 min   Activity Tolerance Patient limited by pain   Behavior During Therapy Anxious      Past Medical History  Diagnosis Date  . HPV in female   . Abnormal Pap smear of vagina and vaginal HPV     ascus   . Migraines   . Microscopic hematuria   . Mental disorder     anxiety  . Endometriosis   . 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 GERD (gastroesophageal reflux disease)   . IBS (irritable bowel syndrome)   . Liver disease     H/O hepatitis  . Allergy   . Peptic ulcer disease   . History of migraine   . Skin cancer     left thigh    Past Surgical History  Procedure Laterality Date  . Laparoscopically assisted vaginal hysterectomy  2006  . Tubal ligation    . Wisdom tooth extraction    . Salivary gland surgery    . Abdominal hysterectomy    . Appendectomy    . Cholecystectomy N/A 11/26/2015    Procedure: LAPAROSCOPIC CHOLECYSTECTOMY ;  Surgeon: Jules Husbands, MD;  Location: [facility] ORS;  Service: General;  Laterality: N/A;    There were no vitals filed for this visit.  Visit Diagnosis:  Right ankle pain  Difficulty walking  Weakness      Subjective Assessment - 12/13/15 1055    Subjective Patient reports having increased "ache" in right ankle at night;    Pertinent History Personal factors affecting rehab: anxiety/depression, fear of injuring right ankle, current fracture in ankle, severe pain, decreased balance and high fall  risk, decreased caregiver support at home;    How long can you sit comfortably? feels tingling/burning;    How long can you stand comfortably? 15 min   How long can you walk comfortably? never comfortable with walking; increased pain with all walking;    Diagnostic tests MRI in September of right ankle and was diagnosed with ankle fracture; her last x-ray shows that it is still broken but is healing from the outside in;    Currently in Pain? Yes   Pain Score 5    Pain Location Ankle   Pain Orientation Right   Pain Descriptors / Indicators Aching;Burning   Pain Type Chronic pain   Pain Onset More than a month ago         TREATMENT: Warm up on recumbent bike x4 min (Unbilled);  Standing: Heel/toe raises with both legs x10; Alternate march x2 min with finger tip hold for balance;  Side step on narrow beam with heel/toe off x1 lap each; Forward lunges on narrow beam with 1 HHA x2 laps;  Side lunges on firm surface x5 each direction with mod VCS for correct positioning and to maintain stance leg straight for better strengthening and technique;  SLS on star step outs with toe taps on  opposite leg x1 each with CGA during RLE SLS;  Long sitting: Red tband ankle DF, IV/EV x12 each with mod VCs to isolate just ankle ROM for better strengthening. She has increased difficulty with ankle EV/IV due to pain and weakness;  Instructed patient in right LE ankle alphabet A-Z with tactile cues to reduce LE hip/knee movement and isolate just ankle ROM;  Advanced HEP- see patient instructions;  Patient required min-moderate verbal/tactile cues for correct exercise technique with all exercise. She reports less stiffness after treatment session;                         PT Education - 12/13/15 1058    Education provided Yes   Education Details ankle strengthening, HEP   Person(s) Educated Patient   Methods Explanation;Verbal cues   Comprehension Verbalized  understanding;Returned demonstration;Verbal cues required             PT Long Term Goals - 11/14/15 1209    PT LONG TERM GOAL #1   Title Patient will be independent in home exercise program to improve strength/mobility for better functional independence with ADLs by 01/09/16   Time 8   Period Weeks   Status New   PT LONG TERM GOAL #2   Title Patient (< 72 years old) will complete five times sit to stand test in < 10 seconds indicating an increased LE strength and improved balance by 01/09/16   Time 8   Period Weeks   Status New   PT LONG TERM GOAL #3   Title Patient will increase 10 meter walk test to >1.75m/s as to improve gait speed for better community ambulation and to reduce fall risk. by 01/09/16   Time 8   Period Weeks   Status New   PT LONG TERM GOAL #4   Title Patient will ascend/descend 4 stairs with 1 rail assist independently forward reciprocally without loss of balance to improve ability to get in/out of home. by 01/09/16   Time 8   Period Weeks   Status New   PT LONG TERM GOAL #5   Title Patient will report a worst pain of 3/10 on VAS in  right ankle           to improve tolerance with ADLs and reduced symptoms with activities. by 01/09/16   Time 8   Period Weeks   Status New   Additional Long Term Goals   Additional Long Term Goals Yes   PT LONG TERM GOAL #6   Title Patient will increase lower extremity functional scale to >60/80 to demonstrate improved functional mobility and increased tolerance with ADLs. by 01/09/16   Time 8   Period Weeks   Status New   PT LONG TERM GOAL #7   Title Patient will increase RLE ankle AROM DF: >5 degrees, PF: >60 degrees, IV: >15 degrees, EV: >15 degrees for increased functional ROM to improve foot clearance with gait tasks by 01/09/16   Time 4   Period Weeks   Status New   PT LONG TERM GOAL #8   Title Patient will increase BLE gross strength to 4+/5 as to improve functional strength for independent gait, increased  standing tolerance and increased ADL ability. by 01/09/16   Time 8   Period Weeks   Status New               Plan - 12/13/15 1056    Clinical Impression Statement Instructed patient in BLE strengthening  and RLE ankle stability exercise. Patient very hesitant at trying new exercise due to fear of pain and weakness. She required increased cues for  correct exercise. technique. patient responded well to treatment session with less stiffness. She would benefit from additional skilled PT intervention to improve LE strength, balance and gait safety while reducing RLE ankle pain.    Pt will benefit from skilled therapeutic intervention in order to improve on the following deficits Abnormal gait;Decreased endurance;Impaired sensation;Decreased activity tolerance;Decreased strength;Pain;Decreased balance;Decreased mobility;Difficulty walking;Decreased range of motion;Decreased safety awareness;Impaired flexibility   Rehab Potential Fair   Clinical Impairments Affecting Rehab Potential positive: motivation to get better, negative: chronic condition, anxious, co-morbidities; Clinical presentation is unstable as her pain comes and goes with no pattern, severity of pain limits mobility, uncontrolled anxiety   PT Frequency 2x / week   PT Duration 8 weeks   PT Treatment/Interventions Cryotherapy;Moist Heat;Balance training;Therapeutic exercise;Therapeutic activities;Functional mobility training;Stair training;Gait training;ADLs/Self Care Home Management;Neuromuscular re-education;Patient/family education;Manual techniques;Taping;Energy conservation;Dry needling;Passive range of motion   PT Next Visit Plan check goals, work on stability exercise   PT Home Exercise Plan advanced- see patient instructions;    Consulted and Agree with Plan of Care Patient        Problem List Patient Active Problem List   Diagnosis Date Noted  . Malleolar fracture 12/10/2015  . Complex regional pain syndrome of lower  extremity 12/10/2015  . Cholecystitis with cholelithiasis 11/25/2015  . Hx MRSA infection 06/18/2015  . Abnormal chest x-ray 05/29/2015  . Family history of colon cancer 02/21/2013  . TMJ (dislocation of temporomandibular joint) 02/21/2013  . Tobacco abuse 02/21/2013  . Allergy   . Skin cancer   . Peptic ulcer disease   . Generalized anxiety disorder   . Herpes simplex without mention of complication   . IBS (irritable bowel syndrome)   . History of migraine     Trotter,Margaret PT, DPT 12/13/2015, 11:47 AM  [place] MAIN [facility] SERVICES [address] [place], [place], [address] Phone: [phone]   Fax:  [phone]  Name: Danielle Strickland MRN: [id number] Date of Birth: [birth year]

## 2015-12-13 NOTE — Patient Instructions (Addendum)
Please give Danielle Strickland a call if you have any questions or concerns.  Please do not lift more than 20 lbs for another two weeks from today 12/13/2015.  Pathology showed that your gallbladder was benign.  Please give Danielle Strickland a call if you notice any fever, drainage and/or redness.   For a year, you will need to apply extra Suncreen on your incisions if you go to the pool or the beach.  At this time your are able to drive or ride a bike if you are capable to.

## 2015-12-13 NOTE — Patient Instructions (Addendum)
  Forward Walking    Step forward one leg. Bend both knees, doing a forward lunge. Head and chest up. Push from hips and back foot; cycle through to opposite leg forward, switching arms. Left then right is one rep. Do __1_ sets __5-10_ reps with each leg.  Copyright  VHI. All rights reserved.  Ankle Alphabet    Using left ankle and foot only, trace the letters of the alphabet. Perform A to Z. Repeat _1___ times per set. Do __1__ sets per session. Do _2___ sessions per day.  http://orth.exer.us/17   Copyright  VHI. All rights reserved.
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
## 2015-12-17 ENCOUNTER — Encounter: Payer: Self-pay | Admitting: Physical Therapy

## 2015-12-17 ENCOUNTER — Ambulatory Visit: Payer: Self-pay | Admitting: Physical Therapy

## 2015-12-17 DIAGNOSIS — R531 Weakness: Secondary | ICD-10-CM

## 2015-12-17 DIAGNOSIS — R262 Difficulty in walking, not elsewhere classified: Secondary | ICD-10-CM

## 2015-12-17 DIAGNOSIS — M25571 Pain in right ankle and joints of right foot: Secondary | ICD-10-CM

## 2015-12-17 NOTE — Therapy (Signed)
[place] MAIN [facility] SERVICES [address] [place], [place], [address] Phone: [phone]   Fax:  [phone]  Physical Therapy Treatment  Patient Details  Name: Danielle Strickland MRN: [id number] Date of Birth: Jan 19, 1966 Referring Provider: Wylene Simmer MD  Encounter Date: 12/17/2015      PT End of Session - 12/17/15 1154    Visit Number 7   Number of Visits 17   Date for PT Re-Evaluation 01/09/16   PT Start Time 1102   PT Stop Time 1150   PT Time Calculation (min) 48 min   Activity Tolerance Patient limited by pain   Behavior During Therapy Anxious      Past Medical History  Diagnosis Date  . HPV in female   . Abnormal Pap smear of vagina and vaginal HPV     ascus   . Migraines   . Microscopic hematuria   . Mental disorder     anxiety  . Endometriosis   . 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 GERD (gastroesophageal reflux disease)   . IBS (irritable bowel syndrome)   . Liver disease     H/O hepatitis  . Allergy   . Peptic ulcer disease   . History of migraine   . Skin cancer     left thigh    Past Surgical History  Procedure Laterality Date  . Laparoscopically assisted vaginal hysterectomy  2006  . Tubal ligation    . Wisdom tooth extraction    . Salivary gland surgery    . Abdominal hysterectomy    . Appendectomy    . Cholecystectomy N/A 11/26/2015    Procedure: LAPAROSCOPIC CHOLECYSTECTOMY ;  Surgeon: Jules Husbands, MD;  Location: [facility] ORS;  Service: General;  Laterality: N/A;    There were no vitals filed for this visit.  Visit Diagnosis:  Right ankle pain  Difficulty walking  Weakness      Subjective Assessment - 12/17/15 1108    Subjective Patient reports walking in the hallway at home still but hasn't been walking outside. She is fearful of falling and of her ankle giving out and therefore she doesn't go anywhere by herself.    Pertinent History Personal factors affecting rehab:  anxiety/depression, fear of injuring right ankle, current fracture in ankle, severe pain, decreased balance and high fall risk, decreased caregiver support at home;    How long can you sit comfortably? feels tingling/burning;    How long can you stand comfortably? 15 min   How long can you walk comfortably? never comfortable with walking; increased pain with all walking;    Diagnostic tests MRI in September of right ankle and was diagnosed with ankle fracture; her last x-ray shows that it is still broken but is healing from the outside in;    Currently in Pain? Yes   Pain Score 5    Pain Location Ankle   Pain Orientation Right   Pain Descriptors / Indicators Burning   Pain Type Chronic pain   Pain Onset More than a month ago      TREATMENT: Warm up on recumbent bike, level 0 x4 min (unbilled) with full revolution;  Resisted weighted gait 12.5# forward/backward/side/side x2 laps, 2 way with min A for balance and min VCs to increase weight shift;  Standing on airex: Heel/toe raises with both legs x15; Mini squat with 2 rail assist x10;  Forward/backward diagonal step outs with red tband around legs above knees  x2 laps, 10 feet each with min VCS to maintain knee flexion for better quad strengthening;  RLE stance on yellow dynadisc with LLE hip flexion/abduction x10 each with B rail assist with cues to maintain knee extension and increase ankle stability; Patient required min VCs for balance stability, including to increase trunk control for less loss of balance with smaller base of support and to improve ankle stability with standing on uneven surfaces;  Long sitting: Green tband ankle DF, IV/EV x10 each with mod VCs to isolate just ankle ROM for better strengthening. She has increased difficulty with ankle EV/IV due to pain and weakness;  Patient required min-moderate verbal/tactile cues for correct exercise technique with all exercise. She reports less stiffness after treatment  session;                           PT Education - 12/17/15 1153    Education provided Yes   Education Details ankle strengthening, stability exercise   Person(s) Educated Patient   Methods Explanation;Verbal cues   Comprehension Verbalized understanding;Returned demonstration;Verbal cues required             PT Long Term Goals - 11/14/15 1209    PT LONG TERM GOAL #1   Title Patient will be independent in home exercise program to improve strength/mobility for better functional independence with ADLs by 01/09/16   Time 8   Period Weeks   Status New   PT LONG TERM GOAL #2   Title Patient (< 61 years old) will complete five times sit to stand test in < 10 seconds indicating an increased LE strength and improved balance by 01/09/16   Time 8   Period Weeks   Status New   PT LONG TERM GOAL #3   Title Patient will increase 10 meter walk test to >1.92m/s as to improve gait speed for better community ambulation and to reduce fall risk. by 01/09/16   Time 8   Period Weeks   Status New   PT LONG TERM GOAL #4   Title Patient will ascend/descend 4 stairs with 1 rail assist independently forward reciprocally without loss of balance to improve ability to get in/out of home. by 01/09/16   Time 8   Period Weeks   Status New   PT LONG TERM GOAL #5   Title Patient will report a worst pain of 3/10 on VAS in  right ankle           to improve tolerance with ADLs and reduced symptoms with activities. by 01/09/16   Time 8   Period Weeks   Status New   Additional Long Term Goals   Additional Long Term Goals Yes   PT LONG TERM GOAL #6   Title Patient will increase lower extremity functional scale to >60/80 to demonstrate improved functional mobility and increased tolerance with ADLs. by 01/09/16   Time 8   Period Weeks   Status New   PT LONG TERM GOAL #7   Title Patient will increase RLE ankle AROM DF: >5 degrees, PF: >60 degrees, IV: >15 degrees, EV: >15 degrees for  increased functional ROM to improve foot clearance with gait tasks by 01/09/16   Time 4   Period Weeks   Status New   PT LONG TERM GOAL #8   Title Patient will increase BLE gross strength to 4+/5 as to improve functional strength for independent gait, increased standing tolerance and increased ADL ability. by 01/09/16  Time 8   Period Weeks   Status New               Plan - 12/17/15 1227    Clinical Impression Statement Instructed patient in BLE ankle strengthening and stability exercise. Patient continues to be limited by pain activities and requires mod VCS for better weight shift to improve ankle stability. Patient able to tolerate increased resistance with ankle strengthening. She does have increased pain with ankle IV with resisted tband exercise. Patient would benefit from additional skilled PT intervention to improve ankle strengthening and reduce pain with ADLs.    Pt will benefit from skilled therapeutic intervention in order to improve on the following deficits Abnormal gait;Decreased endurance;Impaired sensation;Decreased activity tolerance;Decreased strength;Pain;Decreased balance;Decreased mobility;Difficulty walking;Decreased range of motion;Decreased safety awareness;Impaired flexibility   Rehab Potential Fair   Clinical Impairments Affecting Rehab Potential positive: motivation to get better, negative: chronic condition, anxious, co-morbidities; Clinical presentation is unstable as her pain comes and goes with no pattern, severity of pain limits mobility, uncontrolled anxiety   PT Frequency 2x / week   PT Duration 8 weeks   PT Treatment/Interventions Cryotherapy;Moist Heat;Balance training;Therapeutic exercise;Therapeutic activities;Functional mobility training;Stair training;Gait training;ADLs/Self Care Home Management;Neuromuscular re-education;Patient/family education;Manual techniques;Taping;Energy conservation;Dry needling;Passive range of motion   PT Next Visit Plan  check goals, work on stability exercise   PT Home Exercise Plan advanced- increase tband exercise to 15 reps   Consulted and Agree with Plan of Care Patient        Problem List Patient Active Problem List   Diagnosis Date Noted  . Malleolar fracture 12/10/2015  . Complex regional pain syndrome of lower extremity 12/10/2015  . Hx MRSA infection 06/18/2015  . Abnormal chest x-ray 05/29/2015  . Family history of colon cancer 02/21/2013  . TMJ (dislocation of temporomandibular joint) 02/21/2013  . Tobacco abuse 02/21/2013  . Allergy   . Skin cancer   . Peptic ulcer disease   . Generalized anxiety disorder   . Herpes simplex without mention of complication   . IBS (irritable bowel syndrome)   . History of migraine     Zackariah Vanderpol PT, DPT 12/17/2015, 12:32 PM  [place] MAIN [facility] SERVICES [address] [place], [place], [address] Phone: [phone]   Fax:  [phone]  Name: Danielle Strickland MRN: [id number] Date of Birth: 29-Dec-1965

## 2015-12-20 ENCOUNTER — Encounter: Payer: Self-pay | Admitting: Physical Therapy

## 2015-12-20 ENCOUNTER — Ambulatory Visit: Payer: BLUE CROSS/BLUE SHIELD | Admitting: Physical Therapy

## 2015-12-20 DIAGNOSIS — R262 Difficulty in walking, not elsewhere classified: Secondary | ICD-10-CM

## 2015-12-20 DIAGNOSIS — R531 Weakness: Secondary | ICD-10-CM

## 2015-12-20 DIAGNOSIS — M25571 Pain in right ankle and joints of right foot: Secondary | ICD-10-CM

## 2015-12-20 NOTE — Therapy (Signed)
[place] MAIN [facility] SERVICES [address] [place], [place], [address] Phone: [phone]   Fax:  [phone]  Physical Therapy Treatment Progress Note 11/13/15 to 12/20/15  Patient Details  Name: Danielle Strickland MRN: [id number] Date of Birth: October 22, 1966 Referring Provider: Wylene Simmer MD  Encounter Date: 12/20/2015      PT End of Session - 12/20/15 1136    Visit Number 8   Number of Visits 17   Date for PT Re-Evaluation 01/09/16   PT Start Time 1045   PT Stop Time 1130   PT Time Calculation (min) 45 min   Activity Tolerance Patient limited by pain   Behavior During Therapy Anxious      Past Medical History  Diagnosis Date  . HPV in female   . Abnormal Pap smear of vagina and vaginal HPV     ascus   . Migraines   . Microscopic hematuria   . Mental disorder     anxiety  . Endometriosis   . 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 GERD (gastroesophageal reflux disease)   . IBS (irritable bowel syndrome)   . Liver disease     H/O hepatitis  . Allergy   . Peptic ulcer disease   . History of migraine   . Skin cancer     left thigh    Past Surgical History  Procedure Laterality Date  . Laparoscopically assisted vaginal hysterectomy  2006  . Tubal ligation    . Wisdom tooth extraction    . Salivary gland surgery    . Abdominal hysterectomy    . Appendectomy    . Cholecystectomy N/A 11/26/2015    Procedure: LAPAROSCOPIC CHOLECYSTECTOMY ;  Surgeon: Jules Husbands, MD;  Location: [facility] ORS;  Service: General;  Laterality: N/A;    There were no vitals filed for this visit.  Visit Diagnosis:  Right ankle pain  Difficulty walking  Weakness      Subjective Assessment - 12/20/15 1051    Subjective Patient reports being able to walk 1-2 blocks outside her apartment. She did have trouble crossing the street due to fear of cars. reports slight discomfort in right ankle with walking uphill.    Pertinent  History Personal factors affecting rehab: anxiety/depression, fear of injuring right ankle, current fracture in ankle, severe pain, decreased balance and high fall risk, decreased caregiver support at home;    How long can you sit comfortably? feels tingling/burning;    How long can you stand comfortably? 15 min   How long can you walk comfortably? never comfortable with walking; increased pain with all walking;    Diagnostic tests MRI in September of right ankle and was diagnosed with ankle fracture; her last x-ray shows that it is still broken but is healing from the outside in;    Currently in Pain? Yes   Pain Score 6    Pain Location Ankle   Pain Orientation Right   Pain Descriptors / Indicators Aching   Pain Type Chronic pain   Pain Onset More than a month ago            [facility] PT Assessment - 12/20/15 0001    Assessment   Medical Diagnosis --   Observation/Other Assessments   Lower Extremity Functional Scale  31/80 (the lower the score the greater the disability; improved from initial eval on 11/13/15 which was 16/80   AROM   Right Ankle Dorsiflexion -10  Right Ankle Plantar Flexion 70   Right Ankle Inversion 30   Right Ankle Eversion 12   Standardized Balance Assessment   Five times sit to stand comments  32 sec without HHA, without boot; high fall risk; improved from initial eval on 11/13/15 which was 39 sec with 2 HHA   10 Meter Walk 0.609 m/s without AD, without boot; improved from initial eval on 11/13/15 which was 0.5 m/s     TREATMENT  Warm up on recumbent bike, level 0 BLE x5 min (unbilled);  Ascend/descend 4 steps with B rail assist, x2 reps with forward non-reciprocal and forward reciprocal; very slow;  Patient does require min VCs to increase step length with descending stairs for less discomfort in ankle with less ankle DF;  PT assessed patient's progress towards goals, see above;  Long sitting: RLE ankle green tband: DF, IV/EV x12 each with min VCs to isolate  ankle ROM and to increase end range for better strengthening  PT performed gentle RLE ankle distraction 10 sec hold, 10 sec rest x3 min with slight reduction in ankle pain to 5/10;  Patient demonstrates less sensitivity to deep pressure to right ankle as compared to light touch;                        PT Education - 12/20/15 1136    Education provided Yes   Education Details ankle strengthening, progress towards goals;   Person(s) Educated Patient   Methods Explanation;Verbal cues   Comprehension Verbalized understanding;Returned demonstration;Verbal cues required             PT Long Term Goals - 12/20/15 1107    PT LONG TERM GOAL #1   Title Patient will be independent in home exercise program to improve strength/mobility for better functional independence with ADLs by 01/09/16   Time 8   Period Weeks   Status On-going   PT LONG TERM GOAL #2   Title Patient (< 68 years old) will complete five times sit to stand test in < 10 seconds indicating an increased LE strength and improved balance by 01/09/16   Time 8   Period Weeks   Status Partially Met   PT LONG TERM GOAL #3   Title Patient will increase 10 meter walk test to >1.93ms as to improve gait speed for better community ambulation and to reduce fall risk. by 01/09/16   Time 8   Period Weeks   Status Partially Met   PT LONG TERM GOAL #4   Title Patient will ascend/descend 4 stairs with 1 rail assist independently forward reciprocally without loss of balance to improve ability to get in/out of home. by 01/09/16   Time 8   Period Weeks   Status Partially Met   PT LONG TERM GOAL #5   Title Patient will report a worst pain of 3/10 on VAS in  right ankle           to improve tolerance with ADLs and reduced symptoms with activities. by 01/09/16   Time 8   Period Weeks   Status Not Met   PT LONG TERM GOAL #6   Title Patient will increase lower extremity functional scale to >60/80 to demonstrate improved  functional mobility and increased tolerance with ADLs. by 01/09/16   Time 8   Period Weeks   Status Partially Met   PT LONG TERM GOAL #7   Title Patient will increase RLE ankle AROM DF: >5 degrees, PF: >60  degrees, IV: >15 degrees, EV: >15 degrees for increased functional ROM to improve foot clearance with gait tasks by 01/09/16   Time 4   Period Weeks   Status Partially Met   PT LONG TERM GOAL #8   Title Patient will increase BLE gross strength to 4+/5 as to improve functional strength for independent gait, increased standing tolerance and increased ADL ability. by 01/09/16   Time 8   Period Weeks   Status Partially Met               Plan - 12/20/15 1138    Clinical Impression Statement PT assessed patient's progress towards goals. She demonstrates significant improvement in right ankle ROM as compared to initial eval. She also demonstrates improvement in sit<>stand transfer and gait speed. She is still considered a higher fall risk due to outcome measures but has made improvement. She is no longer wearing her boot and was able to walk 1-2 blocks outside her apartment this week. Patient would benefit from additional skilled PT intervention to improve LE strength, balance and gait safety;    Pt will benefit from skilled therapeutic intervention in order to improve on the following deficits Abnormal gait;Decreased endurance;Impaired sensation;Decreased activity tolerance;Decreased strength;Pain;Decreased balance;Decreased mobility;Difficulty walking;Decreased range of motion;Decreased safety awareness;Impaired flexibility   Rehab Potential Fair   Clinical Impairments Affecting Rehab Potential positive: motivation to get better, negative: chronic condition, anxious, co-morbidities; Clinical presentation is unstable as her pain comes and goes with no pattern, severity of pain limits mobility, uncontrolled anxiety   PT Frequency 2x / week   PT Duration 8 weeks   PT Treatment/Interventions  Cryotherapy;Moist Heat;Balance training;Therapeutic exercise;Therapeutic activities;Functional mobility training;Stair training;Gait training;ADLs/Self Care Home Management;Neuromuscular re-education;Patient/family education;Manual techniques;Taping;Energy conservation;Dry needling;Passive range of motion   PT Next Visit Plan work on ankle stability, balance, gait speed;   PT Home Exercise Plan continue as previously given   Consulted and Agree with Plan of Care Patient        Problem List Patient Active Problem List   Diagnosis Date Noted  . Malleolar fracture 12/10/2015  . Complex regional pain syndrome of lower extremity 12/10/2015  . Hx MRSA infection 06/18/2015  . Abnormal chest x-ray 05/29/2015  . Family history of colon cancer 02/21/2013  . TMJ (dislocation of temporomandibular joint) 02/21/2013  . Tobacco abuse 02/21/2013  . Allergy   . Skin cancer   . Peptic ulcer disease   . Generalized anxiety disorder   . Herpes simplex without mention of complication   . IBS (irritable bowel syndrome)   . History of migraine     Reo Portela PT, DPT 12/20/2015, 11:40 AM  [place] MAIN [facility] SERVICES [address] [place], [place], [address] Phone: [phone]   Fax:  [phone]  Name: Danielle Strickland MRN: [id number] Date of Birth: [birth year]

## 2015-12-24 ENCOUNTER — Encounter: Payer: Self-pay | Admitting: Physical Therapy

## 2015-12-24 ENCOUNTER — Ambulatory Visit: Payer: Self-pay | Admitting: Physical Therapy

## 2015-12-24 DIAGNOSIS — R531 Weakness: Secondary | ICD-10-CM

## 2015-12-24 DIAGNOSIS — M25571 Pain in right ankle and joints of right foot: Secondary | ICD-10-CM

## 2015-12-24 DIAGNOSIS — R262 Difficulty in walking, not elsewhere classified: Secondary | ICD-10-CM

## 2015-12-24 NOTE — Therapy (Signed)
[place] MAIN [facility] SERVICES [address] [place], [place], [address] Phone: [phone]   Fax:  [phone]  Physical Therapy Treatment  Patient Details  Name: Danielle Strickland MRN: [id number] Date of Birth: 1965-10-28 Referring Provider: Wylene Simmer MD  Encounter Date: 12/24/2015      PT End of Session - 12/24/15 1157    Visit Number 9   Number of Visits 25   Date for PT Re-Evaluation 01/31/16   PT Start Time 1120   PT Stop Time 1200   PT Time Calculation (min) 40 min   Activity Tolerance Patient limited by pain   Behavior During Therapy Anxious      Past Medical History  Diagnosis Date  . HPV in female   . Abnormal Pap smear of vagina and vaginal HPV     ascus   . Migraines   . Microscopic hematuria   . Mental disorder     anxiety  . Endometriosis   . 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 GERD (gastroesophageal reflux disease)   . IBS (irritable bowel syndrome)   . Liver disease     H/O hepatitis  . Allergy   . Peptic ulcer disease   . History of migraine   . Skin cancer     left thigh    Past Surgical History  Procedure Laterality Date  . Laparoscopically assisted vaginal hysterectomy  2006  . Tubal ligation    . Wisdom tooth extraction    . Salivary gland surgery    . Abdominal hysterectomy    . Appendectomy    . Cholecystectomy N/A 11/26/2015    Procedure: LAPAROSCOPIC CHOLECYSTECTOMY ;  Surgeon: Jules Husbands, MD;  Location: [facility] ORS;  Service: General;  Laterality: N/A;    There were no vitals filed for this visit.  Visit Diagnosis:  Right ankle pain  Difficulty walking  Weakness      Subjective Assessment - 12/24/15 1123    Subjective Patient reports having a good weekend. She reports being able to walk a little faster. She reports using her bed buddy to help with sleeping which has helped reduct ankle pain;   Pertinent History Personal factors affecting rehab:  anxiety/depression, fear of injuring right ankle, current fracture in ankle, severe pain, decreased balance and high fall risk, decreased caregiver support at home;    How long can you sit comfortably? feels tingling/burning;    How long can you stand comfortably? 15 min   How long can you walk comfortably? never comfortable with walking; increased pain with all walking;    Diagnostic tests MRI in September of right ankle and was diagnosed with ankle fracture; her last x-ray shows that it is still broken but is healing from the outside in;    Currently in Pain? Yes   Pain Score 4    Pain Location Ankle   Pain Orientation Right   Pain Descriptors / Indicators Aching;Sore   Pain Type Chronic pain   Pain Onset More than a month ago          TREATMENT  Warm up on recumbent bike, level 0 BLE x5 min (unbilled);   Resisted weighted gait 12.5# forward/backward/side/side x2 laps, 2 way with min A for balance and min VCs to increase weight shift; Forward lunge with BUE overhead reach looking at sky ([facility] forward lunge) x5 each foot in front with min A for balance and min VCs to improve  technique;  Standing RLE: BAPs IV/EV level 3 x10 reps;  Long sitting, green tband ankle DF, IV/EV x15 each with min VCs to avoid hip/knee ROM; Patient able to demonstrate better ankle control with less shaking during eccentric return;  Finished with moist heat and IFC (interferential) TENs to right ankle x10 min at tolerated intensity;    Patient reports less ankle pain after treatment session;                        PT Education - 12/24/15 1157    Education provided Yes   Education Details ankle strengthening,    Person(s) Educated Patient   Methods Explanation;Verbal cues   Comprehension Verbalized understanding;Returned demonstration;Verbal cues required             PT Long Term Goals - 12/20/15 1107    PT LONG TERM GOAL #1   Title Patient will be independent in  home exercise program to improve strength/mobility for better functional independence with ADLs by 01/09/16   Time 8   Period Weeks   Status On-going   PT LONG TERM GOAL #2   Title Patient (< 67 years old) will complete five times sit to stand test in < 10 seconds indicating an increased LE strength and improved balance by 01/09/16   Time 8   Period Weeks   Status Partially Met   PT LONG TERM GOAL #3   Title Patient will increase 10 meter walk test to >1.20ms as to improve gait speed for better community ambulation and to reduce fall risk. by 01/09/16   Time 8   Period Weeks   Status Partially Met   PT LONG TERM GOAL #4   Title Patient will ascend/descend 4 stairs with 1 rail assist independently forward reciprocally without loss of balance to improve ability to get in/out of home. by 01/09/16   Time 8   Period Weeks   Status Partially Met   PT LONG TERM GOAL #5   Title Patient will report a worst pain of 3/10 on VAS in  right ankle           to improve tolerance with ADLs and reduced symptoms with activities. by 01/09/16   Time 8   Period Weeks   Status Not Met   PT LONG TERM GOAL #6   Title Patient will increase lower extremity functional scale to >60/80 to demonstrate improved functional mobility and increased tolerance with ADLs. by 01/09/16   Time 8   Period Weeks   Status Partially Met   PT LONG TERM GOAL #7   Title Patient will increase RLE ankle AROM DF: >5 degrees, PF: >60 degrees, IV: >15 degrees, EV: >15 degrees for increased functional ROM to improve foot clearance with gait tasks by 01/09/16   Time 4   Period Weeks   Status Partially Met   PT LONG TERM GOAL #8   Title Patient will increase BLE gross strength to 4+/5 as to improve functional strength for independent gait, increased standing tolerance and increased ADL ability. by 01/09/16   Time 8   Period Weeks   Status Partially Met               Plan - 12/24/15 1157    Clinical Impression Statement  Instructed patient in ankle strengthening exercise. She was able to tolerate advanced exercise with forward lunges unsupported. Patient also able to demonstrate better eccentric return with ankle tband exercise. Finished with TENs concurrent with  moist heat to right ankle. Patient reports less pain after treatment session;    Pt will benefit from skilled therapeutic intervention in order to improve on the following deficits Abnormal gait;Decreased endurance;Impaired sensation;Decreased activity tolerance;Decreased strength;Pain;Decreased balance;Decreased mobility;Difficulty walking;Decreased range of motion;Decreased safety awareness;Impaired flexibility   Rehab Potential Fair   Clinical Impairments Affecting Rehab Potential positive: motivation to get better, negative: chronic condition, anxious, co-morbidities; Clinical presentation is unstable as her pain comes and goes with no pattern, severity of pain limits mobility, uncontrolled anxiety   PT Frequency 2x / week   PT Duration 6 weeks   PT Treatment/Interventions Cryotherapy;Moist Heat;Balance training;Therapeutic exercise;Therapeutic activities;Functional mobility training;Stair training;Gait training;ADLs/Self Care Home Management;Neuromuscular re-education;Patient/family education;Manual techniques;Taping;Energy conservation;Dry needling;Passive range of motion;Electrical Stimulation;Ultrasound   PT Next Visit Plan work on ankle stability, balance, gait speed;   PT Home Exercise Plan continue as previously given   Consulted and Agree with Plan of Care Patient        Problem List Patient Active Problem List   Diagnosis Date Noted  . Malleolar fracture 12/10/2015  . Complex regional pain syndrome of lower extremity 12/10/2015  . Hx MRSA infection 06/18/2015  . Abnormal chest x-ray 05/29/2015  . Family history of colon cancer 02/21/2013  . TMJ (dislocation of temporomandibular joint) 02/21/2013  . Tobacco abuse 02/21/2013  . Allergy    . Skin cancer   . Peptic ulcer disease   . Generalized anxiety disorder   . Herpes simplex without mention of complication   . IBS (irritable bowel syndrome)   . History of migraine     Trotter,Margaret PT, DPT 12/24/2015, 11:59 AM  [place] MAIN [facility] SERVICES [address] [place], [place], [address] Phone: [phone]   Fax:  [phone]  Name: SIMREN POPSON MRN: [id number] Date of Birth: 12-Aug-1966

## 2015-12-24 NOTE — Addendum Note (Signed)
Addended by: Blanche East E on: 12/24/2015 11:00 AM   Modules accepted: Orders

## 2015-12-27 ENCOUNTER — Encounter: Payer: Self-pay | Admitting: Physical Therapy

## 2015-12-27 ENCOUNTER — Ambulatory Visit: Payer: Self-pay | Attending: Orthopedic Surgery | Admitting: Physical Therapy

## 2015-12-27 DIAGNOSIS — R531 Weakness: Secondary | ICD-10-CM | POA: Insufficient documentation

## 2015-12-27 DIAGNOSIS — R262 Difficulty in walking, not elsewhere classified: Secondary | ICD-10-CM | POA: Insufficient documentation

## 2015-12-27 DIAGNOSIS — M25571 Pain in right ankle and joints of right foot: Secondary | ICD-10-CM | POA: Insufficient documentation

## 2015-12-27 NOTE — Therapy (Signed)
[place] MAIN [facility] SERVICES [address] [place], [place], [address] Phone: [phone]   Fax:  [phone]  Physical Therapy Treatment  Patient Details  Name: BRIDIE COLQUHOUN MRN: [id number] Date of Birth: [birth year] Referring Provider: Wylene Simmer MD  Encounter Date: 12/27/2015      PT End of Session - 12/27/15 1135    Visit Number 10   Number of Visits 25   Date for PT Re-Evaluation 01/31/16   PT Start Time 1055   PT Stop Time 1145   PT Time Calculation (min) 50 min   Equipment Utilized During Treatment Gait belt   Activity Tolerance Patient limited by pain   Behavior During Therapy Anxious      Past Medical History  Diagnosis Date  . HPV in female   . Abnormal Pap smear of vagina and vaginal HPV     ascus   . Migraines   . Microscopic hematuria   . Mental disorder     anxiety  . Endometriosis   . 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 GERD (gastroesophageal reflux disease)   . IBS (irritable bowel syndrome)   . Liver disease     H/O hepatitis  . Allergy   . Peptic ulcer disease   . History of migraine   . Skin cancer     left thigh    Past Surgical History  Procedure Laterality Date  . Laparoscopically assisted vaginal hysterectomy  2006  . Tubal ligation    . Wisdom tooth extraction    . Salivary gland surgery    . Abdominal hysterectomy    . Appendectomy    . Cholecystectomy N/A 11/26/2015    Procedure: LAPAROSCOPIC CHOLECYSTECTOMY ;  Surgeon: Jules Husbands, MD;  Location: [facility] ORS;  Service: General;  Laterality: N/A;    There were no vitals filed for this visit.  Visit Diagnosis:  Right ankle pain  Difficulty walking  Weakness      Subjective Assessment - 12/27/15 1059    Subjective Patient reports getting restorator and has been using it at least 30 min each day; She reports some soreness but not too bad; She reports getting out in rain on Wednesday and reports that she  mis-judged her step and almost fell. She reports stepping down hard on right foot which was uncomfortable;    Pertinent History Personal factors affecting rehab: anxiety/depression, fear of injuring right ankle, current fracture in ankle, severe pain, decreased balance and high fall risk, decreased caregiver support at home;    How long can you sit comfortably? feels tingling/burning;    How long can you stand comfortably? 15 min   How long can you walk comfortably? never comfortable with walking; increased pain with all walking;    Diagnostic tests MRI in September of right ankle and was diagnosed with ankle fracture; her last x-ray shows that it is still broken but is healing from the outside in;    Currently in Pain? Yes   Pain Score 6    Pain Location Ankle   Pain Orientation Right   Pain Descriptors / Indicators Aching;Sore   Pain Type Chronic pain   Pain Onset More than a month ago     TREATMENT:  Warm up on Treadmill 1.0 mph with 2 HHA x4 min with min VCs to increase step length and to increase DF at heel strike; Demonstrates good foot clearance with gait;  Forward lunges on BOSU x10  bilaterally with 2-1 rail assist and min Vcs to increase knee flexion in BLE; Step up on BOSU with opposite LE toe taps on 8 inch box x5 each;  Standing on BOSU: -heel/toe raises x15 with bilateral rail assist; -mini squat x10 with B rail assist;  Leg press: RLE only 30# with dyna disc x15; RLE heel raises 30# x10 Patient required min Vcs for correct positioning and to reduce ankle IV/EV with advanced ankle motion;   Long sitting RLE: ankle IV/EV green tband x12 each;   For all exercise patient required min-moderate verbal/tactile cues for correct exercise technique. She was able to demonstrate better ankle control with eccentric return with tband exercise.    Finished with moist heat and IFC (interferential) TENs to right ankle x15 min at tolerated intensity;    Patient reports less ankle  pain after treatment session;                                 PT Education - 12/27/15 1134    Education provided Yes   Education Details ankle strengthening   Person(s) Educated Patient   Methods Explanation;Verbal cues   Comprehension Verbalized understanding;Returned demonstration;Verbal cues required             PT Long Term Goals - 12/20/15 1107    PT LONG TERM GOAL #1   Title Patient will be independent in home exercise program to improve strength/mobility for better functional independence with ADLs by 01/09/16   Time 8   Period Weeks   Status On-going   PT LONG TERM GOAL #2   Title Patient (< 28 years old) will complete five times sit to stand test in < 10 seconds indicating an increased LE strength and improved balance by 01/09/16   Time 8   Period Weeks   Status Partially Met   PT LONG TERM GOAL #3   Title Patient will increase 10 meter walk test to >1.81ms as to improve gait speed for better community ambulation and to reduce fall risk. by 01/09/16   Time 8   Period Weeks   Status Partially Met   PT LONG TERM GOAL #4   Title Patient will ascend/descend 4 stairs with 1 rail assist independently forward reciprocally without loss of balance to improve ability to get in/out of home. by 01/09/16   Time 8   Period Weeks   Status Partially Met   PT LONG TERM GOAL #5   Title Patient will report a worst pain of 3/10 on VAS in  right ankle           to improve tolerance with ADLs and reduced symptoms with activities. by 01/09/16   Time 8   Period Weeks   Status Not Met   PT LONG TERM GOAL #6   Title Patient will increase lower extremity functional scale to >60/80 to demonstrate improved functional mobility and increased tolerance with ADLs. by 01/09/16   Time 8   Period Weeks   Status Partially Met   PT LONG TERM GOAL #7   Title Patient will increase RLE ankle AROM DF: >5 degrees, PF: >60 degrees, IV: >15 degrees, EV: >15 degrees for  increased functional ROM to improve foot clearance with gait tasks by 01/09/16   Time 4   Period Weeks   Status Partially Met   PT LONG TERM GOAL #8   Title Patient will increase BLE gross strength to 4+/5 as  to improve functional strength for independent gait, increased standing tolerance and increased ADL ability. by 01/09/16   Time 8   Period Weeks   Status Partially Met               Plan - 12/27/15 1136    Clinical Impression Statement Instructed patient in BLE ankle strengthening (R>L); Patient is hesitant with single leg RLE movement due to increased pain; She is able to demonstrate better motor control with tband exercise. Patient responds well to IFC (interferential) TENs  with less ankle pain; She would benefit from additional skilled PT intervention to improve balance/gait safety and reduce ankle pain;    Pt will benefit from skilled therapeutic intervention in order to improve on the following deficits Abnormal gait;Decreased endurance;Impaired sensation;Decreased activity tolerance;Decreased strength;Pain;Decreased balance;Decreased mobility;Difficulty walking;Decreased range of motion;Decreased safety awareness;Impaired flexibility   Rehab Potential Fair   Clinical Impairments Affecting Rehab Potential positive: motivation to get better, negative: chronic condition, anxious, co-morbidities; Clinical presentation is unstable as her pain comes and goes with no pattern, severity of pain limits mobility, uncontrolled anxiety   PT Frequency 2x / week   PT Duration 6 weeks   PT Treatment/Interventions Cryotherapy;Moist Heat;Balance training;Therapeutic exercise;Therapeutic activities;Functional mobility training;Stair training;Gait training;ADLs/Self Care Home Management;Neuromuscular re-education;Patient/family education;Manual techniques;Taping;Energy conservation;Dry needling;Passive range of motion;Electrical Stimulation;Ultrasound   PT Next Visit Plan work on ankle stability,  balance, gait speed;   PT Home Exercise Plan continue as previously given   Consulted and Agree with Plan of Care Patient        Problem List Patient Active Problem List   Diagnosis Date Noted  . Malleolar fracture 12/10/2015  . Complex regional pain syndrome of lower extremity 12/10/2015  . Hx MRSA infection 06/18/2015  . Abnormal chest x-ray 05/29/2015  . Family history of colon cancer 02/21/2013  . TMJ (dislocation of temporomandibular joint) 02/21/2013  . Tobacco abuse 02/21/2013  . Allergy   . Skin cancer   . Peptic ulcer disease   . Generalized anxiety disorder   . Herpes simplex without mention of complication   . IBS (irritable bowel syndrome)   . History of migraine     Leiya Keesey PT, DPT 12/27/2015, 11:38 AM  [place] MAIN [facility] SERVICES [address] [place], [place], [address] Phone: [phone]   Fax:  [phone]  Name: LAVEAH GLOSTER MRN: [id number] Date of Birth: [birth year]

## 2015-12-30 ENCOUNTER — Telehealth: Payer: Self-pay

## 2015-12-30 ENCOUNTER — Other Ambulatory Visit: Payer: Self-pay | Admitting: Family Medicine

## 2015-12-30 NOTE — Telephone Encounter (Signed)
[employer] notified letter is ready to be picked up at the front desk.

## 2015-12-30 NOTE — Telephone Encounter (Signed)
done

## 2015-12-30 NOTE — Telephone Encounter (Signed)
Pt needs note for loan company that pt is still being treated, pt is still receiving PT, and pt is not able to work now due to [place]. Pt would like to pick up note on 12/31/15. Pt request cb.

## 2015-12-31 ENCOUNTER — Encounter: Payer: Self-pay | Admitting: Physical Therapy

## 2015-12-31 ENCOUNTER — Ambulatory Visit: Payer: Self-pay | Admitting: Physical Therapy

## 2015-12-31 DIAGNOSIS — R262 Difficulty in walking, not elsewhere classified: Secondary | ICD-10-CM

## 2015-12-31 DIAGNOSIS — R531 Weakness: Secondary | ICD-10-CM

## 2015-12-31 DIAGNOSIS — M25571 Pain in right ankle and joints of right foot: Secondary | ICD-10-CM

## 2015-12-31 NOTE — Therapy (Signed)
[place] MAIN [facility] SERVICES [address] [place], [place], [address] Phone: [phone]   Fax:  [phone]  Physical Therapy Treatment  Patient Details  Name: Danielle Strickland MRN: [id number] Date of Birth: 02-16-1966 Referring Provider: Wylene Simmer MD  Encounter Date: 12/31/2015      PT End of Session - 12/31/15 1609    Visit Number 11   Number of Visits 25   Date for PT Re-Evaluation 01/31/16   PT Start Time 1115   PT Stop Time 1158   PT Time Calculation (min) 43 min   Equipment Utilized During Treatment Gait belt   Activity Tolerance Patient limited by pain   Behavior During Therapy Anxious;WFL for tasks assessed/performed      Past Medical History  Diagnosis Date  . HPV in female   . Abnormal Pap smear of vagina and vaginal HPV     ascus   . Migraines   . Microscopic hematuria   . Mental disorder     anxiety  . Endometriosis   . 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 GERD (gastroesophageal reflux disease)   . IBS (irritable bowel syndrome)   . Liver disease     H/O hepatitis  . Allergy   . Peptic ulcer disease   . History of migraine   . Skin cancer     left thigh    Past Surgical History  Procedure Laterality Date  . Laparoscopically assisted vaginal hysterectomy  2006  . Tubal ligation    . Wisdom tooth extraction    . Salivary gland surgery    . Abdominal hysterectomy    . Appendectomy    . Cholecystectomy N/A 11/26/2015    Procedure: LAPAROSCOPIC CHOLECYSTECTOMY ;  Surgeon: Jules Husbands, MD;  Location: [facility] ORS;  Service: General;  Laterality: N/A;    There were no vitals filed for this visit.  Visit Diagnosis:  Right ankle pain  Difficulty walking  Weakness      Subjective Assessment - 12/31/15 1126    Subjective Patient reports having most of her ankle pain in the morning and in the evening; She reports doing okay today; She reports having episode of right ankle wanting to  give out in last week; Denies any new falls;    Pertinent History Personal factors affecting rehab: anxiety/depression, fear of injuring right ankle, current fracture in ankle, severe pain, decreased balance and high fall risk, decreased caregiver support at home;    How long can you sit comfortably? feels tingling/burning;    How long can you stand comfortably? 15 min   How long can you walk comfortably? never comfortable with walking; increased pain with all walking;    Diagnostic tests MRI in September of right ankle and was diagnosed with ankle fracture; her last x-ray shows that it is still broken but is healing from the outside in;    Currently in Pain? Yes   Pain Score 5    Pain Location Ankle   Pain Orientation Right   Pain Descriptors / Indicators Aching;Sore   Pain Type Chronic pain   Pain Onset More than a month ago        TREATMENT:  Warm up on recumbent bike BLE x5 min (Unbilled);  Leg press: RLE only dyna disc 30# 2x15; RLE only heel raises 30# x15; Patient required min VCs to slow down LE movement for better strengthening and min VCs for correct positioning for better strengthening;  Standing with green tband: LLE hip abduction/extension x10 each with increased weight bearing in RLE; Band above knees, diagonal step forward/backward 15 feet x1 laps each;  Resisted weighted gait, 12.5# forward/backward, side stepping 2 way x3 laps each; Required CGA for balance control and mod VCs to increase step length for improved strengthening;   SLS on star, toe taps, SLS leg kick x1 each; Patient required min A for balance with star step outs/SLS kicks for better balance control; she reports increased ankle discomfort with SLS on RLE being able to tolerate only for 5-10 sec;   Patient required min-moderate verbal/tactile cues for correct exercise technique with all exercise.                            PT Education - 12/31/15 1609    Education provided  Yes   Education Details ankle strengthening/stability;    Person(s) Educated Patient   Methods Explanation;Verbal cues   Comprehension Verbalized understanding;Returned demonstration;Verbal cues required             PT Long Term Goals - 12/20/15 1107    PT LONG TERM GOAL #1   Title Patient will be independent in home exercise program to improve strength/mobility for better functional independence with ADLs by 01/09/16   Time 8   Period Weeks   Status On-going   PT LONG TERM GOAL #2   Title Patient (< 3 years old) will complete five times sit to stand test in < 10 seconds indicating an increased LE strength and improved balance by 01/09/16   Time 8   Period Weeks   Status Partially Met   PT LONG TERM GOAL #3   Title Patient will increase 10 meter walk test to >1.46ms as to improve gait speed for better community ambulation and to reduce fall risk. by 01/09/16   Time 8   Period Weeks   Status Partially Met   PT LONG TERM GOAL #4   Title Patient will ascend/descend 4 stairs with 1 rail assist independently forward reciprocally without loss of balance to improve ability to get in/out of home. by 01/09/16   Time 8   Period Weeks   Status Partially Met   PT LONG TERM GOAL #5   Title Patient will report a worst pain of 3/10 on VAS in  right ankle           to improve tolerance with ADLs and reduced symptoms with activities. by 01/09/16   Time 8   Period Weeks   Status Not Met   PT LONG TERM GOAL #6   Title Patient will increase lower extremity functional scale to >60/80 to demonstrate improved functional mobility and increased tolerance with ADLs. by 01/09/16   Time 8   Period Weeks   Status Partially Met   PT LONG TERM GOAL #7   Title Patient will increase RLE ankle AROM DF: >5 degrees, PF: >60 degrees, IV: >15 degrees, EV: >15 degrees for increased functional ROM to improve foot clearance with gait tasks by 01/09/16   Time 4   Period Weeks   Status Partially Met   PT LONG  TERM GOAL #8   Title Patient will increase BLE gross strength to 4+/5 as to improve functional strength for independent gait, increased standing tolerance and increased ADL ability. by 01/09/16   Time 8   Period Weeks   Status Partially Met  Plan - 12/31/15 1609    Clinical Impression Statement Instructed patient in advanced RLE ankle strengthening and stability exercise. Advanced exercise with SLS task on RLE to improve stance control; Patient had increased right ankle pain with prolonged weight bearing being able to only tolerate SLS for approximately 5-10 sec with discomfort. Patient would benefit from additional skilled PT intervention to improve balance/gait safety, improve ankle flexibility and strength;    Pt will benefit from skilled therapeutic intervention in order to improve on the following deficits Abnormal gait;Decreased endurance;Impaired sensation;Decreased activity tolerance;Decreased strength;Pain;Decreased balance;Decreased mobility;Difficulty walking;Decreased range of motion;Decreased safety awareness;Impaired flexibility   Rehab Potential Fair   Clinical Impairments Affecting Rehab Potential positive: motivation to get better, negative: chronic condition, anxious, co-morbidities; Clinical presentation is unstable as her pain comes and goes with no pattern, severity of pain limits mobility, uncontrolled anxiety   PT Frequency 2x / week   PT Duration 6 weeks   PT Treatment/Interventions Cryotherapy;Moist Heat;Balance training;Therapeutic exercise;Therapeutic activities;Functional mobility training;Stair training;Gait training;ADLs/Self Care Home Management;Neuromuscular re-education;Patient/family education;Manual techniques;Taping;Energy conservation;Dry needling;Passive range of motion;Electrical Stimulation;Ultrasound   PT Next Visit Plan work on ankle stability, balance, gait speed;   PT Home Exercise Plan continue as previously given   Consulted and  Agree with Plan of Care Patient        Problem List Patient Active Problem List   Diagnosis Date Noted  . Malleolar fracture 12/10/2015  . Complex regional pain syndrome of lower extremity 12/10/2015  . Hx MRSA infection 06/18/2015  . Abnormal chest x-ray 05/29/2015  . Family history of colon cancer 02/21/2013  . TMJ (dislocation of temporomandibular joint) 02/21/2013  . Tobacco abuse 02/21/2013  . Allergy   . Skin cancer   . Peptic ulcer disease   . Generalized anxiety disorder   . Herpes simplex without mention of complication   . IBS (irritable bowel syndrome)   . History of migraine     Tayva Easterday PT, DPT 12/31/2015, 4:11 PM  [place] MAIN [facility] SERVICES [address] [place], [place], [address] Phone: [phone]   Fax:  [phone]  Name: Danielle Strickland MRN: [id number] Date of Birth: 1966/02/07

## 2016-01-03 ENCOUNTER — Encounter: Payer: Self-pay | Admitting: Physical Therapy

## 2016-01-03 ENCOUNTER — Ambulatory Visit: Payer: Self-pay

## 2016-01-03 DIAGNOSIS — R262 Difficulty in walking, not elsewhere classified: Secondary | ICD-10-CM

## 2016-01-03 DIAGNOSIS — M25571 Pain in right ankle and joints of right foot: Secondary | ICD-10-CM

## 2016-01-03 DIAGNOSIS — R531 Weakness: Secondary | ICD-10-CM

## 2016-01-03 NOTE — Therapy (Signed)
[place] MAIN [facility] SERVICES [address] [place], [place], [address] Phone: [phone]   Fax:  [phone]  Physical Therapy Treatment  Patient Details  Name: MACIE BAUM MRN: [id number] Date of Birth: January 30, 1966 Referring Provider: Wylene Simmer MD  Encounter Date: 01/03/2016      PT End of Session - 01/03/16 1107    Visit Number 12   Number of Visits 25   Date for PT Re-Evaluation 01/31/16   Authorization Type g code   PT Start Time 1100   PT Stop Time 1140   PT Time Calculation (min) 40 min   Equipment Utilized During Treatment Gait belt   Activity Tolerance Patient limited by pain   Behavior During Therapy Anxious;WFL for tasks assessed/performed      Past Medical History  Diagnosis Date  . HPV in female   . Abnormal Pap smear of vagina and vaginal HPV     ascus   . Migraines   . Microscopic hematuria   . Mental disorder     anxiety  . Endometriosis   . 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 GERD (gastroesophageal reflux disease)   . IBS (irritable bowel syndrome)   . Liver disease     H/O hepatitis  . Allergy   . Peptic ulcer disease   . History of migraine   . Skin cancer     left thigh    Past Surgical History  Procedure Laterality Date  . Laparoscopically assisted vaginal hysterectomy  2006  . Tubal ligation    . Wisdom tooth extraction    . Salivary gland surgery    . Abdominal hysterectomy    . Appendectomy    . Cholecystectomy N/A 11/26/2015    Procedure: LAPAROSCOPIC CHOLECYSTECTOMY ;  Surgeon: Jules Husbands, MD;  Location: [facility] ORS;  Service: General;  Laterality: N/A;    There were no vitals filed for this visit.  Visit Diagnosis:  Right ankle pain  Difficulty walking  Weakness      Subjective Assessment - 01/03/16 1103    Subjective Pt states that she is doing well on this date. She arrived late due to difficulty with her transportation. Pt complains of R ankle  pain upon arrival. Denies any falls since the last visit. She is performing HEP and states she has purchased a floor bike to use at home. No specific questions or concerns   Pertinent History Personal factors affecting rehab: anxiety/depression, fear of injuring right ankle, current fracture in ankle, severe pain, decreased balance and high fall risk, decreased caregiver support at home;    How long can you sit comfortably? feels tingling/burning;    How long can you stand comfortably? 15 min   How long can you walk comfortably? never comfortable with walking; increased pain with all walking;    Diagnostic tests MRI in September of right ankle and was diagnosed with ankle fracture; her last x-ray shows that it is still broken but is healing from the outside in;    Currently in Pain? Yes   Pain Score 4    Pain Location Ankle   Pain Orientation Right   Pain Descriptors / Indicators Aching;Sore   Pain Type Chronic pain   Pain Onset More than a month ago   Pain Frequency Constant   Multiple Pain Sites No      TREATMENT:  Warm up on recumbent bike BLE x 5 min during history (3 min unbilled);  Leg press: RLE only dyna disc 30# 2 x15; RLE only heel raises 30# 2 x 15;  Standing with green tband: Hip abduction/extension x 10 each bilateral with increased weight bearing in RLE during SLS;  Standing rocker board lateral weight shifts to progress WB on RLE 1 minute x 2; Rocker board balance 1 minutes x 2; Resisted weighted gait, 12.5# forward/backward, side stepping 2 way x3 laps each; Required CGA for balance control; Patient required min-moderate verbal/tactile cues for correct exercise technique with all exercise. Education about desensitization exercises pt can perform at home. MHP x 5 minutes following therapy (unbilled);                              PT Education - 01/03/16 1105    Education provided Yes   Education Details HEP reinforced, desensitization    Person(s) Educated Patient   Methods Explanation   Comprehension Verbalized understanding             PT Long Term Goals - 12/20/15 1107    PT LONG TERM GOAL #1   Title Patient will be independent in home exercise program to improve strength/mobility for better functional independence with ADLs by 01/09/16   Time 8   Period Weeks   Status On-going   PT LONG TERM GOAL #2   Title Patient (< 46 years old) will complete five times sit to stand test in < 10 seconds indicating an increased LE strength and improved balance by 01/09/16   Time 8   Period Weeks   Status Partially Met   PT LONG TERM GOAL #3   Title Patient will increase 10 meter walk test to >1.35ms as to improve gait speed for better community ambulation and to reduce fall risk. by 01/09/16   Time 8   Period Weeks   Status Partially Met   PT LONG TERM GOAL #4   Title Patient will ascend/descend 4 stairs with 1 rail assist independently forward reciprocally without loss of balance to improve ability to get in/out of home. by 01/09/16   Time 8   Period Weeks   Status Partially Met   PT LONG TERM GOAL #5   Title Patient will report a worst pain of 3/10 on VAS in  right ankle           to improve tolerance with ADLs and reduced symptoms with activities. by 01/09/16   Time 8   Period Weeks   Status Not Met   PT LONG TERM GOAL #6   Title Patient will increase lower extremity functional scale to >60/80 to demonstrate improved functional mobility and increased tolerance with ADLs. by 01/09/16   Time 8   Period Weeks   Status Partially Met   PT LONG TERM GOAL #7   Title Patient will increase RLE ankle AROM DF: >5 degrees, PF: >60 degrees, IV: >15 degrees, EV: >15 degrees for increased functional ROM to improve foot clearance with gait tasks by 01/09/16   Time 4   Period Weeks   Status Partially Met   PT LONG TERM GOAL #8   Title Patient will increase BLE gross strength to 4+/5 as to improve functional strength for  independent gait, increased standing tolerance and increased ADL ability. by 01/09/16   Time 8   Period Weeks   Status Partially Met               Plan - 01/03/16 1121  Clinical Impression Statement Pt with increased sensivity to all movements in R ankle. Therapist worked with patient on strengthening and stability exercises as well as exercises to improve WB through RLE. Encouraged pt to continue HEP and follow-up as scheduled. Discussed desensitization with varying surfaces/fabrics with patient. Pt will continue to benefit from skilled PT services to address deficits in strength, balance, and desensitization of R ankle/floot.    Pt will benefit from skilled therapeutic intervention in order to improve on the following deficits Abnormal gait;Decreased endurance;Impaired sensation;Decreased activity tolerance;Decreased strength;Pain;Decreased balance;Decreased mobility;Difficulty walking;Decreased range of motion;Decreased safety awareness;Impaired flexibility   Rehab Potential Fair   Clinical Impairments Affecting Rehab Potential positive: motivation to get better, negative: chronic condition, anxious, co-morbidities; Clinical presentation is unstable as her pain comes and goes with no pattern, severity of pain limits mobility, uncontrolled anxiety   PT Frequency 2x / week   PT Duration 6 weeks   PT Treatment/Interventions Cryotherapy;Moist Heat;Balance training;Therapeutic exercise;Therapeutic activities;Functional mobility training;Stair training;Gait training;ADLs/Self Care Home Management;Neuromuscular re-education;Patient/family education;Manual techniques;Taping;Energy conservation;Dry needling;Passive range of motion;Electrical Stimulation;Ultrasound   PT Next Visit Plan work on ankle stability, balance, gait speed; Consider desensitization of RLE with varying fabrics/textures   PT Home Exercise Plan continue as previously given   Consulted and Agree with Plan of Care Patient         Problem List Patient Active Problem List   Diagnosis Date Noted  . Malleolar fracture 12/10/2015  . Complex regional pain syndrome of lower extremity 12/10/2015  . Hx MRSA infection 06/18/2015  . Abnormal chest x-ray 05/29/2015  . Family history of colon cancer 02/21/2013  . TMJ (dislocation of temporomandibular joint) 02/21/2013  . Tobacco abuse 02/21/2013  . Allergy   . Skin cancer   . Peptic ulcer disease   . Generalized anxiety disorder   . Herpes simplex without mention of complication   . IBS (irritable bowel syndrome)   . History of migraine    Phillips Grout PT, DPT   Luisalberto Beegle 01/03/2016, 12:05 PM  [place] MAIN [facility] SERVICES [address] [place], [place], [address] Phone: [phone]   Fax:  [phone]  Name: ROSEBUD KOENEN MRN: [id number] Date of Birth: 07/27/1966

## 2016-01-06 ENCOUNTER — Ambulatory Visit: Payer: Self-pay | Admitting: Family Medicine

## 2016-01-06 ENCOUNTER — Telehealth: Payer: Self-pay | Admitting: Gastroenterology

## 2016-01-06 NOTE — Telephone Encounter (Signed)
Spoke with pt and advised her I will continue to sample the medication until I speak with our [employer] rep to see if there is a copay for cash paying customers. Samples taken to [place].

## 2016-01-06 NOTE — Telephone Encounter (Signed)
This is a patient of Dr Allen Norris. She is prescribed Linzess and has some refills left, but no longer has insurance. She does have a drug card that brings the medication down from $400+ to $299, but that is still too much. Are we able to help her with some samples for a month or so? Also, is there another affordable medication we might be able to prescribe her? Please call and advise.   (FYI we have 5 boxes here in the [place] office)

## 2016-01-08 ENCOUNTER — Ambulatory Visit (INDEPENDENT_AMBULATORY_CARE_PROVIDER_SITE_OTHER): Payer: Self-pay | Admitting: Family Medicine

## 2016-01-08 ENCOUNTER — Encounter: Payer: Self-pay | Admitting: Family Medicine

## 2016-01-08 ENCOUNTER — Ambulatory Visit: Payer: Self-pay | Admitting: Physical Therapy

## 2016-01-08 ENCOUNTER — Encounter: Payer: Self-pay | Admitting: Physical Therapy

## 2016-01-08 VITALS — BP 116/74 | HR 106 | Temp 98.2°F | Ht 64.0 in | Wt 168.2 lb

## 2016-01-08 DIAGNOSIS — R531 Weakness: Secondary | ICD-10-CM

## 2016-01-08 DIAGNOSIS — G90521 Complex regional pain syndrome I of right lower limb: Secondary | ICD-10-CM

## 2016-01-08 DIAGNOSIS — F329 Major depressive disorder, single episode, unspecified: Secondary | ICD-10-CM

## 2016-01-08 DIAGNOSIS — M25571 Pain in right ankle and joints of right foot: Secondary | ICD-10-CM

## 2016-01-08 DIAGNOSIS — S82891S Other fracture of right lower leg, sequela: Secondary | ICD-10-CM

## 2016-01-08 DIAGNOSIS — F32A Depression, unspecified: Secondary | ICD-10-CM

## 2016-01-08 DIAGNOSIS — R262 Difficulty in walking, not elsewhere classified: Secondary | ICD-10-CM

## 2016-01-08 MED ORDER — GABAPENTIN 300 MG PO CAPS: 300.0000 mg | ORAL_CAPSULE | Freq: Every day | ORAL | Status: DC

## 2016-01-08 MED ORDER — NORTRIPTYLINE HCL 25 MG PO CAPS: 25.0000 mg | ORAL_CAPSULE | Freq: Every day | ORAL | Status: DC

## 2016-01-08 MED ORDER — FLUOXETINE HCL 20 MG PO CAPS: 20.0000 mg | ORAL_CAPSULE | Freq: Every day | ORAL | Status: DC

## 2016-01-08 NOTE — Progress Notes (Signed)
Pre visit review using our clinic review tool, if applicable. No additional management support is needed unless otherwise documented below in the visit note. 

## 2016-01-08 NOTE — Therapy (Signed)
[place] MAIN [facility] SERVICES [address] [place], [place], [address] Phone: [phone]   Fax:  [phone]  Physical Therapy Treatment  Patient Details  Name: Danielle Strickland MRN: [id number] Date of Birth: 15-Feb-1966 Referring Provider: Wylene Simmer MD  Encounter Date: 01/08/2016      PT End of Session - 01/08/16 1247    Visit Number 13   Number of Visits 25   Date for PT Re-Evaluation 01/31/16   PT Start Time 3536   PT Stop Time 1110   PT Time Calculation (min) 55 min   Equipment Utilized During Treatment Gait belt   Activity Tolerance Patient limited by pain   Behavior During Therapy Anxious;WFL for tasks assessed/performed      Past Medical History  Diagnosis Date  . HPV in female   . Abnormal Pap smear of vagina and vaginal HPV     ascus   . Migraines   . Microscopic hematuria   . Mental disorder     anxiety  . Endometriosis   . 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 GERD (gastroesophageal reflux disease)   . IBS (irritable bowel syndrome)   . Liver disease     H/O hepatitis  . Allergy   . Peptic ulcer disease   . History of migraine   . Skin cancer     left thigh    Past Surgical History  Procedure Laterality Date  . Laparoscopically assisted vaginal hysterectomy  2006  . Tubal ligation    . Wisdom tooth extraction    . Salivary gland surgery    . Abdominal hysterectomy    . Appendectomy    . Cholecystectomy N/A 11/26/2015    Procedure: LAPAROSCOPIC CHOLECYSTECTOMY ;  Surgeon: Jules Husbands, MD;  Location: [facility] ORS;  Service: General;  Laterality: N/A;    There were no vitals filed for this visit.  Visit Diagnosis:  Right ankle pain  Difficulty walking  Weakness      Subjective Assessment - 01/08/16 1018    Subjective Patient reports having to go to pain specialist next week. She reports that her appointment and spinal injections are really expensive and she is getting anxious  about being in a tough financial situations;    Pertinent History Personal factors affecting rehab: anxiety/depression, fear of injuring right ankle, current fracture in ankle, severe pain, decreased balance and high fall risk, decreased caregiver support at home;    How long can you sit comfortably? feels tingling/burning;    How long can you stand comfortably? 15 min   How long can you walk comfortably? never comfortable with walking; increased pain with all walking;    Diagnostic tests MRI in September of right ankle and was diagnosed with ankle fracture; her last x-ray shows that it is still broken but is healing from the outside in;    Currently in Pain? Yes   Pain Score 3    Pain Location Ankle   Pain Orientation Right   Pain Descriptors / Indicators Aching;Sore   Pain Type Chronic pain   Pain Onset More than a month ago      TREATMENT: Warm up on recumbent bike, BLE only level 1 x4 min (2 min forward/2 min backward), unbilled;  Resisted weighted gait, stepping over 6 inch step with 7.5#, forward/backward x5 reps, side step up/down x5 reps; Patient required min A for balance and mod VCs to increase step length;   Standing on  airex: Squat down partial sit x10 reps; required mod VCs to increase weight shift back for better squat position;  Feet apart, standing with balloon tap x30 sec x2 reps;  Self/manual propelling treadmill x10 steps x2 reps with min VCs to increase step length for better propulsion;   Leg press, RLE only, with dyna disc 45# 2x10 Leg press, RLE only heel raise with dyna disc 45# 2x10 Patient requires min VCs to slow down LE movement and increase ROM for better strengthening;  SLS on airex balance pad 10 sec each LE; Patient continues to have increased instability with increased ankle strategies and knee strategies for better balance control;  Finished with interferential TENs at tolerated intensity, x15 min concurrent with moist heat to right ankle in long  sitting; patient reports less pain after treatment session;                            PT Education - 01/08/16 1247    Education provided Yes   Education Details LE strengthening/balance   Person(s) Educated Patient   Methods Explanation;Verbal cues   Comprehension Verbalized understanding;Returned demonstration;Verbal cues required             PT Long Term Goals - 12/20/15 1107    PT LONG TERM GOAL #1   Title Patient will be independent in home exercise program to improve strength/mobility for better functional independence with ADLs by 01/09/16   Time 8   Period Weeks   Status On-going   PT LONG TERM GOAL #2   Title Patient (< 52 years old) will complete five times sit to stand test in < 10 seconds indicating an increased LE strength and improved balance by 01/09/16   Time 8   Period Weeks   Status Partially Met   PT LONG TERM GOAL #3   Title Patient will increase 10 meter walk test to >1.31ms as to improve gait speed for better community ambulation and to reduce fall risk. by 01/09/16   Time 8   Period Weeks   Status Partially Met   PT LONG TERM GOAL #4   Title Patient will ascend/descend 4 stairs with 1 rail assist independently forward reciprocally without loss of balance to improve ability to get in/out of home. by 01/09/16   Time 8   Period Weeks   Status Partially Met   PT LONG TERM GOAL #5   Title Patient will report a worst pain of 3/10 on VAS in  right ankle           to improve tolerance with ADLs and reduced symptoms with activities. by 01/09/16   Time 8   Period Weeks   Status Not Met   PT LONG TERM GOAL #6   Title Patient will increase lower extremity functional scale to >60/80 to demonstrate improved functional mobility and increased tolerance with ADLs. by 01/09/16   Time 8   Period Weeks   Status Partially Met   PT LONG TERM GOAL #7   Title Patient will increase RLE ankle AROM DF: >5 degrees, PF: >60 degrees, IV: >15 degrees,  EV: >15 degrees for increased functional ROM to improve foot clearance with gait tasks by 01/09/16   Time 4   Period Weeks   Status Partially Met   PT LONG TERM GOAL #8   Title Patient will increase BLE gross strength to 4+/5 as to improve functional strength for independent gait, increased standing tolerance and increased ADL  ability. by 01/09/16   Time 8   Period Weeks   Status Partially Met               Plan - 01/08/16 1248    Clinical Impression Statement Patient instructed in advanced ankle strengthening and balance exercise, working on increasing weight shift. Patient able to advance exercise with less rail assist and being able to tolerate increased weight bearing through RLE; Patient reports increased burning in RLE ankle. Finished with interferential TENs concurrent with moist heat x15 min at tolerated intensity; Patient responds well with less pain at end of treatment session; She would benefit from additional skilled PT intervention to improve ankle strength and balance for return to PLOF.    Pt will benefit from skilled therapeutic intervention in order to improve on the following deficits Abnormal gait;Decreased endurance;Impaired sensation;Decreased activity tolerance;Decreased strength;Pain;Decreased balance;Decreased mobility;Difficulty walking;Decreased range of motion;Decreased safety awareness;Impaired flexibility   Rehab Potential Fair   Clinical Impairments Affecting Rehab Potential positive: motivation to get better, negative: chronic condition, anxious, co-morbidities; Clinical presentation is unstable as her pain comes and goes with no pattern, severity of pain limits mobility, uncontrolled anxiety   PT Frequency 2x / week   PT Duration 6 weeks   PT Treatment/Interventions Cryotherapy;Moist Heat;Balance training;Therapeutic exercise;Therapeutic activities;Functional mobility training;Stair training;Gait training;ADLs/Self Care Home Management;Neuromuscular  re-education;Patient/family education;Manual techniques;Taping;Energy conservation;Dry needling;Passive range of motion;Electrical Stimulation;Ultrasound   PT Next Visit Plan work on ankle stability, balance, gait speed; Consider desensitization of RLE with varying fabrics/textures   PT Home Exercise Plan continue as previously given   Consulted and Agree with Plan of Care Patient        Problem List Patient Active Problem List   Diagnosis Date Noted  . Malleolar fracture 12/10/2015  . Complex regional pain syndrome of lower extremity 12/10/2015  . Hx MRSA infection 06/18/2015  . Abnormal chest x-ray 05/29/2015  . Family history of colon cancer 02/21/2013  . TMJ (dislocation of temporomandibular joint) 02/21/2013  . Tobacco abuse 02/21/2013  . Allergy   . Skin cancer   . Peptic ulcer disease   . Generalized anxiety disorder   . Herpes simplex without mention of complication   . IBS (irritable bowel syndrome)   . History of migraine     Kenita Bines PT, DPT 01/08/2016, 12:53 PM  [place] MAIN [facility] SERVICES [address] [place], [place], [address] Phone: [phone]   Fax:  [phone]  Name: ANNDREA MIHELICH MRN: [id number] Date of Birth: 06/06/1966
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
## 2016-01-10 ENCOUNTER — Ambulatory Visit: Payer: Self-pay

## 2016-01-10 DIAGNOSIS — R262 Difficulty in walking, not elsewhere classified: Secondary | ICD-10-CM

## 2016-01-10 DIAGNOSIS — M25571 Pain in right ankle and joints of right foot: Secondary | ICD-10-CM

## 2016-01-10 NOTE — Therapy (Signed)
[place] MAIN [facility] SERVICES [address] [place], [place], [address] Phone: [phone]   Fax:  [phone]  Physical Therapy Treatment  Patient Details  Name: Danielle Strickland MRN: [id number] Date of Birth: [birth year] Referring Provider: Wylene Simmer MD  Encounter Date: 01/10/2016      PT End of Session - 01/10/16 1047    Visit Number 14   Number of Visits 25   Date for PT Re-Evaluation 01/31/16   Authorization Type g code   PT Start Time 1020   PT Stop Time 1100   PT Time Calculation (min) 40 min   Equipment Utilized During Treatment Gait belt   Activity Tolerance Patient limited by pain   Behavior During Therapy Anxious;WFL for tasks assessed/performed      Past Medical History  Diagnosis Date  . HPV in female   . Abnormal Pap smear of vagina and vaginal HPV     ascus   . Migraines   . Microscopic hematuria   . Mental disorder     anxiety  . Endometriosis   . 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 GERD (gastroesophageal reflux disease)   . IBS (irritable bowel syndrome)   . Liver disease     H/O hepatitis  . Allergy   . Peptic ulcer disease   . History of migraine   . Skin cancer     left thigh    Past Surgical History  Procedure Laterality Date  . Laparoscopically assisted vaginal hysterectomy  2006  . Tubal ligation    . Wisdom tooth extraction    . Salivary gland surgery    . Abdominal hysterectomy    . Appendectomy    . Cholecystectomy N/A 11/26/2015    Procedure: LAPAROSCOPIC CHOLECYSTECTOMY ;  Surgeon: Jules Husbands, MD;  Location: [facility] ORS;  Service: General;  Laterality: N/A;    There were no vitals filed for this visit.  Visit Diagnosis:  Right ankle pain  Difficulty walking      Subjective Assessment - 01/10/16 1044    Subjective Pt states that she was having a lot of pain in R foot after last therapy session. Pain has decreased since that time and is baseline today. Pain  has been worse with cold weather recently. No specific questions or concerns at this time.    Pertinent History Personal factors affecting rehab: anxiety/depression, fear of injuring right ankle, current fracture in ankle, severe pain, decreased balance and high fall risk, decreased caregiver support at home;    How long can you sit comfortably? feels tingling/burning;    How long can you stand comfortably? 15 min   How long can you walk comfortably? never comfortable with walking; increased pain with all walking;    Diagnostic tests MRI in September of right ankle and was diagnosed with ankle fracture; her last x-ray shows that it is still broken but is healing from the outside in;    Currently in Pain? Yes   Pain Score 4    Pain Location Ankle   Pain Orientation Right   Pain Descriptors / Indicators Aching   Pain Type Chronic pain   Pain Onset More than a month ago   Multiple Pain Sites No        TREATMENT:  THER-ACTIVITY Performed mirror therapy with patient; Performed AROM DF, PF, Inversion, and Eversion of LLE with patient watching reflection of RLE in mirror. Pt performed toe flexion/extension as well as  circumduction of ankle. Red Tband L ankle for L ankle inversion and eversion while pt watches reflection of RLE in mirror. Densensitization with mirror using tissue and then cotton swab with light strokes over L foot while patient watches reflection in mirror. Pt reports increase in R foot pain during technique. Can only tolerate short bouts due to increased pain/irritation, patient provided written handout with information regarding CRPS.  THER-EX Warm up on recumbent bike, BLE only level 1 x4 min (2 min forward/2 min backward) during history; Leg press, RLE only, with dyna disc 45# 2 x 15 Leg press, RLE only heel raise with dyna disc 45# 2 x 15; 1/2 foam roll AP heel/toe rocking without UE support; 1/2 foam roll alternating tandem balance without UE support; Standing on airex  squat down partial sit 2 x10 reps; required mod VCs to increase weight shift back for better squat position;   Pt requires verbal cues for proper form/technique throughout exercise.;  Finished with moist heat to right ankle in long sitting x 8 minutes (unbilled). During heat pt watched video about chronic pain on computer followed by discussion with therapist about chronic pain and CRPS;                         PT Education - 01/10/16 1046    Education provided Yes   Education Details LE strengthening/balance, CRPS, chronic pain   Person(s) Educated Patient   Methods Explanation;Verbal cues;Handout  video   Comprehension Verbalized understanding;Returned demonstration;Verbal cues required             PT Long Term Goals - 12/20/15 1107    PT LONG TERM GOAL #1   Title Patient will be independent in home exercise program to improve strength/mobility for better functional independence with ADLs by 01/09/16   Time 8   Period Weeks   Status On-going   PT LONG TERM GOAL #2   Title Patient (< 41 years old) will complete five times sit to stand test in < 10 seconds indicating an increased LE strength and improved balance by 01/09/16   Time 8   Period Weeks   Status Partially Met   PT LONG TERM GOAL #3   Title Patient will increase 10 meter walk test to >1.75ms as to improve gait speed for better community ambulation and to reduce fall risk. by 01/09/16   Time 8   Period Weeks   Status Partially Met   PT LONG TERM GOAL #4   Title Patient will ascend/descend 4 stairs with 1 rail assist independently forward reciprocally without loss of balance to improve ability to get in/out of home. by 01/09/16   Time 8   Period Weeks   Status Partially Met   PT LONG TERM GOAL #5   Title Patient will report a worst pain of 3/10 on VAS in  right ankle           to improve tolerance with ADLs and reduced symptoms with activities. by 01/09/16   Time 8   Period Weeks   Status Not  Met   PT LONG TERM GOAL #6   Title Patient will increase lower extremity functional scale to >60/80 to demonstrate improved functional mobility and increased tolerance with ADLs. by 01/09/16   Time 8   Period Weeks   Status Partially Met   PT LONG TERM GOAL #7   Title Patient will increase RLE ankle AROM DF: >5 degrees, PF: >60 degrees, IV: >15 degrees,  EV: >15 degrees for increased functional ROM to improve foot clearance with gait tasks by 01/09/16   Time 4   Period Weeks   Status Partially Met   PT LONG TERM GOAL #8   Title Patient will increase BLE gross strength to 4+/5 as to improve functional strength for independent gait, increased standing tolerance and increased ADL ability. by 01/09/16   Time 8   Period Weeks   Status Partially Met               Plan - 01/10/16 1047    Clinical Impression Statement Pt reports increase in RLE with superficial light touch to LLE during mirror therapy. She can only tolerate short bouts. Provided extensive education to patient regarding chronic pain as well as CRPS. Pt reports minimal increase in pain with exercises on this date. Pt encouraged to continue HEP and follow-up as scheduled.    Pt will benefit from skilled therapeutic intervention in order to improve on the following deficits Abnormal gait;Decreased endurance;Impaired sensation;Decreased activity tolerance;Decreased strength;Pain;Decreased balance;Decreased mobility;Difficulty walking;Decreased range of motion;Decreased safety awareness;Impaired flexibility   Rehab Potential Fair   Clinical Impairments Affecting Rehab Potential positive: motivation to get better, negative: chronic condition, anxious, co-morbidities; Clinical presentation is unstable as her pain comes and goes with no pattern, severity of pain limits mobility, uncontrolled anxiety   PT Frequency 2x / week   PT Duration 6 weeks   PT Treatment/Interventions Cryotherapy;Moist Heat;Balance training;Therapeutic  exercise;Therapeutic activities;Functional mobility training;Stair training;Gait training;ADLs/Self Care Home Management;Neuromuscular re-education;Patient/family education;Manual techniques;Taping;Energy conservation;Dry needling;Passive range of motion;Electrical Stimulation;Ultrasound   PT Next Visit Plan work on ankle stability, balance, gait speed; Consider desensitization of RLE and/or mirror therapy with varying fabrics/textures   PT Home Exercise Plan continue as previously given   Consulted and Agree with Plan of Care Patient        Problem List Patient Active Problem List   Diagnosis Date Noted  . Malleolar fracture 12/10/2015  . Complex regional pain syndrome of lower extremity 12/10/2015  . Hx MRSA infection 06/18/2015  . Abnormal chest x-ray 05/29/2015  . Family history of colon cancer 02/21/2013  . TMJ (dislocation of temporomandibular joint) 02/21/2013  . Tobacco abuse 02/21/2013  . Allergy   . Skin cancer   . Peptic ulcer disease   . Generalized anxiety disorder   . Herpes simplex without mention of complication   . IBS (irritable bowel syndrome)   . History of migraine    Phillips Grout PT, DPT   Huprich,Jason 01/10/2016, 11:29 AM  [place] MAIN [facility] SERVICES [address] [place], [place], [address] Phone: [phone]   Fax:  [phone]  Name: Danielle Strickland MRN: [id number] Date of Birth: 1966-06-25

## 2016-01-14 ENCOUNTER — Ambulatory Visit: Payer: Self-pay | Admitting: Physical Therapy

## 2016-01-14 ENCOUNTER — Encounter: Payer: Self-pay | Admitting: Physical Therapy

## 2016-01-14 DIAGNOSIS — R531 Weakness: Secondary | ICD-10-CM

## 2016-01-14 DIAGNOSIS — M25571 Pain in right ankle and joints of right foot: Secondary | ICD-10-CM

## 2016-01-14 DIAGNOSIS — R262 Difficulty in walking, not elsewhere classified: Secondary | ICD-10-CM

## 2016-01-14 NOTE — Therapy (Signed)
[place] MAIN [facility] SERVICES [address] [place], [place], [address] Phone: [phone]   Fax:  [phone]  Physical Therapy Treatment  Patient Details  Name: Danielle Strickland MRN: [id number] Date of Birth: 10-12-1966 Referring Provider: Wylene Simmer MD  Encounter Date: 01/14/2016      PT End of Session - 01/14/16 1155    Visit Number 15   Number of Visits 25   Date for PT Re-Evaluation 01/31/16   PT Start Time 1032   PT Stop Time 1128   PT Time Calculation (min) 56 min   Activity Tolerance Patient limited by pain   Behavior During Therapy Anxious;WFL for tasks assessed/performed      Past Medical History  Diagnosis Date  . HPV in female   . Abnormal Pap smear of vagina and vaginal HPV     ascus   . Migraines   . Microscopic hematuria   . Mental disorder     anxiety  . Endometriosis   . 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 GERD (gastroesophageal reflux disease)   . IBS (irritable bowel syndrome)   . Liver disease     H/O hepatitis  . Allergy   . Peptic ulcer disease   . History of migraine   . Skin cancer     left thigh    Past Surgical History  Procedure Laterality Date  . Laparoscopically assisted vaginal hysterectomy  2006  . Tubal ligation    . Wisdom tooth extraction    . Salivary gland surgery    . Abdominal hysterectomy    . Appendectomy    . Cholecystectomy N/A 11/26/2015    Procedure: LAPAROSCOPIC CHOLECYSTECTOMY ;  Surgeon: Jules Husbands, MD;  Location: [facility] ORS;  Service: General;  Laterality: N/A;    There were no vitals filed for this visit.  Visit Diagnosis:  Right ankle pain  Difficulty walking  Weakness      Subjective Assessment - 01/14/16 1040    Subjective Patient reports still having deep ache at night and in the morning. She reports that she will try walking outside with her niece. she is still having difficulty walking on uneven surfaces and has not been able  to walk around her neighborhood/[place]town.    Pertinent History Personal factors affecting rehab: anxiety/depression, fear of injuring right ankle, current fracture in ankle, severe pain, decreased balance and high fall risk, decreased caregiver support at home;    How long can you sit comfortably? feels tingling/burning;    How long can you stand comfortably? 15 min   How long can you walk comfortably? never comfortable with walking; increased pain with all walking;    Diagnostic tests MRI in September of right ankle and was diagnosed with ankle fracture; her last x-ray shows that it is still broken but is healing from the outside in;    Currently in Pain? Yes   Pain Score 4    Pain Location Ankle   Pain Orientation Right   Pain Descriptors / Indicators Burning;Aching   Pain Type Chronic pain   Pain Onset More than a month ago            [facility] PT Assessment - 01/14/16 0001    AROM   Right Ankle Dorsiflexion -5 (improved from 12/20/15 which was -10 degrees)   Right Ankle Plantar Flexion 70   Right Ankle Inversion 30   Right Ankle Eversion 30 (improved from 12/20/15 which was  12 degrees)       TREATMENT: Warm up on recumbent bike, BLE only level 1 x4 min (2 min forward/2 min backward), unbilled;  Standing on trampoline: Heel/toe raises x15; Mini jump/bounce x2 min; Squat with BUE  Ball toss on wall and catch x15 reps; Alternate march x10 bilaterally; Patient required close supervision and min VCs for correct sequencing especially with squat ball toss; She was able to perform all exercise without rail assist;   Ascend/descend 18 steps with 1 rail assist, forward reciprocal and non-reciprocal with min VCs to increase step length;  Self/manual propelling treadmill x15 steps with min VCs to increase step length for better propulsion;   Leg press, RLE only, with dyna disc 45# x15 Leg press, RLE only heel raise without dyna disc 45# 15 with mod VCs to increase ankle DF at rest for  better ankle ROM;  Patient requires min VCs to slow down LE movement and increase ROM for better strengthening;  PT assessed RLE ankle AROM, see attached; Patient reports increased discomfort in right ankle with DF along anterior foot/ankle; PT performed grade II AP, PA mobs of talocrual joint 10 sec bouts x2  Reps each without significant increase in pain and less anterior foot pain; Patient did report increased burning along right medial ankle but less pain along anterior foot;  Patient would benefit from additional skilled PT intervention to improve LE strength/ROM;                            PT Education - 01/14/16 1155    Education provided Yes   Education Details LE strengthening/stability exercise   Person(s) Educated Patient   Methods Explanation;Verbal cues   Comprehension Verbalized understanding;Returned demonstration;Verbal cues required             PT Long Term Goals - 12/20/15 1107    PT LONG TERM GOAL #1   Title Patient will be independent in home exercise program to improve strength/mobility for better functional independence with ADLs by 01/09/16   Time 8   Period Weeks   Status On-going   PT LONG TERM GOAL #2   Title Patient (< 80 years old) will complete five times sit to stand test in < 10 seconds indicating an increased LE strength and improved balance by 01/09/16   Time 8   Period Weeks   Status Partially Met   PT LONG TERM GOAL #3   Title Patient will increase 10 meter walk test to >1.64ms as to improve gait speed for better community ambulation and to reduce fall risk. by 01/09/16   Time 8   Period Weeks   Status Partially Met   PT LONG TERM GOAL #4   Title Patient will ascend/descend 4 stairs with 1 rail assist independently forward reciprocally without loss of balance to improve ability to get in/out of home. by 01/09/16   Time 8   Period Weeks   Status Partially Met   PT LONG TERM GOAL #5   Title Patient will report a worst  pain of 3/10 on VAS in  right ankle           to improve tolerance with ADLs and reduced symptoms with activities. by 01/09/16   Time 8   Period Weeks   Status Not Met   PT LONG TERM GOAL #6   Title Patient will increase lower extremity functional scale to >60/80 to demonstrate improved functional mobility and increased tolerance with  ADLs. by 01/09/16   Time 8   Period Weeks   Status Partially Met   PT LONG TERM GOAL #7   Title Patient will increase RLE ankle AROM DF: >5 degrees, PF: >60 degrees, IV: >15 degrees, EV: >15 degrees for increased functional ROM to improve foot clearance with gait tasks by 01/09/16   Time 4   Period Weeks   Status Partially Met   PT LONG TERM GOAL #8   Title Patient will increase BLE gross strength to 4+/5 as to improve functional strength for independent gait, increased standing tolerance and increased ADL ability. by 01/09/16   Time 8   Period Weeks   Status Partially Met               Plan - 01/14/16 1155    Clinical Impression Statement Advanced LE exercise with patient performed increased stance tasks on uneven surfaces for better ankle strengthening/stability; Patient continues to have ankle pain with advanced exercise. She is able to negotiate 18 steps with 1 rail assist, forward reciprocal and non-reciprocal having increased right ankle discomfort. She would benefit from additional skilled PT Intervention to improve RLE ankle ROM/strength and return to PLOF.    Pt will benefit from skilled therapeutic intervention in order to improve on the following deficits Abnormal gait;Decreased endurance;Impaired sensation;Decreased activity tolerance;Decreased strength;Pain;Decreased balance;Decreased mobility;Difficulty walking;Decreased range of motion;Decreased safety awareness;Impaired flexibility   Rehab Potential Fair   Clinical Impairments Affecting Rehab Potential positive: motivation to get better, negative: chronic condition, anxious,  co-morbidities; Clinical presentation is unstable as her pain comes and goes with no pattern, severity of pain limits mobility, uncontrolled anxiety   PT Frequency 2x / week   PT Duration 6 weeks   PT Treatment/Interventions Cryotherapy;Moist Heat;Balance training;Therapeutic exercise;Therapeutic activities;Functional mobility training;Stair training;Gait training;ADLs/Self Care Home Management;Neuromuscular re-education;Patient/family education;Manual techniques;Taping;Energy conservation;Dry needling;Passive range of motion;Electrical Stimulation;Ultrasound   PT Next Visit Plan Assess GOALS!   PT Home Exercise Plan continue as previously given   Consulted and Agree with Plan of Care Patient        Problem List Patient Active Problem List   Diagnosis Date Noted  . Malleolar fracture 12/10/2015  . Complex regional pain syndrome of lower extremity 12/10/2015  . Hx MRSA infection 06/18/2015  . Abnormal chest x-ray 05/29/2015  . Family history of colon cancer 02/21/2013  . TMJ (dislocation of temporomandibular joint) 02/21/2013  . Tobacco abuse 02/21/2013  . Allergy   . Skin cancer   . Peptic ulcer disease   . Generalized anxiety disorder   . Herpes simplex without mention of complication   . IBS (irritable bowel syndrome)   . History of migraine     Ellory Khurana  PT, DPT  01/14/2016, 11:57 AM  [place] MAIN [facility] SERVICES [address] [place], [place], [address] Phone: [phone]   Fax:  [phone]  Name: LAYLIANA DEVINS MRN: [id number] Date of Birth: April 03, 1966

## 2016-01-16 ENCOUNTER — Encounter: Payer: Self-pay | Admitting: Physical Therapy

## 2016-01-16 ENCOUNTER — Ambulatory Visit: Payer: Self-pay | Admitting: Physical Therapy

## 2016-01-16 DIAGNOSIS — M25571 Pain in right ankle and joints of right foot: Secondary | ICD-10-CM

## 2016-01-16 DIAGNOSIS — R262 Difficulty in walking, not elsewhere classified: Secondary | ICD-10-CM

## 2016-01-16 DIAGNOSIS — R531 Weakness: Secondary | ICD-10-CM

## 2016-01-16 NOTE — Therapy (Signed)
[place] MAIN [facility] SERVICES [address] [place], [place], [address] Phone: [phone]   Fax:  [phone]  Physical Therapy Treatment/Progress Note  Patient Details  Name: Danielle Strickland MRN: [id number] Date of Birth: 05-25-1966 Referring Provider: Wylene Simmer MD  Encounter Date: 01/16/2016      PT End of Session - 01/16/16 1757    Visit Number 16   Number of Visits 29   Date for PT Re-Evaluation 02/13/16   PT Start Time 1030   PT Stop Time 1125   PT Time Calculation (min) 55 min   Activity Tolerance Patient limited by pain   Behavior During Therapy [facility] for tasks assessed/performed;Anxious      Past Medical History  Diagnosis Date  . HPV in female   . Abnormal Pap smear of vagina and vaginal HPV     ascus   . Migraines   . Microscopic hematuria   . Mental disorder     anxiety  . Endometriosis   . 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 GERD (gastroesophageal reflux disease)   . IBS (irritable bowel syndrome)   . Liver disease     H/O hepatitis  . Allergy   . Peptic ulcer disease   . History of migraine   . Skin cancer     left thigh    Past Surgical History  Procedure Laterality Date  . Laparoscopically assisted vaginal hysterectomy  2006  . Tubal ligation    . Wisdom tooth extraction    . Salivary gland surgery    . Abdominal hysterectomy    . Appendectomy    . Cholecystectomy N/A 11/26/2015    Procedure: LAPAROSCOPIC CHOLECYSTECTOMY ;  Surgeon: Jules Husbands, MD;  Location: [facility] ORS;  Service: General;  Laterality: N/A;    There were no vitals filed for this visit.  Visit Diagnosis:  Right ankle pain  Difficulty walking  Weakness      Subjective Assessment - 01/16/16 1037    Subjective Patient reports increased right ankle pain today; Patient reports walking with her niece to the park and to a nearby [place]. Patient reports increased soreness in ankle feeling difficulty getting  back to her apartment; She went to see pain management doctor; She was diagnosed with neuropathic pain;    Pertinent History Personal factors affecting rehab: anxiety/depression, fear of injuring right ankle, current fracture in ankle, severe pain, decreased balance and high fall risk, decreased caregiver support at home;    How long can you sit comfortably? feels tingling/burning;    How long can you stand comfortably? 15 min   How long can you walk comfortably? never comfortable with walking; increased pain with all walking;    Diagnostic tests MRI in September of right ankle and was diagnosed with ankle fracture; her last x-ray shows that it is still broken but is healing from the outside in;    Currently in Pain? Yes   Pain Score 6    Pain Location Ankle   Pain Orientation Right   Pain Descriptors / Indicators Burning;Aching   Pain Type Chronic pain   Pain Onset More than a month ago            [facility] PT Assessment - 01/16/16 0001    Observation/Other Assessments   Lower Extremity Functional Scale  26/80 (the lower the score the greater the disability, more impaired since last reassessment on 12/20/15 which was 31/80)   AROM  Right Ankle Dorsiflexion -5   Right Ankle Plantar Flexion 70   Right Ankle Inversion 30   Right Ankle Eversion 30   PROM   Right Ankle Dorsiflexion 10   Strength   Right Ankle Dorsiflexion 3-/5   Right Ankle Plantar Flexion 3-/5   Right Ankle Inversion 3/5   Right Ankle Eversion 3-/5   Left Ankle Dorsiflexion 4+/5   Left Ankle Plantar Flexion 4+/5   Left Ankle Inversion 4+/5   Left Ankle Eversion 4+/5   Palpation   Palpation comment Patient continues to have severe tenderness to palpation along right anterior/medial ankle;    Ambulation/Gait   Gait Comments able to ambulate independently on firm surface without AD, demonstrating slower gait speed; with increased pain along right ankle with prolonged ambulation >10 minutes;   Standardized Balance  Assessment   Five times sit to stand comments  22 sec without HHA ; increased risk for falls; improved from last reassessment on 12/20/15 which was 32 sec without HHA;   10 Meter Walk 0.83 m/s without AD; home ambulator, lower fall risk; improved from last reassessment on 12/20/15 which was 0.6 m/s; limited community ambulator;     TREATMENT: Warm up on recumbent bike, BLE only level 0 x4 min (2 min forward/backward each, unbilled)  Instructed patient in 10 meter walk, 5 times sit<>Stand and other outcome measures. Please see above. Patient demonstrates improvement in gait speed and transfer ability; She also exhibits improvement in ankle strength. She continues to have pain and be limited in ankle DF.  Standing: Heel off step calf stretch 15 sec hold x2 bilaterally; Long sitting hamstring stretch 15 sec hold x2 bilaterally  Patient required min VCs to improve positioning to increase tissue extensibility;  Finished with Interferential TENs to right ankle at tolerated intensity x15 min concurrent with moist heat; Patient reports less ankle pain to 4/10 after treatment session; She reports less burning and pain with TENs for approximately 2-3 hours after treatment.                        PT Education - 01/16/16 1757    Education provided Yes   Education Details LE strengthening, findings towards goals;    Person(s) Educated Patient   Methods Explanation;Verbal cues   Comprehension Verbalized understanding;Returned demonstration;Verbal cues required             PT Long Term Goals - 01/16/16 1042    PT LONG TERM GOAL #1   Title Patient will be independent in home exercise program to improve strength/mobility for better functional independence with ADLs by 02/13/16   Time 8   Period Weeks   Status On-going   PT LONG TERM GOAL #2   Title Patient (< 37 years old) will complete five times sit to stand test in < 10 seconds indicating an increased LE strength and  improved balance by 02/13/16   Time 8   Period Weeks   Status Partially Met   PT LONG TERM GOAL #3   Title Patient will increase 10 meter walk test to >1.63ms as to improve gait speed for better community ambulation and to reduce fall risk. by 02/13/16   Time 8   Period Weeks   Status Partially Met   PT LONG TERM GOAL #4   Title Patient will ascend/descend 4 stairs with 1 rail assist independently forward reciprocally without loss of balance to improve ability to get in/out of home. by 02/13/16   Time 8  Period Weeks   Status Achieved   PT LONG TERM GOAL #5   Title Patient will report a worst pain of 3/10 on VAS in  right ankle           to improve tolerance with ADLs and reduced symptoms with activities. by 02/13/16   Time 8   Period Weeks   Status Not Met   PT LONG TERM GOAL #6   Title Patient will increase lower extremity functional scale to >60/80 to demonstrate improved functional mobility and increased tolerance with ADLs. by 02/13/16   Time 8   Period Weeks   Status Partially Met   PT LONG TERM GOAL #7   Title Patient will increase RLE ankle AROM DF: >5 degrees, PF: >60 degrees, IV: >15 degrees, EV: >15 degrees for increased functional ROM to improve foot clearance with gait tasks by 02/13/16   Time 4   Period Weeks   Status Partially Met   PT LONG TERM GOAL #8   Title Patient will increase BLE gross strength to 4+/5 as to improve functional strength for independent gait, increased standing tolerance and increased ADL ability. by 02/13/16   Time 8   Period Weeks   Status Partially Met               Plan - 01/16/16 1758    Clinical Impression Statement Patient reports being able to walk around her neighborhood with her niece but did have increased right ankle pain having difficulty getting back to her apartment. She is walking independently without AD, without boot but has increased right ankle discomfort. She does demonstrate improve gait speed however is still  considered a limited community ambulator at increased risk for falls; She is able to negotiate steps with rail assist, but has increased pain with descending steps; She exhibits improved right ankle ROM but is still limited with ankle DF due to weakness and pain. Patient would benefit from additional skilled PT Intervention to improve right ankle strength and ROM; She does respond well to TENs with less ankle pain and would benefit from a home unit.    Pt will benefit from skilled therapeutic intervention in order to improve on the following deficits Abnormal gait;Decreased endurance;Impaired sensation;Decreased activity tolerance;Decreased strength;Pain;Decreased balance;Decreased mobility;Difficulty walking;Decreased range of motion;Decreased safety awareness;Impaired flexibility   Rehab Potential Fair   Clinical Impairments Affecting Rehab Potential positive: motivation to get better, negative: chronic condition, anxious, co-morbidities; Clinical presentation is unstable as her pain comes and goes with no pattern, severity of pain limits mobility, uncontrolled anxiety   PT Frequency 2x / week   PT Duration 6 weeks   PT Treatment/Interventions Cryotherapy;Moist Heat;Balance training;Therapeutic exercise;Therapeutic activities;Functional mobility training;Stair training;Gait training;ADLs/Self Care Home Management;Neuromuscular re-education;Patient/family education;Manual techniques;Taping;Energy conservation;Dry needling;Passive range of motion;Electrical Stimulation;Ultrasound   PT Next Visit Plan work on strengthening, balance/TENs   PT Home Exercise Plan continue as previously given   Consulted and Agree with Plan of Care Patient        Problem List Patient Active Problem List   Diagnosis Date Noted  . Malleolar fracture 12/10/2015  . Complex regional pain syndrome of lower extremity 12/10/2015  . Hx MRSA infection 06/18/2015  . Abnormal chest x-ray 05/29/2015  . Family history of colon  cancer 02/21/2013  . TMJ (dislocation of temporomandibular joint) 02/21/2013  . Tobacco abuse 02/21/2013  . Allergy   . Skin cancer   . Peptic ulcer disease   . Generalized anxiety disorder   . Herpes simplex without mention of  complication   . IBS (irritable bowel syndrome)   . History of migraine     Trotter,Margaret PT, DPT 01/16/2016, 6:02 PM  [place] MAIN [facility] SERVICES [address] [place], [place], [address] Phone: [phone]   Fax:  [phone]  Name: Danielle Strickland MRN: [id number] Date of Birth: 12/31/1965

## 2016-01-16 NOTE — Addendum Note (Signed)
Addended by: Hillis Range on: 01/16/2016 06:17 PM   Modules accepted: Orders

## 2016-01-21 ENCOUNTER — Other Ambulatory Visit: Payer: Self-pay | Admitting: Family Medicine

## 2016-01-21 DIAGNOSIS — G90521 Complex regional pain syndrome I of right lower limb: Secondary | ICD-10-CM

## 2016-01-21 DIAGNOSIS — M25571 Pain in right ankle and joints of right foot: Secondary | ICD-10-CM

## 2016-01-22 ENCOUNTER — Encounter: Payer: Self-pay | Admitting: Physical Therapy

## 2016-01-22 ENCOUNTER — Ambulatory Visit: Payer: Self-pay | Admitting: Physical Therapy

## 2016-01-22 DIAGNOSIS — M25571 Pain in right ankle and joints of right foot: Secondary | ICD-10-CM

## 2016-01-22 DIAGNOSIS — R262 Difficulty in walking, not elsewhere classified: Secondary | ICD-10-CM

## 2016-01-22 DIAGNOSIS — R531 Weakness: Secondary | ICD-10-CM

## 2016-01-22 NOTE — Therapy (Signed)
[place] MAIN [facility] SERVICES [address] [place], [place], [address] Phone: [phone]   Fax:  [phone]  Physical Therapy Treatment  Patient Details  Name: FRANCHESCA VENEZIANO MRN: [id number] Date of Birth: [birth year] Referring Provider: Wylene Simmer MD  Encounter Date: 01/22/2016      PT End of Session - 01/22/16 1543    Visit Number 17   Number of Visits 29   Date for PT Re-Evaluation 02/13/16   PT Start Time 1100   PT Stop Time 1200   PT Time Calculation (min) 60 min   Activity Tolerance Patient limited by pain   Behavior During Therapy [facility] for tasks assessed/performed;Anxious      Past Medical History  Diagnosis Date  . HPV in female   . Abnormal Pap smear of vagina and vaginal HPV     ascus   . Migraines   . Microscopic hematuria   . Mental disorder     anxiety  . Endometriosis   . 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 GERD (gastroesophageal reflux disease)   . IBS (irritable bowel syndrome)   . Liver disease     H/O hepatitis  . Allergy   . Peptic ulcer disease   . History of migraine   . Skin cancer     left thigh    Past Surgical History  Procedure Laterality Date  . Laparoscopically assisted vaginal hysterectomy  2006  . Tubal ligation    . Wisdom tooth extraction    . Salivary gland surgery    . Abdominal hysterectomy    . Appendectomy    . Cholecystectomy N/A 11/26/2015    Procedure: LAPAROSCOPIC CHOLECYSTECTOMY ;  Surgeon: Jules Husbands, MD;  Location: [facility] ORS;  Service: General;  Laterality: N/A;    There were no vitals filed for this visit.  Visit Diagnosis:  Right ankle pain  Difficulty walking  Weakness      Subjective Assessment - 01/22/16 1109    Subjective Patient reports going to [place]. She had to get a shuttle, [place] which [place]; Patient reports having difficulty with going up/down steps and walking all around to the point  she couldn't walk the next day;    Pertinent History Personal factors affecting rehab: anxiety/depression, fear of injuring right ankle, current fracture in ankle, severe pain, decreased balance and high fall risk, decreased caregiver support at home;    How long can you sit comfortably? feels tingling/burning;    How long can you stand comfortably? 15 min   How long can you walk comfortably? never comfortable with walking; increased pain with all walking;    Diagnostic tests MRI in September of right ankle and was diagnosed with ankle fracture; her last x-ray shows that it is still broken but is healing from the outside in;    Currently in Pain? Yes   Pain Score 4    Pain Location Ankle   Pain Orientation Right   Pain Descriptors / Indicators Burning   Pain Type Chronic pain   Pain Onset More than a month ago        TREATMENT: Warm up on recumbent bike, BLE only level 1 x4 min (2 min forward/2 min backward), unbilled;  Leg press, RLE only, with dyna disc 45# x15 Leg press, RLE only heel raise without dyna disc 45# 2x10 with mod VCs to increase ankle DF at rest for better ankle ROM;  Patient requires min VCs to slow down LE movement and increase ROM for better strengthening;  Weaving around cones #5 x1 rep, Walking beside cone, with single LE toe tap on cone for SLS x5 reps each LE;  Side stepping on narrow beam, with heel off beam and toe on beam x2 laps each direction;  Long sitting, RLE ankle DF green tband x15   Finished with interferential TENs to right ankle, at tolerated intensity, concurrent with moist heat x15 min   Patient reports significantly less ankle pain after treatment session as compared to start of session;                             PT Education - 01/22/16 1543    Education provided Yes   Education Details LE strengthening, stability exercise   Person(s) Educated Patient   Methods Explanation;Verbal cues   Comprehension Verbalized  understanding;Returned demonstration;Verbal cues required             PT Long Term Goals - 01/16/16 1042    PT LONG TERM GOAL #1   Title Patient will be independent in home exercise program to improve strength/mobility for better functional independence with ADLs by 02/13/16   Time 8   Period Weeks   Status On-going   PT LONG TERM GOAL #2   Title Patient (< 72 years old) will complete five times sit to stand test in < 10 seconds indicating an increased LE strength and improved balance by 02/13/16   Time 8   Period Weeks   Status Partially Met   PT LONG TERM GOAL #3   Title Patient will increase 10 meter walk test to >1.74ms as to improve gait speed for better community ambulation and to reduce fall risk. by 02/13/16   Time 8   Period Weeks   Status Partially Met   PT LONG TERM GOAL #4   Title Patient will ascend/descend 4 stairs with 1 rail assist independently forward reciprocally without loss of balance to improve ability to get in/out of home. by 02/13/16   Time 8   Period Weeks   Status Achieved   PT LONG TERM GOAL #5   Title Patient will report a worst pain of 3/10 on VAS in  right ankle           to improve tolerance with ADLs and reduced symptoms with activities. by 02/13/16   Time 8   Period Weeks   Status Not Met   PT LONG TERM GOAL #6   Title Patient will increase lower extremity functional scale to >60/80 to demonstrate improved functional mobility and increased tolerance with ADLs. by 02/13/16   Time 8   Period Weeks   Status Partially Met   PT LONG TERM GOAL #7   Title Patient will increase RLE ankle AROM DF: >5 degrees, PF: >60 degrees, IV: >15 degrees, EV: >15 degrees for increased functional ROM to improve foot clearance with gait tasks by 02/13/16   Time 4   Period Weeks   Status Partially Met   PT LONG TERM GOAL #8   Title Patient will increase BLE gross strength to 4+/5 as to improve functional strength for independent gait, increased standing  tolerance and increased ADL ability. by 02/13/16   Time 8   Period Weeks   Status Partially Met               Plan - 01/22/16 1543  Clinical Impression Statement Instructed patient in advanced LE strengthening and stability exercise. She was able to demonstrate better RLE ankle DF with eccentric return of heel raises on leg press with increased pain. Patient reports increased burning with all active ankle DF however pain is relieved with TENs. Patient would benefit from additional skilled PT intervention to improve LE strength, balance/gait safety and reduce ankle pain;    Pt will benefit from skilled therapeutic intervention in order to improve on the following deficits Abnormal gait;Decreased endurance;Impaired sensation;Decreased activity tolerance;Decreased strength;Pain;Decreased balance;Decreased mobility;Difficulty walking;Decreased range of motion;Decreased safety awareness;Impaired flexibility   Rehab Potential Fair   Clinical Impairments Affecting Rehab Potential positive: motivation to get better, negative: chronic condition, anxious, co-morbidities; Clinical presentation is unstable as her pain comes and goes with no pattern, severity of pain limits mobility, uncontrolled anxiety   PT Frequency 2x / week   PT Duration 6 weeks   PT Treatment/Interventions Cryotherapy;Moist Heat;Balance training;Therapeutic exercise;Therapeutic activities;Functional mobility training;Stair training;Gait training;ADLs/Self Care Home Management;Neuromuscular re-education;Patient/family education;Manual techniques;Taping;Energy conservation;Dry needling;Passive range of motion;Electrical Stimulation;Ultrasound   PT Next Visit Plan work on strengthening, balance/TENs   PT Home Exercise Plan continue as previously given   Consulted and Agree with Plan of Care Patient        Problem List Patient Active Problem List   Diagnosis Date Noted  . Malleolar fracture 12/10/2015  . Complex regional  pain syndrome of lower extremity 12/10/2015  . Hx MRSA infection 06/18/2015  . Abnormal chest x-ray 05/29/2015  . Family history of colon cancer 02/21/2013  . TMJ (dislocation of temporomandibular joint) 02/21/2013  . Tobacco abuse 02/21/2013  . Allergy   . Skin cancer   . Peptic ulcer disease   . Generalized anxiety disorder   . Herpes simplex without mention of complication   . IBS (irritable bowel syndrome)   . History of migraine     Bhargav Barbaro PT, DPT 01/22/2016, 3:47 PM  [place] MAIN [facility] SERVICES [address] [place], [place], [address] Phone: [phone]   Fax:  [phone]  Name: PERLA ECHAVARRIA MRN: [id number] Date of Birth: 05/02/1966

## 2016-01-24 ENCOUNTER — Ambulatory Visit: Payer: Self-pay | Admitting: Physical Therapy

## 2016-01-24 ENCOUNTER — Encounter: Payer: Self-pay | Admitting: Physical Therapy

## 2016-01-24 DIAGNOSIS — R531 Weakness: Secondary | ICD-10-CM

## 2016-01-24 DIAGNOSIS — R262 Difficulty in walking, not elsewhere classified: Secondary | ICD-10-CM

## 2016-01-24 DIAGNOSIS — M25571 Pain in right ankle and joints of right foot: Secondary | ICD-10-CM

## 2016-01-24 NOTE — Therapy (Signed)
[place] MAIN [facility] SERVICES [address] [place], [place], [address] Phone: [phone]   Fax:  [phone]  Physical Therapy Treatment  Patient Details  Name: Danielle Strickland MRN: [id number] Date of Birth: 1965-11-10 Referring Provider: Wylene Simmer MD  Encounter Date: 01/24/2016      PT End of Session - 01/24/16 1021    Visit Number 18   Number of Visits 29   Date for PT Re-Evaluation 02/13/16   PT Start Time 1005   PT Stop Time 1055   PT Time Calculation (min) 50 min   Activity Tolerance Patient limited by pain   Behavior During Therapy [facility] for tasks assessed/performed;Anxious      Past Medical History  Diagnosis Date  . HPV in female   . Abnormal Pap smear of vagina and vaginal HPV     ascus   . Migraines   . Microscopic hematuria   . Mental disorder     anxiety  . Endometriosis   . 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 GERD (gastroesophageal reflux disease)   . IBS (irritable bowel syndrome)   . Liver disease     H/O hepatitis  . Allergy   . Peptic ulcer disease   . History of migraine   . Skin cancer     left thigh    Past Surgical History  Procedure Laterality Date  . Laparoscopically assisted vaginal hysterectomy  2006  . Tubal ligation    . Wisdom tooth extraction    . Salivary gland surgery    . Abdominal hysterectomy    . Appendectomy    . Cholecystectomy N/A 11/26/2015    Procedure: LAPAROSCOPIC CHOLECYSTECTOMY ;  Surgeon: Jules Husbands, MD;  Location: [facility] ORS;  Service: General;  Laterality: N/A;    There were no vitals filed for this visit.  Visit Diagnosis:  Right ankle pain  Difficulty walking  Weakness      Subjective Assessment - 01/24/16 1016    Subjective Patient reports having a little sorenes in right ankle at 3/10; She reports compliance with HEP; She was unable to go for a walk with her friend because of the weather;    Pertinent History Personal factors  affecting rehab: anxiety/depression, fear of injuring right ankle, current fracture in ankle, severe pain, decreased balance and high fall risk, decreased caregiver support at home;    How long can you sit comfortably? feels tingling/burning;    How long can you stand comfortably? 15 min   How long can you walk comfortably? never comfortable with walking; increased pain with all walking;    Diagnostic tests MRI in September of right ankle and was diagnosed with ankle fracture; her last x-ray shows that it is still broken but is healing from the outside in;    Currently in Pain? Yes   Pain Score 3    Pain Location Ankle   Pain Orientation Right   Pain Descriptors / Indicators Aching;Burning   Pain Type Chronic pain   Pain Onset More than a month ago            [facility] PT Assessment - 01/24/16 0001    AROM   Right Ankle Dorsiflexion -2       TREATMENT: Warm up on Spin bike, 1 min forward, 1 min backward; then standing with full revolution x5 reps;  Standing on dyna disc (one foot ahead of other) Forward/backward rocking x10 each LE forward; Mini  lunge x5 with minimal rail assist; Standing with one foot ahead of other and do balloon taps x10 reps each UE  All of these repeated with each foot in front on dyna disc;  Red tband around both feet (around toes) forward/backward walking 10 feet x1 each direction;  Resisted walking gait, 12.5# side step with cross over x1 lap each direction with min A for balance and mod VCs to increase weight shift to increase balance control;   Finished with interferential TENs, at tolerated intensity x15 min, concurrent with moist heat; Patient reports less burning after treatment session;                          PT Education - 01/24/16 1021    Education provided Yes   Education Details LE strengthening, stability exercise;    Person(s) Educated Patient   Methods Explanation;Verbal cues   Comprehension Verbalized  understanding;Returned demonstration;Verbal cues required             PT Long Term Goals - 01/16/16 1042    PT LONG TERM GOAL #1   Title Patient will be independent in home exercise program to improve strength/mobility for better functional independence with ADLs by 02/13/16   Time 8   Period Weeks   Status On-going   PT LONG TERM GOAL #2   Title Patient (< 13 years old) will complete five times sit to stand test in < 10 seconds indicating an increased LE strength and improved balance by 02/13/16   Time 8   Period Weeks   Status Partially Met   PT LONG TERM GOAL #3   Title Patient will increase 10 meter walk test to >1.66ms as to improve gait speed for better community ambulation and to reduce fall risk. by 02/13/16   Time 8   Period Weeks   Status Partially Met   PT LONG TERM GOAL #4   Title Patient will ascend/descend 4 stairs with 1 rail assist independently forward reciprocally without loss of balance to improve ability to get in/out of home. by 02/13/16   Time 8   Period Weeks   Status Achieved   PT LONG TERM GOAL #5   Title Patient will report a worst pain of 3/10 on VAS in  right ankle           to improve tolerance with ADLs and reduced symptoms with activities. by 02/13/16   Time 8   Period Weeks   Status Not Met   PT LONG TERM GOAL #6   Title Patient will increase lower extremity functional scale to >60/80 to demonstrate improved functional mobility and increased tolerance with ADLs. by 02/13/16   Time 8   Period Weeks   Status Partially Met   PT LONG TERM GOAL #7   Title Patient will increase RLE ankle AROM DF: >5 degrees, PF: >60 degrees, IV: >15 degrees, EV: >15 degrees for increased functional ROM to improve foot clearance with gait tasks by 02/13/16   Time 4   Period Weeks   Status Partially Met   PT LONG TERM GOAL #8   Title Patient will increase BLE gross strength to 4+/5 as to improve functional strength for independent gait, increased standing  tolerance and increased ADL ability. by 02/13/16   Time 8   Period Weeks   Status Partially Met               Plan - 01/24/16 1154    Clinical  Impression Statement Instructed patient in advanced RLE ankle ROM/stability exercise. Patient reports increased burning with increased weight bearing and increased ankle DF exercise. She was able to complete exercise tasks but is slow. Patient also had difficulty with weight shift with resisted side step with cross overs due to increased ankle instability. She does respond well to TENs with less burning pain. She would benefit from additional skilled PT Intervention to improve ankle strengthening, balance/gait safety;    Pt will benefit from skilled therapeutic intervention in order to improve on the following deficits Abnormal gait;Decreased endurance;Impaired sensation;Decreased activity tolerance;Decreased strength;Pain;Decreased balance;Decreased mobility;Difficulty walking;Decreased range of motion;Decreased safety awareness;Impaired flexibility   Rehab Potential Fair   Clinical Impairments Affecting Rehab Potential positive: motivation to get better, negative: chronic condition, anxious, co-morbidities; Clinical presentation is unstable as her pain comes and goes with no pattern, severity of pain limits mobility, uncontrolled anxiety   PT Frequency 2x / week   PT Duration 6 weeks   PT Treatment/Interventions Cryotherapy;Moist Heat;Balance training;Therapeutic exercise;Therapeutic activities;Functional mobility training;Stair training;Gait training;ADLs/Self Care Home Management;Neuromuscular re-education;Patient/family education;Manual techniques;Taping;Energy conservation;Dry needling;Passive range of motion;Electrical Stimulation;Ultrasound   PT Next Visit Plan work on strengthening, balance/TENs   PT Home Exercise Plan continue as previously given   Consulted and Agree with Plan of Care Patient        Problem List Patient Active Problem  List   Diagnosis Date Noted  . Malleolar fracture 12/10/2015  . Complex regional pain syndrome of lower extremity 12/10/2015  . Hx MRSA infection 06/18/2015  . Abnormal chest x-ray 05/29/2015  . Family history of colon cancer 02/21/2013  . TMJ (dislocation of temporomandibular joint) 02/21/2013  . Tobacco abuse 02/21/2013  . Allergy   . Skin cancer   . Peptic ulcer disease   . Generalized anxiety disorder   . Herpes simplex without mention of complication   . IBS (irritable bowel syndrome)   . History of migraine      Jamile Sivils PT, DPT 01/24/2016, 11:56 AM  [place] MAIN [facility] SERVICES [address] [place], [place], [address] Phone: [phone]   Fax:  [phone]  Name: Danielle Strickland MRN: [id number] Date of Birth: [birth year]

## 2016-01-27 ENCOUNTER — Ambulatory Visit: Payer: Self-pay | Attending: Orthopedic Surgery | Admitting: Physical Therapy

## 2016-01-27 ENCOUNTER — Encounter: Payer: Self-pay | Admitting: Physical Therapy

## 2016-01-27 DIAGNOSIS — M25571 Pain in right ankle and joints of right foot: Secondary | ICD-10-CM | POA: Insufficient documentation

## 2016-01-27 DIAGNOSIS — R531 Weakness: Secondary | ICD-10-CM | POA: Insufficient documentation

## 2016-01-27 DIAGNOSIS — M6281 Muscle weakness (generalized): Secondary | ICD-10-CM | POA: Insufficient documentation

## 2016-01-27 DIAGNOSIS — R262 Difficulty in walking, not elsewhere classified: Secondary | ICD-10-CM | POA: Insufficient documentation

## 2016-01-27 NOTE — Therapy (Signed)
[place] MAIN [facility] SERVICES [address] [place], [place], [address] Phone: [phone]   Fax:  [phone]  Physical Therapy Treatment  Patient Details  Name: Danielle Strickland MRN: [id number] Date of Birth: August 21, 1966 Referring Provider: Wylene Simmer MD  Encounter Date: 01/27/2016      PT End of Session - 01/27/16 1201    Visit Number 19   Number of Visits 29   Date for PT Re-Evaluation 02/13/16   PT Start Time 1117   PT Stop Time 1201   PT Time Calculation (min) 44 min   Activity Tolerance Patient limited by pain   Behavior During Therapy [facility] for tasks assessed/performed;Anxious      Past Medical History  Diagnosis Date  . HPV in female   . Abnormal Pap smear of vagina and vaginal HPV     ascus   . Migraines   . Microscopic hematuria   . Mental disorder     anxiety  . Endometriosis   . 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 GERD (gastroesophageal reflux disease)   . IBS (irritable bowel syndrome)   . Liver disease     H/O hepatitis  . Allergy   . Peptic ulcer disease   . History of migraine   . Skin cancer     left thigh    Past Surgical History  Procedure Laterality Date  . Laparoscopically assisted vaginal hysterectomy  2006  . Tubal ligation    . Wisdom tooth extraction    . Salivary gland surgery    . Abdominal hysterectomy    . Appendectomy    . Cholecystectomy N/A 11/26/2015    Procedure: LAPAROSCOPIC CHOLECYSTECTOMY ;  Surgeon: Jules Husbands, MD;  Location: [facility] ORS;  Service: General;  Laterality: N/A;    There were no vitals filed for this visit.  Visit Diagnosis:  Right ankle pain  Difficulty walking  Weakness      Subjective Assessment - 01/27/16 1124    Subjective Patient reports having increased soreness in right ankle over the weekend without change in activity; She went to skin care doctor and had a spot biopsy and is fearful of skin cancer;    Pertinent History  Personal factors affecting rehab: anxiety/depression, fear of injuring right ankle, current fracture in ankle, severe pain, decreased balance and high fall risk, decreased caregiver support at home;    How long can you sit comfortably? feels tingling/burning;    How long can you stand comfortably? 15 min   How long can you walk comfortably? never comfortable with walking; increased pain with all walking;    Diagnostic tests MRI in September of right ankle and was diagnosed with ankle fracture; her last x-ray shows that it is still broken but is healing from the outside in;    Currently in Pain? Yes   Pain Score 3    Pain Location Ankle   Pain Orientation Right   Pain Descriptors / Indicators Aching;Burning   Pain Type Chronic pain   Pain Onset More than a month ago         TREATMENT: Warm up on recumbent bike, level 0 x4 min (2 min forward/2 min backward) (unbilled);  Leg press, BLE 105# x10; Patient slow with leg press machine due to weakness and pain;  Leg press, RLE only 60# with dynadisc 2x10; Exhibits increased muscle fatigue with fasciculation; Did require min VCs to slow down eccentric return for better strengthening;  Leg press, RLE only 60# heel raise without dynadisc x15 with cues to relax foot into ankle DF for  Better flexibility and strengthening;   Hip abduction 7.5# at matrix machine x10 bilaterally with cues to avoid trunk lean for better strengthening;   Finished with interferential TENs, at tolerated intensity x15 min, concurrent with moist heat; Patient reports less burning after treatment session;                           PT Education - 01/27/16 1200    Education provided Yes   Education Details LE strengthening, TENs   Person(s) Educated Patient   Methods Explanation;Verbal cues   Comprehension Verbalized understanding;Returned demonstration;Verbal cues required             PT Long Term Goals - 01/16/16 1042    PT LONG TERM GOAL  #1   Title Patient will be independent in home exercise program to improve strength/mobility for better functional independence with ADLs by 02/13/16   Time 8   Period Weeks   Status On-going   PT LONG TERM GOAL #2   Title Patient (< 21 years old) will complete five times sit to stand test in < 10 seconds indicating an increased LE strength and improved balance by 02/13/16   Time 8   Period Weeks   Status Partially Met   PT LONG TERM GOAL #3   Title Patient will increase 10 meter walk test to >1.13ms as to improve gait speed for better community ambulation and to reduce fall risk. by 02/13/16   Time 8   Period Weeks   Status Partially Met   PT LONG TERM GOAL #4   Title Patient will ascend/descend 4 stairs with 1 rail assist independently forward reciprocally without loss of balance to improve ability to get in/out of home. by 02/13/16   Time 8   Period Weeks   Status Achieved   PT LONG TERM GOAL #5   Title Patient will report a worst pain of 3/10 on VAS in  right ankle           to improve tolerance with ADLs and reduced symptoms with activities. by 02/13/16   Time 8   Period Weeks   Status Not Met   PT LONG TERM GOAL #6   Title Patient will increase lower extremity functional scale to >60/80 to demonstrate improved functional mobility and increased tolerance with ADLs. by 02/13/16   Time 8   Period Weeks   Status Partially Met   PT LONG TERM GOAL #7   Title Patient will increase RLE ankle AROM DF: >5 degrees, PF: >60 degrees, IV: >15 degrees, EV: >15 degrees for increased functional ROM to improve foot clearance with gait tasks by 02/13/16   Time 4   Period Weeks   Status Partially Met   PT LONG TERM GOAL #8   Title Patient will increase BLE gross strength to 4+/5 as to improve functional strength for independent gait, increased standing tolerance and increased ADL ability. by 02/13/16   Time 8   Period Weeks   Status Partially Met               Plan - 01/27/16 1201     Clinical Impression Statement Instructed patient in LE strengthening. Advanced strengthening with increased resistance on leg press. Patient exhibits increased muscle fasiculation and instability due to weakness. Patient does respond well with TENs. She would benefit from additional skilled  PT intervention to improve LE strength balance and ankle stability;    Pt will benefit from skilled therapeutic intervention in order to improve on the following deficits Abnormal gait;Decreased endurance;Impaired sensation;Decreased activity tolerance;Decreased strength;Pain;Decreased balance;Decreased mobility;Difficulty walking;Decreased range of motion;Decreased safety awareness;Impaired flexibility   Rehab Potential Fair   Clinical Impairments Affecting Rehab Potential positive: motivation to get better, negative: chronic condition, anxious, co-morbidities; Clinical presentation is unstable as her pain comes and goes with no pattern, severity of pain limits mobility, uncontrolled anxiety   PT Frequency 2x / week   PT Duration 6 weeks   PT Treatment/Interventions Cryotherapy;Moist Heat;Balance training;Therapeutic exercise;Therapeutic activities;Functional mobility training;Stair training;Gait training;ADLs/Self Care Home Management;Neuromuscular re-education;Patient/family education;Manual techniques;Taping;Energy conservation;Dry needling;Passive range of motion;Electrical Stimulation;Ultrasound   PT Next Visit Plan work on strengthening, balance/TENs   PT Home Exercise Plan continue as previously given   Consulted and Agree with Plan of Care Patient        Problem List Patient Active Problem List   Diagnosis Date Noted  . Malleolar fracture 12/10/2015  . Complex regional pain syndrome of lower extremity 12/10/2015  . Hx MRSA infection 06/18/2015  . Abnormal chest x-ray 05/29/2015  . Family history of colon cancer 02/21/2013  . TMJ (dislocation of temporomandibular joint) 02/21/2013  . Tobacco  abuse 02/21/2013  . Allergy   . Skin cancer   . Peptic ulcer disease   . Generalized anxiety disorder   . Herpes simplex without mention of complication   . IBS (irritable bowel syndrome)   . History of migraine     Treydon Henricks PT, DPT 01/27/2016, 12:06 PM  [place] MAIN [facility] SERVICES [address] [place], [place], [address] Phone: [phone]   Fax:  [phone]  Name: Danielle Strickland MRN: [id number] Date of Birth: [birth year]

## 2016-01-29 ENCOUNTER — Encounter: Payer: Self-pay | Admitting: Physical Therapy

## 2016-01-29 ENCOUNTER — Ambulatory Visit: Payer: Self-pay | Admitting: Physical Therapy

## 2016-01-29 DIAGNOSIS — R531 Weakness: Secondary | ICD-10-CM

## 2016-01-29 DIAGNOSIS — M25571 Pain in right ankle and joints of right foot: Secondary | ICD-10-CM

## 2016-01-29 DIAGNOSIS — R262 Difficulty in walking, not elsewhere classified: Secondary | ICD-10-CM

## 2016-01-29 NOTE — Therapy (Signed)
[place] MAIN [facility] SERVICES [address] [place], [place], [address] Phone: [phone]   Fax:  [phone]  Physical Therapy Treatment  Patient Details  Name: Danielle Strickland MRN: [id number] Date of Birth: 1966/02/28 Referring Provider: Wylene Simmer MD  Encounter Date: 01/29/2016      PT End of Session - 01/29/16 1205    Visit Number 20   Number of Visits 29   Date for PT Re-Evaluation 02/13/16   PT Start Time 1100   PT Stop Time 1200   PT Time Calculation (min) 60 min   Activity Tolerance Patient tolerated treatment well   Behavior During Therapy [facility] for tasks assessed/performed      Past Medical History  Diagnosis Date  . HPV in female   . Abnormal Pap smear of vagina and vaginal HPV     ascus   . Migraines   . Microscopic hematuria   . Mental disorder     anxiety  . Endometriosis   . 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 GERD (gastroesophageal reflux disease)   . IBS (irritable bowel syndrome)   . Liver disease     H/O hepatitis  . Allergy   . Peptic ulcer disease   . History of migraine   . Skin cancer     left thigh    Past Surgical History  Procedure Laterality Date  . Laparoscopically assisted vaginal hysterectomy  2006  . Tubal ligation    . Wisdom tooth extraction    . Salivary gland surgery    . Abdominal hysterectomy    . Appendectomy    . Cholecystectomy N/A 11/26/2015    Procedure: LAPAROSCOPIC CHOLECYSTECTOMY ;  Surgeon: Jules Husbands, MD;  Location: [facility] ORS;  Service: General;  Laterality: N/A;    There were no vitals filed for this visit.  Visit Diagnosis:  Right ankle pain  Difficulty walking  Weakness      Subjective Assessment - 01/29/16 1113    Subjective Patient reports doing a lot of walking yesterday outside with increaed burning last night; She reports trying aspercreme with no change; Today has a little burning;    Pertinent History Personal factors  affecting rehab: anxiety/depression, fear of injuring right ankle, current fracture in ankle, severe pain, decreased balance and high fall risk, decreased caregiver support at home;    How long can you sit comfortably? feels tingling/burning;    How long can you stand comfortably? 15 min   How long can you walk comfortably? never comfortable with walking; increased pain with all walking;    Diagnostic tests MRI in September of right ankle and was diagnosed with ankle fracture; her last x-ray shows that it is still broken but is healing from the outside in;    Currently in Pain? Yes   Pain Score 3    Pain Location Ankle   Pain Orientation Right   Pain Descriptors / Indicators Burning   Pain Type Chronic pain   Pain Onset More than a month ago        TREATMENT: Warm up on Spin bike, 1 min forward, 1 min backward; then standing with full revolution x1 min; Patient still hesitant with the standing spin due to right ankle pain;   Forward step ups on 8 inch step without rail assist x5 each LE; Patient was slow with LE movement due to fear of falling;  Alternate toe taps on 8 inch step working on  quick feet x1 min; One foot on dyna disc, one foot on step with UE balloon taps x10 each LE on top;  Standing on trampoline: Heel/toe raises x10; Alternate march x10 with cues to increase hip flexion for better weight shift and strengthening; Squat jump x10 with cues to increase ankle PF for better ankle mobility;  Long sitting RLE ankle DF green tband x15;  PT performed Manual therapy: PROM of right ankle DF stretch 10 sec hold x5 reps; Grade II-III AP mobs of talocrual joint, 10 sec bouts x5 reps,  Mobilization with movement, RLE ankle DF AROM with grade II-III AP mobs to talocrual joint x5 reps;   Finished with interferential TENs, at tolerated intensity x15 min, concurrent with moist heat; Patient reports less burning after treatment session;                                    PT Education - 01/29/16 1204    Education provided Yes   Education Details LE strengthening, TENs, balance exercise;    Person(s) Educated Patient   Methods Explanation;Verbal cues   Comprehension Verbalized understanding;Returned demonstration;Verbal cues required             PT Long Term Goals - 01/16/16 1042    PT LONG TERM GOAL #1   Title Patient will be independent in home exercise program to improve strength/mobility for better functional independence with ADLs by 02/13/16   Time 8   Period Weeks   Status On-going   PT LONG TERM GOAL #2   Title Patient (< 78 years old) will complete five times sit to stand test in < 10 seconds indicating an increased LE strength and improved balance by 02/13/16   Time 8   Period Weeks   Status Partially Met   PT LONG TERM GOAL #3   Title Patient will increase 10 meter walk test to >1.47ms as to improve gait speed for better community ambulation and to reduce fall risk. by 02/13/16   Time 8   Period Weeks   Status Partially Met   PT LONG TERM GOAL #4   Title Patient will ascend/descend 4 stairs with 1 rail assist independently forward reciprocally without loss of balance to improve ability to get in/out of home. by 02/13/16   Time 8   Period Weeks   Status Achieved   PT LONG TERM GOAL #5   Title Patient will report a worst pain of 3/10 on VAS in  right ankle           to improve tolerance with ADLs and reduced symptoms with activities. by 02/13/16   Time 8   Period Weeks   Status Not Met   PT LONG TERM GOAL #6   Title Patient will increase lower extremity functional scale to >60/80 to demonstrate improved functional mobility and increased tolerance with ADLs. by 02/13/16   Time 8   Period Weeks   Status Partially Met   PT LONG TERM GOAL #7   Title Patient will increase RLE ankle AROM DF: >5 degrees, PF: >60 degrees, IV: >15 degrees, EV: >15 degrees for increased  functional ROM to improve foot clearance with gait tasks by 02/13/16   Time 4   Period Weeks   Status Partially Met   PT LONG TERM GOAL #8   Title Patient will increase BLE gross strength to 4+/5 as to improve functional strength for independent gait, increased  standing tolerance and increased ADL ability. by 02/13/16   Time 8   Period Weeks   Status Partially Met               Plan - 01/29/16 1205    Clinical Impression Statement Patient instructed in LE strengthening and stability exercise. Advanced exercise with standing on uneven surfaces and working on higher level tasks such as jumping and quick movements; Patient has increased burning with ankle DF. She continues to respond well with TENs with less pain after treatment session; She would benefit from additional skilled PT intervention to improve LE strength, balance/gait safety;    Pt will benefit from skilled therapeutic intervention in order to improve on the following deficits Abnormal gait;Decreased endurance;Impaired sensation;Decreased activity tolerance;Decreased strength;Pain;Decreased balance;Decreased mobility;Difficulty walking;Decreased range of motion;Decreased safety awareness;Impaired flexibility   Rehab Potential Fair   Clinical Impairments Affecting Rehab Potential positive: motivation to get better, negative: chronic condition, anxious, co-morbidities; Clinical presentation is unstable as her pain comes and goes with no pattern, severity of pain limits mobility, uncontrolled anxiety   PT Frequency 2x / week   PT Duration 6 weeks   PT Treatment/Interventions Cryotherapy;Moist Heat;Balance training;Therapeutic exercise;Therapeutic activities;Functional mobility training;Stair training;Gait training;ADLs/Self Care Home Management;Neuromuscular re-education;Patient/family education;Manual techniques;Taping;Energy conservation;Dry needling;Passive range of motion;Electrical Stimulation;Ultrasound   PT Next Visit Plan  work on strengthening, balance/TENs   PT Home Exercise Plan continue as previously given   Consulted and Agree with Plan of Care Patient        Problem List Patient Active Problem List   Diagnosis Date Noted  . Malleolar fracture 12/10/2015  . Complex regional pain syndrome of lower extremity 12/10/2015  . Hx MRSA infection 06/18/2015  . Abnormal chest x-ray 05/29/2015  . Family history of colon cancer 02/21/2013  . TMJ (dislocation of temporomandibular joint) 02/21/2013  . Tobacco abuse 02/21/2013  . Allergy   . Skin cancer   . Peptic ulcer disease   . Generalized anxiety disorder   . Herpes simplex without mention of complication   . IBS (irritable bowel syndrome)   . History of migraine     Baby Stairs PT, DPT 01/29/2016, 12:07 PM  [place] MAIN [facility] SERVICES [address] [place], [place], [address] Phone: [phone]   Fax:  [phone]  Name: Danielle Strickland MRN: [id number] Date of Birth: 06/09/1966

## 2016-01-30 ENCOUNTER — Other Ambulatory Visit: Payer: Self-pay | Admitting: Family Medicine

## 2016-01-30 NOTE — Telephone Encounter (Signed)
Ok to refill one time only.  Needs 30 min OV for further refills. 

## 2016-01-30 NOTE — Telephone Encounter (Signed)
Pt has not had any f/u regarding anxiety.

## 2016-01-31 NOTE — Telephone Encounter (Signed)
Rx called in to requested pharmacy. Lm on pts vm and advised her OV required for additional refills. Advised pt to contact office to schedule 30 min appt

## 2016-02-03 ENCOUNTER — Encounter: Payer: Self-pay | Admitting: Physical Therapy

## 2016-02-03 ENCOUNTER — Ambulatory Visit: Payer: Self-pay | Admitting: Physical Therapy

## 2016-02-03 DIAGNOSIS — R262 Difficulty in walking, not elsewhere classified: Secondary | ICD-10-CM

## 2016-02-03 DIAGNOSIS — M25571 Pain in right ankle and joints of right foot: Secondary | ICD-10-CM

## 2016-02-03 DIAGNOSIS — R531 Weakness: Secondary | ICD-10-CM

## 2016-02-03 NOTE — Therapy (Signed)
[place] MAIN [facility] SERVICES [address] [place], [place], [address] Phone: [phone]   Fax:  [phone]  Physical Therapy Treatment  Patient Details  Name: Danielle Strickland MRN: [id number] Date of Birth: 09-01-1966 Referring Provider: Wylene Simmer MD  Encounter Date: 02/03/2016      PT End of Session - 02/03/16 1158    Visit Number 21   Number of Visits 29   Date for PT Re-Evaluation 02/13/16   PT Start Time 1125   PT Stop Time 1210   PT Time Calculation (min) 45 min   Activity Tolerance Patient tolerated treatment well;No increased pain   Behavior During Therapy [facility] for tasks assessed/performed      Past Medical History  Diagnosis Date  . HPV in female   . Abnormal Pap smear of vagina and vaginal HPV     ascus   . Migraines   . Microscopic hematuria   . Mental disorder     anxiety  . Endometriosis   . 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 GERD (gastroesophageal reflux disease)   . IBS (irritable bowel syndrome)   . Liver disease     H/O hepatitis  . Allergy   . Peptic ulcer disease   . History of migraine   . Skin cancer     left thigh    Past Surgical History  Procedure Laterality Date  . Laparoscopically assisted vaginal hysterectomy  2006  . Tubal ligation    . Wisdom tooth extraction    . Salivary gland surgery    . Abdominal hysterectomy    . Appendectomy    . Cholecystectomy N/A 11/26/2015    Procedure: LAPAROSCOPIC CHOLECYSTECTOMY ;  Surgeon: Jules Husbands, MD;  Location: [facility] ORS;  Service: General;  Laterality: N/A;    There were no vitals filed for this visit.      Subjective Assessment - 02/03/16 1128    Subjective Patient reports having headache over the weekend ( last 3 days); She reports having difficulty resting and its just painful.    Pertinent History Personal factors affecting rehab: anxiety/depression, fear of injuring right ankle, current fracture in ankle, severe  pain, decreased balance and high fall risk, decreased caregiver support at home;    How long can you sit comfortably? feels tingling/burning;    How long can you stand comfortably? 15 min   How long can you walk comfortably? never comfortable with walking; increased pain with all walking;    Diagnostic tests MRI in September of right ankle and was diagnosed with ankle fracture; her last x-ray shows that it is still broken but is healing from the outside in;    Currently in Pain? Yes   Pain Score 5    Pain Location Foot   Pain Orientation Right   Pain Descriptors / Indicators Burning   Pain Type Chronic pain   Pain Onset More than a month ago        TREATMENT: Warm up on recumbent bike forward/backward x4 min (unbilled)  Leg press, BLE 105# x12; Patient slow with leg press machine due to weakness and pain;  Leg press, RLE only 60# with dynadisc 2x10; Exhibits increased muscle fatigue with fasciculation; Did require min VCs to slow down eccentric return for better strengthening;  Leg press, RLE only 60# heel raise without dynadisc 2x12 with cues to relax foot into ankle DF for Better flexibility and strengthening;   Long sitting: RLE  ankle DF, IV/EV x15 each direction with cues to increase ROM for better strengthening;   Finished with interferential TENs, at tolerated intensity x15 min, concurrent with moist heat; Patient reports less burning after treatment session;                                 PT Education - 02/03/16 1157    Education provided Yes   Education Details LE strengthening, TENs   Person(s) Educated Patient   Methods Explanation;Verbal cues   Comprehension Verbalized understanding;Returned demonstration;Verbal cues required             PT Long Term Goals - 01/16/16 1042    PT LONG TERM GOAL #1   Title Patient will be independent in home exercise program to improve strength/mobility for better functional independence with ADLs by  02/13/16   Time 8   Period Weeks   Status On-going   PT LONG TERM GOAL #2   Title Patient (50 years old) will complete five times sit to stand test in < 10 seconds indicating an increased LE strength and improved balance by 02/13/16   Time 8   Period Weeks   Status Partially Met   PT LONG TERM GOAL #3   Title Patient will increase 10 meter walk test to >1.16ms as to improve gait speed for better community ambulation and to reduce fall risk. by 02/13/16   Time 8   Period Weeks   Status Partially Met   PT LONG TERM GOAL #4   Title Patient will ascend/descend 4 stairs with 1 rail assist independently forward reciprocally without loss of balance to improve ability to get in/out of home. by 02/13/16   Time 8   Period Weeks   Status Achieved   PT LONG TERM GOAL #5   Title Patient will report a worst pain of 3/10 on VAS in  right ankle           to improve tolerance with ADLs and reduced symptoms with activities. by 02/13/16   Time 8   Period Weeks   Status Not Met   PT LONG TERM GOAL #6   Title Patient will increase lower extremity functional scale to >60/80 to demonstrate improved functional mobility and increased tolerance with ADLs. by 02/13/16   Time 8   Period Weeks   Status Partially Met   PT LONG TERM GOAL #7   Title Patient will increase RLE ankle AROM DF: >5 degrees, PF: >60 degrees, IV: >15 degrees, EV: >15 degrees for increased functional ROM to improve foot clearance with gait tasks by 02/13/16   Time 4   Period Weeks   Status Partially Met   PT LONG TERM GOAL #8   Title Patient will increase BLE gross strength to 4+/5 as to improve functional strength for independent gait, increased standing tolerance and increased ADL ability. by 02/13/16   Time 8   Period Weeks   Status Partially Met               Plan - 02/03/16 1158    Clinical Impression Statement Instructed patient in LE strengthening; Patient required mod VCs to increase ROM for better strengthening;  Patient continues to have pain with ankle movement. She is very slow with ankle movement due to weakness and fear of pain. She responds well to TENs with less pain after treatment session; She would benefit from additional skilled PT intervention to improve  LE strength, reduce pain and improve mobility;    Rehab Potential Fair   Clinical Impairments Affecting Rehab Potential positive: motivation to get better, negative: chronic condition, anxious, co-morbidities; Clinical presentation is unstable as her pain comes and goes with no pattern, severity of pain limits mobility, uncontrolled anxiety   PT Frequency 2x / week   PT Duration 6 weeks   PT Treatment/Interventions Cryotherapy;Moist Heat;Balance training;Therapeutic exercise;Therapeutic activities;Functional mobility training;Stair training;Gait training;ADLs/Self Care Home Management;Neuromuscular re-education;Patient/family education;Manual techniques;Taping;Energy conservation;Dry needling;Passive range of motion;Electrical Stimulation;Ultrasound   PT Next Visit Plan work on strengthening, balance/TENs   PT Home Exercise Plan continue as previously given   Consulted and Agree with Plan of Care Patient      Patient will benefit from skilled therapeutic intervention in order to improve the following deficits and impairments:  Abnormal gait, Decreased endurance, Impaired sensation, Decreased activity tolerance, Decreased strength, Pain, Decreased balance, Decreased mobility, Difficulty walking, Decreased range of motion, Decreased safety awareness, Impaired flexibility  Visit Diagnosis: Right ankle pain  Difficulty walking  Weakness     Problem List Patient Active Problem List   Diagnosis Date Noted  . Malleolar fracture 12/10/2015  . Complex regional pain syndrome of lower extremity 12/10/2015  . Hx MRSA infection 06/18/2015  . Abnormal chest x-ray 05/29/2015  . Family history of colon cancer 02/21/2013  . TMJ (dislocation of  temporomandibular joint) 02/21/2013  . Tobacco abuse 02/21/2013  . Allergy   . Skin cancer   . Peptic ulcer disease   . Generalized anxiety disorder   . Herpes simplex without mention of complication   . IBS (irritable bowel syndrome)   . History of migraine     Trotter,Margaret PT, DPT 02/03/2016, 12:11 PM  [place] MAIN [facility] SERVICES [address] [place], [place], [address] Phone: [phone]   Fax:  [phone]  Name: REVECCA NACHTIGAL MRN: [id number] Date of Birth: 08/21/1966

## 2016-02-06 ENCOUNTER — Ambulatory Visit: Payer: Self-pay | Admitting: Physical Therapy

## 2016-02-06 ENCOUNTER — Encounter: Payer: Self-pay | Admitting: Physical Therapy

## 2016-02-06 DIAGNOSIS — R262 Difficulty in walking, not elsewhere classified: Secondary | ICD-10-CM

## 2016-02-06 DIAGNOSIS — M6281 Muscle weakness (generalized): Secondary | ICD-10-CM

## 2016-02-06 DIAGNOSIS — M25571 Pain in right ankle and joints of right foot: Secondary | ICD-10-CM

## 2016-02-06 NOTE — Therapy (Signed)
[place] MAIN [facility] SERVICES [address] [place], [place], [address] Phone: [phone]   Fax:  [phone]  Physical Therapy Treatment  Patient Details  Name: Danielle Strickland MRN: [id number] Date of Birth: [birth year] Referring Provider: Wylene Simmer MD  Encounter Date: 02/06/2016      PT End of Session - 02/06/16 1055    Visit Number 22   Number of Visits 35   Date for PT Re-Evaluation 03/05/16   PT Start Time 9518   PT Stop Time 1130   PT Time Calculation (min) 55 min   Activity Tolerance Patient tolerated treatment well;No increased pain   Behavior During Therapy [facility] for tasks assessed/performed      Past Medical History  Diagnosis Date  . HPV in female   . Abnormal Pap smear of vagina and vaginal HPV     ascus   . Migraines   . Microscopic hematuria   . Mental disorder     anxiety  . Endometriosis   . 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 GERD (gastroesophageal reflux disease)   . IBS (irritable bowel syndrome)   . Liver disease     H/O hepatitis  . Allergy   . Peptic ulcer disease   . History of migraine   . Skin cancer     left thigh    Past Surgical History  Procedure Laterality Date  . Laparoscopically assisted vaginal hysterectomy  2006  . Tubal ligation    . Wisdom tooth extraction    . Salivary gland surgery    . Abdominal hysterectomy    . Appendectomy    . Cholecystectomy N/A 11/26/2015    Procedure: LAPAROSCOPIC CHOLECYSTECTOMY ;  Surgeon: Jules Husbands, MD;  Location: [facility] ORS;  Service: General;  Laterality: N/A;    There were no vitals filed for this visit.      Subjective Assessment - 02/06/16 1048    Subjective Patient reports still having a headache; She reports that she is going to walk downtown tomorrow and has been able to talk with more about about church; Overall she has been getting out of her apartment more and doing more walking;    Pertinent History Personal  factors affecting rehab: anxiety/depression, fear of injuring right ankle, current fracture in ankle, severe pain, decreased balance and high fall risk, decreased caregiver support at home;    How long can you sit comfortably? feels tingling/burning;    How long can you stand comfortably? 15 min   How long can you walk comfortably? never comfortable with walking; increased pain with all walking;    Diagnostic tests MRI in September of right ankle and was diagnosed with ankle fracture; her last x-ray shows that it is still broken but is healing from the outside in;    Currently in Pain? Yes   Pain Score 3    Pain Location Ankle   Pain Orientation Right   Pain Descriptors / Indicators Burning   Pain Type Chronic pain   Pain Radiating Towards pain is radiating up medial lower leg which is more than usual;    Pain Onset More than a month ago   Pain Frequency Constant            [facility] PT Assessment - 02/06/16 0001    Observation/Other Assessments   Lower Extremity Functional Scale  34/80 (the lower the score the greater the disability; improved from 01/16/16 which was 26/80)  AROM   Right Ankle Dorsiflexion 3  improved from -2 degrees; still not functional for safe gait   Ambulation/Gait   Gait Comments ambulates with reciprocal gait pattern; still slower than age group norms with decreased right ankle DF/PF during swing and stance phase;    Standardized Balance Assessment   Five times sit to stand comments  19 sec without HHA (>10 indicates increased risk for falls, improved form 01/16/16 which was 22 sec)   10 Meter Walk 1.02 m/s without AD, community ambulator; lower fall risk; improved from 01/16/16 which was0.83 m/s        TREATMENT: Warm up on elliptical x3 min with 2 min forward, 1 min backward with min VCs to increase ankle ROM for better flexibility and strengthening;  Leg press: Leg press, RLE only 60# with dynadisc x15; Exhibits increased muscle fatigue with fasciculation;  Did require min VCs to slow down eccentric return for better strengthening;  Leg press, RLE only 60# heel raise without dynadisc x15 with cues to relax foot into ankle DF for Better flexibility and strengthening;   Forward step on big tire (approximately 10+ inches) x1 each LE with 1 HHA for balance;  Diagonal steps over narrow beam with red tband around ankles x1 lap forward/backward with min VCs to increase step length for better foot clearance; Side step with squat and BUE ball chest press on narrow beam x5 reps with cues to slow down LE movement and increase step length for better balance control;  Forward step lunge on narrow beam with BUE trunk rotation side/side x3 each foot in front;  Squat jumps x5 reps; Patient had severe pain in right ankle with jumping; She was hesitant to perform jumps due to fear and pain;   PT assessed patient's progress towards goals with 10 meter walk, 5 times sit<>Stand and ankle ROM, see above;   Finished with interferential TENs, to right ankle, at tolerated intensity x15 min concurrent with moist heat; Patient reports no burning after TENs treatment. She is hoping to get home TENs unit next week;                        PT Education - 02/06/16 1158    Education provided Yes   Education Details LE strengthening, TENs,    Person(s) Educated Patient   Methods Explanation;Verbal cues   Comprehension Verbalized understanding;Returned demonstration;Verbal cues required             PT Long Term Goals - 02/06/16 1057    PT LONG TERM GOAL #1   Title Patient will be independent in home exercise program to improve strength/mobility for better functional independence with ADLs by 03/05/16   Time 8   Period Weeks   Status On-going   PT LONG TERM GOAL #2   Title Patient (< 25 years old) will complete five times sit to stand test in < 10 seconds indicating an increased LE strength and improved balance by 03/05/16   Time 8   Period Weeks    Status Partially Met   PT LONG TERM GOAL #3   Title Patient will increase 10 meter walk test to >1.94ms as to improve gait speed for better community ambulation and to reduce fall risk. by 03/05/16   Time 8   Period Weeks   Status Achieved   PT LONG TERM GOAL #4   Title Patient will ascend/descend 4 stairs with 1 rail assist independently forward reciprocally without loss of balance to  improve ability to get in/out of home. by 03/05/16   Time 8   Period Weeks   Status Achieved   PT LONG TERM GOAL #5   Title Patient will report a worst pain of 3/10 on VAS in  right ankle           to improve tolerance with ADLs and reduced symptoms with activities. by 03/05/16   Time 8   Period Weeks   Status Not Met   PT LONG TERM GOAL #6   Title Patient will increase lower extremity functional scale to >60/80 to demonstrate improved functional mobility and increased tolerance with ADLs. by 03/05/16   Time 8   Period Weeks   Status Partially Met   PT LONG TERM GOAL #7   Title Patient will increase RLE ankle AROM DF: >5 degrees, PF: >60 degrees, IV: >15 degrees, EV: >15 degrees for increased functional ROM to improve foot clearance with gait tasks by 03/05/16   Time 4   Period Weeks   Status Partially Met   PT LONG TERM GOAL #8   Title Patient will increase BLE gross strength to 4+/5 as to improve functional strength for independent gait, increased standing tolerance and increased ADL ability. by 03/05/16   Time 8   Period Weeks   Status Partially Met               Plan - 02/06/16 1158    Clinical Impression Statement Instructed patient in advanced LE strengthening; Patient demonstrates slight improvement in right ankle DF, but is still limited by pain. She is walking better but continues to have decreased ankle mobility during gait tasks. Patient has increased difficulty with dynamic gait tasks on uneven surface especially with narrow base of support. She would benefit from additional  skilled PT intervention to reduce ankle pain and return to PLOF. Patient is supposed to get home TENs unit next week hopefully which will help with pain control;    Rehab Potential Fair   Clinical Impairments Affecting Rehab Potential positive: motivation to get better, negative: chronic condition, anxious, co-morbidities; Clinical presentation is unstable as her pain comes and goes with no pattern, severity of pain limits mobility, uncontrolled anxiety   PT Frequency 2x / week   PT Duration 4 weeks   PT Treatment/Interventions Cryotherapy;Moist Heat;Balance training;Therapeutic exercise;Therapeutic activities;Functional mobility training;Stair training;Gait training;ADLs/Self Care Home Management;Neuromuscular re-education;Patient/family education;Manual techniques;Taping;Energy conservation;Dry needling;Passive range of motion;Electrical Stimulation;Ultrasound   PT Next Visit Plan work on strengthening, balance/TENs   PT Home Exercise Plan continue as previously given   Consulted and Agree with Plan of Care Patient      Patient will benefit from skilled therapeutic intervention in order to improve the following deficits and impairments:  Abnormal gait, Decreased endurance, Impaired sensation, Decreased activity tolerance, Decreased strength, Pain, Decreased balance, Decreased mobility, Difficulty walking, Decreased range of motion, Decreased safety awareness, Impaired flexibility  Visit Diagnosis: Right ankle pain - Plan: PT plan of care cert/re-cert  Difficulty in walking, not elsewhere classified - Plan: PT plan of care cert/re-cert  Muscle weakness (generalized) - Plan: PT plan of care cert/re-cert     Problem List Patient Active Problem List   Diagnosis Date Noted  . Malleolar fracture 12/10/2015  . Complex regional pain syndrome of lower extremity 12/10/2015  . Hx MRSA infection 06/18/2015  . Abnormal chest x-ray 05/29/2015  . Family history of colon cancer 02/21/2013  . TMJ  (dislocation of temporomandibular joint) 02/21/2013  . Tobacco abuse 02/21/2013  . Allergy   .  Skin cancer   . Peptic ulcer disease   . Generalized anxiety disorder   . Herpes simplex without mention of complication   . IBS (irritable bowel syndrome)   . History of migraine     Danielle Strickland PT, DPT 02/06/2016, 12:44 PM  [place] MAIN [facility] SERVICES [address] [place], [place], [address] Phone: [phone]   Fax:  [phone]  Name: Danielle Strickland MRN: [id number] Date of Birth: [birth year]

## 2016-02-11 ENCOUNTER — Ambulatory Visit: Payer: Self-pay | Admitting: Physical Therapy

## 2016-02-12 ENCOUNTER — Encounter: Payer: Self-pay | Admitting: Physical Therapy

## 2016-02-13 ENCOUNTER — Ambulatory Visit: Payer: Self-pay | Admitting: Physical Therapy

## 2016-02-13 ENCOUNTER — Encounter: Payer: Self-pay | Admitting: Physical Therapy

## 2016-02-13 DIAGNOSIS — M6281 Muscle weakness (generalized): Secondary | ICD-10-CM

## 2016-02-13 DIAGNOSIS — M25571 Pain in right ankle and joints of right foot: Secondary | ICD-10-CM

## 2016-02-13 DIAGNOSIS — R262 Difficulty in walking, not elsewhere classified: Secondary | ICD-10-CM

## 2016-02-13 NOTE — Therapy (Signed)
[place] MAIN [facility] SERVICES [address] [place], [place], [address] Phone: [phone]   Fax:  [phone]  Physical Therapy Treatment/Progress Note  Patient Details  Name: Danielle Strickland MRN: [id number] Date of Birth: 1966-07-11 Referring Provider: Wylene Simmer MD  Encounter Date: 02/13/2016      PT End of Session - 02/13/16 1050    Visit Number 23   Number of Visits 35   Date for PT Re-Evaluation 03/05/16   PT Start Time 1033   PT Stop Time 1115   PT Time Calculation (min) 42 min   Activity Tolerance Patient tolerated treatment well;No increased pain   Behavior During Therapy [facility] for tasks assessed/performed      Past Medical History  Diagnosis Date  . HPV in female   . Abnormal Pap smear of vagina and vaginal HPV     ascus   . Migraines   . Microscopic hematuria   . Mental disorder     anxiety  . Endometriosis   . 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 GERD (gastroesophageal reflux disease)   . IBS (irritable bowel syndrome)   . Liver disease     H/O hepatitis  . Allergy   . Peptic ulcer disease   . History of migraine   . Skin cancer     left thigh    Past Surgical History  Procedure Laterality Date  . Laparoscopically assisted vaginal hysterectomy  2006  . Tubal ligation    . Wisdom tooth extraction    . Salivary gland surgery    . Abdominal hysterectomy    . Appendectomy    . Cholecystectomy N/A 11/26/2015    Procedure: LAPAROSCOPIC CHOLECYSTECTOMY ;  Surgeon: Jules Husbands, MD;  Location: [facility] ORS;  Service: General;  Laterality: N/A;    There were no vitals filed for this visit.      Subjective Assessment - 02/13/16 1049    Subjective Patient reports continued right ankle pain; "I have come to the conclusion that I am going to have pain forever" Patient reports that she was able to walk 2 blocks with her grandson; She did require increased rest breaks while walking;    Pertinent  History Personal factors affecting rehab: anxiety/depression, fear of injuring right ankle, current fracture in ankle, severe pain, decreased balance and high fall risk, decreased caregiver support at home;    How long can you sit comfortably? feels tingling/burning;    How long can you stand comfortably? 15 min   How long can you walk comfortably? never comfortable with walking; increased pain with all walking;    Diagnostic tests MRI in September of right ankle and was diagnosed with ankle fracture; her last x-ray shows that it is still broken but is healing from the outside in;    Currently in Pain? Yes   Pain Score 4    Pain Location Ankle   Pain Orientation Right   Pain Descriptors / Indicators Burning   Pain Type Chronic pain   Pain Onset More than a month ago            [facility] PT Assessment - 02/13/16 0001    Observation/Other Assessments   Lower Extremity Functional Scale  34/80 (the lower the score the greater the disability; improved from 01/16/16 which was 26/80)  as of 02/06/16   AROM   Right Ankle Dorsiflexion 2  continues to have pain but able to actively pull past  neutra   Standardized Balance Assessment   Five times sit to stand comments  18.7 sec without HHA (>10 sec indicates increased fall risk, improved from 02/06/16 which was 19 sec)   10 Meter Walk 1.02 m/s on 02/06/16      TREATMENT:  Warm up on Spin bike, 1 min forward, 1 min backward; then standing with full revolution x 10 revolutions; Patient able to demonstrate better tolerance with spin bike as compared to previous session;  Side stepping on narrow beam, unsupported, with mini squat x2 laps; Tandem gait unsupported with head turns x1 lap; Forward lunges unsupported with BUE ball trunk rotation x2 laps;  PT instructed patient in 5 times sit<>Stand; see above; assessed ankle ROM; Patient continues to be limited with ankle DF due to ankle pain;  Finished with interferential TENs, at tolerated intensity x15  min concurrent with moist heat; Patient reports no pain after treatment session; She reports that she is hoping to get her home TENs unit next week;                         PT Education - 02/13/16 1317    Education provided Yes   Education Details balance exercise, ankle ROM   Person(s) Educated Patient   Methods Explanation;Verbal cues   Comprehension Verbalized understanding;Returned demonstration;Verbal cues required             PT Long Term Goals - 02/13/16 1050    PT LONG TERM GOAL #1   Title Patient will be independent in home exercise program to improve strength/mobility for better functional independence with ADLs by 03/05/16   Time 8   Period Weeks   Status On-going   PT LONG TERM GOAL #2   Title Patient (< 52 years old) will complete five times sit to stand test in < 10 seconds indicating an increased LE strength and improved balance by 03/05/16   Time 8   Period Weeks   Status Partially Met   PT LONG TERM GOAL #3   Title Patient will increase 10 meter walk test to >1.108ms as to improve gait speed for better community ambulation and to reduce fall risk. by 03/05/16   Time 8   Period Weeks   Status Achieved   PT LONG TERM GOAL #4   Title Patient will ascend/descend 4 stairs with 1 rail assist independently forward reciprocally without loss of balance to improve ability to get in/out of home. by 03/05/16   Time 8   Period Weeks   Status Achieved   PT LONG TERM GOAL #5   Title Patient will report a worst pain of 3/10 on VAS in  right ankle           to improve tolerance with ADLs and reduced symptoms with activities. by 03/05/16   Time 8   Period Weeks   Status Not Met   PT LONG TERM GOAL #6   Title Patient will increase lower extremity functional scale to >60/80 to demonstrate improved functional mobility and increased tolerance with ADLs. by 03/05/16   Time 8   Period Weeks   Status Partially Met   PT LONG TERM GOAL #7   Title Patient will  increase RLE ankle AROM DF: >5 degrees, PF: >60 degrees, IV: >15 degrees, EV: >15 degrees for increased functional ROM to improve foot clearance with gait tasks by 03/05/16   Time 4   Period Weeks   Status Partially Met   PT  LONG TERM GOAL #8   Title Patient will increase BLE gross strength to 4+/5 as to improve functional strength for independent gait, increased standing tolerance and increased ADL ability. by 03/05/16   Time 8   Period Weeks   Status Partially Met               Plan - 02/13/16 1317    Clinical Impression Statement Instructed patient in advanced ankle stability, balance exercise. Patient has increased ankle burning with increased weight shift on RLE; She required close supervision with advanced exercise but was able to perform them with less rail assist. Patient would benefit from additional skilled PT intervention to improve balance/gait safety and reduce ankle pain; She is hoping to get a home TENs unit to help with pain management at night.    Rehab Potential Fair   Clinical Impairments Affecting Rehab Potential positive: motivation to get better, negative: chronic condition, anxious, co-morbidities; Clinical presentation is unstable as her pain comes and goes with no pattern, severity of pain limits mobility, uncontrolled anxiety   PT Frequency 2x / week   PT Duration 6 weeks   PT Treatment/Interventions Cryotherapy;Moist Heat;Balance training;Therapeutic exercise;Therapeutic activities;Functional mobility training;Stair training;Gait training;ADLs/Self Care Home Management;Neuromuscular re-education;Patient/family education;Manual techniques;Taping;Energy conservation;Dry needling;Passive range of motion;Electrical Stimulation;Ultrasound   PT Next Visit Plan work on strengthening, balance/TENs   PT Home Exercise Plan continue as previously given   Consulted and Agree with Plan of Care Patient      Patient will benefit from skilled therapeutic intervention in  order to improve the following deficits and impairments:  Abnormal gait, Decreased endurance, Impaired sensation, Decreased activity tolerance, Decreased strength, Pain, Decreased balance, Decreased mobility, Difficulty walking, Decreased range of motion, Decreased safety awareness, Impaired flexibility  Visit Diagnosis: Right ankle pain  Difficulty in walking, not elsewhere classified  Muscle weakness (generalized)     Problem List Patient Active Problem List   Diagnosis Date Noted  . Malleolar fracture 12/10/2015  . Complex regional pain syndrome of lower extremity 12/10/2015  . Hx MRSA infection 06/18/2015  . Abnormal chest x-ray 05/29/2015  . Family history of colon cancer 02/21/2013  . TMJ (dislocation of temporomandibular joint) 02/21/2013  . Tobacco abuse 02/21/2013  . Allergy   . Skin cancer   . Peptic ulcer disease   . Generalized anxiety disorder   . Herpes simplex without mention of complication   . IBS (irritable bowel syndrome)   . History of migraine     Kerrick Miler PT, DPT 02/13/2016, 1:19 PM  [place] MAIN [facility] SERVICES [address] [place], [place], [address] Phone: [phone]   Fax:  [phone]  Name: Danielle Strickland MRN: [id number] Date of Birth: 29-Feb-1966

## 2016-02-19 ENCOUNTER — Ambulatory Visit: Payer: Self-pay | Admitting: Physical Therapy

## 2016-02-19 ENCOUNTER — Ambulatory Visit: Payer: Self-pay | Admitting: Family Medicine

## 2016-02-19 ENCOUNTER — Telehealth: Payer: Self-pay | Admitting: Family Medicine

## 2016-02-19 ENCOUNTER — Encounter: Payer: Self-pay | Admitting: Physical Therapy

## 2016-02-19 DIAGNOSIS — M25571 Pain in right ankle and joints of right foot: Secondary | ICD-10-CM

## 2016-02-19 DIAGNOSIS — M6281 Muscle weakness (generalized): Secondary | ICD-10-CM

## 2016-02-19 DIAGNOSIS — R262 Difficulty in walking, not elsewhere classified: Secondary | ICD-10-CM

## 2016-02-19 NOTE — Patient Instructions (Addendum)
  Calf Stretch    Place hands on wall at shoulder height. Keeping back leg straight, bend front leg, feet pointing forward, heels flat on floor. Lean forward slightly until stretch is felt in calf of back leg. Hold stretch _15__ seconds, breathing slowly in and out. Repeat stretch with other leg back. Do _2-3__ sessions per day. Variation: Use chair or table for support.   Copyright  VHI. All rights reserved.  ANKLE: Dorsiflexion, Step Unilateral   Stand on step, hang one heel off back of step. Hold _20__ seconds. __3_ reps per set, __2_ sets per day, _5_ days per week Hold onto a support.  Copyright  VHI. All rights reserved.

## 2016-02-19 NOTE — Telephone Encounter (Signed)
Danielle Strickland notified the letter for short term disability she requested is ready to be picked up at the front desk.

## 2016-02-19 NOTE — Telephone Encounter (Signed)
Pt r/s appointment to Monday She would like donna to call her she needs a note for short term disability Can you do same note as march 6   Please advise when ready for pick She stated she needs note today or short term disability will stop

## 2016-02-19 NOTE — Therapy (Signed)
[place] MAIN [facility] SERVICES [address] [place], [place], [address] Phone: [phone]   Fax:  [phone]  Physical Therapy Treatment  Patient Details  Name: Danielle Strickland MRN: [id number] Date of Birth: 04-Jun-1966 Referring Provider: Wylene Simmer MD  Encounter Date: 02/19/2016      PT End of Session - 02/19/16 1339    Visit Number 24   Number of Visits 35   Date for PT Re-Evaluation 03/05/16   PT Start Time 1115   PT Stop Time 1210   PT Time Calculation (min) 55 min   Activity Tolerance Patient tolerated treatment well;Patient limited by pain   Behavior During Therapy [facility] for tasks assessed/performed      Past Medical History  Diagnosis Date  . HPV in female   . Abnormal Pap smear of vagina and vaginal HPV     ascus   . Migraines   . Microscopic hematuria   . Mental disorder     anxiety  . Endometriosis   . 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 GERD (gastroesophageal reflux disease)   . IBS (irritable bowel syndrome)   . Liver disease     H/O hepatitis  . Allergy   . Peptic ulcer disease   . History of migraine   . Skin cancer     left thigh    Past Surgical History  Procedure Laterality Date  . Laparoscopically assisted vaginal hysterectomy  2006  . Tubal ligation    . Wisdom tooth extraction    . Salivary gland surgery    . Abdominal hysterectomy    . Appendectomy    . Cholecystectomy N/A 11/26/2015    Procedure: LAPAROSCOPIC CHOLECYSTECTOMY ;  Surgeon: Jules Husbands, MD;  Location: [facility] ORS;  Service: General;  Laterality: N/A;    There were no vitals filed for this visit.      Subjective Assessment - 02/19/16 1131    Subjective Patient reports increased burning in right ankle over the weekend due to the wet weather; Patient reports that she feels that her ankle is more swollen today;    Pertinent History Personal factors affecting rehab: anxiety/depression, fear of injuring right  ankle, current fracture in ankle, severe pain, decreased balance and high fall risk, decreased caregiver support at home;    How long can you sit comfortably? feels tingling/burning;    How long can you stand comfortably? 15 min   How long can you walk comfortably? never comfortable with walking; increased pain with all walking;    Diagnostic tests MRI in September of right ankle and was diagnosed with ankle fracture; her last x-ray shows that it is still broken but is healing from the outside in;    Currently in Pain? Yes   Pain Score 6    Pain Location Ankle   Pain Orientation Right   Pain Descriptors / Indicators Burning   Pain Type Chronic pain   Pain Onset More than a month ago          TREATMENT:  Warm up on Spin bike, 1 min forward, 1 min backward; then standing with full revolution x 2 min; Patient able to demonstrate better tolerance with spin bike as compared to previous session; Standing elliptical x2 min on level 1 with cues to increase speed for better mobility;  Standing heel off step stretch 15 sec hold x2 bilaterally; Standing calf stretch into wall 15 sec hold x1 bilaterally;  Leg  press: Leg press, RLE only 60# with dynadisc 2x12; Exhibits increased muscle fatigue with fasciculation; Did require min VCs to slow down eccentric return for better strengthening;  Leg press, RLE only 60# heel raise without dynadisc x15 with cues to relax foot into ankle DF for Better flexibility and strengthening;   PT performed PROM of ankle DF x5 reps; Grade II-III AP/PA mobs, 10 sec bouts x3 each to right ankle joint; PROM of RLE ankle into DF 15 sec hold x5 RLE ankle AROM DF 0 degrees with increased ankle ankle pain;   Finished with interferential TENs, at tolerated intensity x15 min concurrent with moist heat; Patient reports no pain after treatment session; She reports that she is hoping to get her home TENs unit next week; She is waiting on her insurance  settlement;                              PT Education - 02/19/16 1338    Education provided Yes   Education Details strengthening, manual therapy, ankle ROM   Person(s) Educated Patient   Methods Explanation;Verbal cues   Comprehension Verbalized understanding;Returned demonstration;Verbal cues required             PT Long Term Goals - 02/13/16 1050    PT LONG TERM GOAL #1   Title Patient will be independent in home exercise program to improve strength/mobility for better functional independence with ADLs by 03/05/16   Time 8   Period Weeks   Status On-going   PT LONG TERM GOAL #2   Title Patient (< 17 years old) will complete five times sit to stand test in < 10 seconds indicating an increased LE strength and improved balance by 03/05/16   Time 8   Period Weeks   Status Partially Met   PT LONG TERM GOAL #3   Title Patient will increase 10 meter walk test to >1.3ms as to improve gait speed for better community ambulation and to reduce fall risk. by 03/05/16   Time 8   Period Weeks   Status Achieved   PT LONG TERM GOAL #4   Title Patient will ascend/descend 4 stairs with 1 rail assist independently forward reciprocally without loss of balance to improve ability to get in/out of home. by 03/05/16   Time 8   Period Weeks   Status Achieved   PT LONG TERM GOAL #5   Title Patient will report a worst pain of 3/10 on VAS in  right ankle           to improve tolerance with ADLs and reduced symptoms with activities. by 03/05/16   Time 8   Period Weeks   Status Not Met   PT LONG TERM GOAL #6   Title Patient will increase lower extremity functional scale to >60/80 to demonstrate improved functional mobility and increased tolerance with ADLs. by 03/05/16   Time 8   Period Weeks   Status Partially Met   PT LONG TERM GOAL #7   Title Patient will increase RLE ankle AROM DF: >5 degrees, PF: >60 degrees, IV: >15 degrees, EV: >15 degrees for increased  functional ROM to improve foot clearance with gait tasks by 03/05/16   Time 4   Period Weeks   Status Partially Met   PT LONG TERM GOAL #8   Title Patient will increase BLE gross strength to 4+/5 as to improve functional strength for independent gait, increased standing tolerance and  increased ADL ability. by 03/05/16   Time 8   Period Weeks   Status Partially Met               Plan - 02/19/16 1345    Clinical Impression Statement Patient continues to have increased right ankle pain with advanced exercise. She was able to tolerate increased weight bearing with elliptical/standing on spin bike with less discomfort. Patient also instructed in advanced strengthening with right ankle. PT performed increased manual therapy to facilitate improved ankle DF. She continues to be limited with ankle ROM particularly with DF. Advanced HEP with stretches to improve flexibility; Patient would benefit from additional skilled PT intervention to reduce ankle pain and improve mobility;    Rehab Potential Fair   Clinical Impairments Affecting Rehab Potential positive: motivation to get better, negative: chronic condition, anxious, co-morbidities; Clinical presentation is unstable as her pain comes and goes with no pattern, severity of pain limits mobility, uncontrolled anxiety   PT Frequency 2x / week   PT Duration 6 weeks   PT Treatment/Interventions Cryotherapy;Moist Heat;Balance training;Therapeutic exercise;Therapeutic activities;Functional mobility training;Stair training;Gait training;ADLs/Self Care Home Management;Neuromuscular re-education;Patient/family education;Manual techniques;Taping;Energy conservation;Dry needling;Passive range of motion;Electrical Stimulation;Ultrasound   PT Next Visit Plan work on strengthening, balance/TENs   PT Home Exercise Plan advanced- see patient instructions;    Consulted and Agree with Plan of Care Patient      Patient will benefit from skilled therapeutic  intervention in order to improve the following deficits and impairments:  Abnormal gait, Decreased endurance, Impaired sensation, Decreased activity tolerance, Decreased strength, Pain, Decreased balance, Decreased mobility, Difficulty walking, Decreased range of motion, Decreased safety awareness, Impaired flexibility  Visit Diagnosis: Right ankle pain  Difficulty in walking, not elsewhere classified  Muscle weakness (generalized)     Problem List Patient Active Problem List   Diagnosis Date Noted  . Malleolar fracture 12/10/2015  . Complex regional pain syndrome of lower extremity 12/10/2015  . Hx MRSA infection 06/18/2015  . Abnormal chest x-ray 05/29/2015  . Family history of colon cancer 02/21/2013  . TMJ (dislocation of temporomandibular joint) 02/21/2013  . Tobacco abuse 02/21/2013  . Allergy   . Skin cancer   . Peptic ulcer disease   . Generalized anxiety disorder   . Herpes simplex without mention of complication   . IBS (irritable bowel syndrome)   . History of migraine     Trotter,Margaret PT, DPT 02/19/2016, 1:53 PM  [place] MAIN [facility] SERVICES [address] [place], [place], [address] Phone: [phone]   Fax:  [phone]  Name: INIOLUWA BOULAY MRN: [id number] Date of Birth: [birth year]

## 2016-02-21 ENCOUNTER — Encounter: Payer: Self-pay | Admitting: Physical Therapy

## 2016-02-21 ENCOUNTER — Ambulatory Visit: Payer: Self-pay | Admitting: Physical Therapy

## 2016-02-21 DIAGNOSIS — M25571 Pain in right ankle and joints of right foot: Secondary | ICD-10-CM

## 2016-02-21 DIAGNOSIS — R262 Difficulty in walking, not elsewhere classified: Secondary | ICD-10-CM

## 2016-02-21 DIAGNOSIS — M6281 Muscle weakness (generalized): Secondary | ICD-10-CM

## 2016-02-21 NOTE — Therapy (Signed)
[place] MAIN [facility] SERVICES [address] [place], [place], [address] Phone: [phone]   Fax:  [phone]  Physical Therapy Treatment  Patient Details  Name: Danielle Strickland MRN: [id number] Date of Birth: [birth year] Referring Provider: Wylene Simmer MD  Encounter Date: 02/21/2016      PT End of Session - 02/21/16 1105    Visit Number 25   Number of Visits 35   Date for PT Re-Evaluation 03/05/16   PT Start Time 1028   PT Stop Time 1115   PT Time Calculation (min) 47 min   Activity Tolerance Patient tolerated treatment well;Patient limited by pain   Behavior During Therapy [facility] for tasks assessed/performed      Past Medical History  Diagnosis Date  . HPV in female   . Abnormal Pap smear of vagina and vaginal HPV     ascus   . Migraines   . Microscopic hematuria   . Mental disorder     anxiety  . Endometriosis   . 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 GERD (gastroesophageal reflux disease)   . IBS (irritable bowel syndrome)   . Liver disease     H/O hepatitis  . Allergy   . Peptic ulcer disease   . History of migraine   . Skin cancer     left thigh    Past Surgical History  Procedure Laterality Date  . Laparoscopically assisted vaginal hysterectomy  2006  . Tubal ligation    . Wisdom tooth extraction    . Salivary gland surgery    . Abdominal hysterectomy    . Appendectomy    . Cholecystectomy N/A 11/26/2015    Procedure: LAPAROSCOPIC CHOLECYSTECTOMY ;  Surgeon: Jules Husbands, MD;  Location: [facility] ORS;  Service: General;  Laterality: N/A;    There were no vitals filed for this visit.      Subjective Assessment - 02/21/16 1041    Subjective Patient reports doing okay today; She reports having trouble with ants at home and is going to have to work on getting rid of them; She reports continued ankle  burning;    Pertinent History Personal factors affecting rehab: anxiety/depression, fear of  injuring right ankle, current fracture in ankle, severe pain, decreased balance and high fall risk, decreased caregiver support at home;    How long can you sit comfortably? feels tingling/burning;    How long can you stand comfortably? 15 min   How long can you walk comfortably? never comfortable with walking; increased pain with all walking;    Diagnostic tests MRI in September of right ankle and was diagnosed with ankle fracture; her last x-ray shows that it is still broken but is healing from the outside in;    Currently in Pain? Yes   Pain Score 3    Pain Location Ankle   Pain Orientation Right   Pain Descriptors / Indicators Burning   Pain Type Chronic pain   Pain Onset More than a month ago          TREATMENT: Warm up on Elliptical BUE/BLE level 1 x3 min with cues to increase speed for increased cardiovascular and to improve ankle ROM;   Standing on upside down 1/2 bolster (flat side up) heel/toe raises x15 with cues to increase ROM; Standing on upside down 1/2 bolster (flat side up) with BUE wand flexion x10; Incline calf stretch BLE 15 sec hold x2  Rocker board, teeter forward/backward  x2 min with 2-1 rail assist;  Tandem gait on narrow beam x2 lap; Side stepping on narrow beam with mini squat (feet apart) x2 laps  Patient required min VCs for balance stability, including to increase trunk control for less loss of balance with smaller base of support; Patient exhibits decreased right ankle stability with increased muscle fasiculation; Patient reports increased burning in right ankle with prolonged standing/weight shifting towards right leg;   Finished with interferential TENs, at tolerated intensity x15 min concurrent with moist heat; Patient reports no pain after treatment session; She reports that she is hoping to get her home TENs unit next week; She is waiting on her insurance settlement;                          PT Education - 02/21/16 1104     Education provided Yes   Education Details strengthening/balance exercise;    Person(s) Educated Patient   Methods Explanation;Verbal cues   Comprehension Verbalized understanding;Returned demonstration;Verbal cues required             PT Long Term Goals - 02/13/16 1050    PT LONG TERM GOAL #1   Title Patient will be independent in home exercise program to improve strength/mobility for better functional independence with ADLs by 03/05/16   Time 8   Period Weeks   Status On-going   PT LONG TERM GOAL #2   Title Patient (< 80 years old) will complete five times sit to stand test in < 10 seconds indicating an increased LE strength and improved balance by 03/05/16   Time 8   Period Weeks   Status Partially Met   PT LONG TERM GOAL #3   Title Patient will increase 10 meter walk test to >1.40ms as to improve gait speed for better community ambulation and to reduce fall risk. by 03/05/16   Time 8   Period Weeks   Status Achieved   PT LONG TERM GOAL #4   Title Patient will ascend/descend 4 stairs with 1 rail assist independently forward reciprocally without loss of balance to improve ability to get in/out of home. by 03/05/16   Time 8   Period Weeks   Status Achieved   PT LONG TERM GOAL #5   Title Patient will report a worst pain of 3/10 on VAS in  right ankle           to improve tolerance with ADLs and reduced symptoms with activities. by 03/05/16   Time 8   Period Weeks   Status Not Met   PT LONG TERM GOAL #6   Title Patient will increase lower extremity functional scale to >60/80 to demonstrate improved functional mobility and increased tolerance with ADLs. by 03/05/16   Time 8   Period Weeks   Status Partially Met   PT LONG TERM GOAL #7   Title Patient will increase RLE ankle AROM DF: >5 degrees, PF: >60 degrees, IV: >15 degrees, EV: >15 degrees for increased functional ROM to improve foot clearance with gait tasks by 03/05/16   Time 4   Period Weeks   Status Partially Met    PT LONG TERM GOAL #8   Title Patient will increase BLE gross strength to 4+/5 as to improve functional strength for independent gait, increased standing tolerance and increased ADL ability. by 03/05/16   Time 8   Period Weeks   Status Partially Met  Plan - 02/21/16 1105    Clinical Impression Statement Instructed patient in ankle stability exercise; Patient continues to have increased burning in right ankle especially with increased weight bearing on RLE; Patient continues to respond well to TENs with less ankle pain; She would benefit from additional skilled PT intervention to improve ankle ROM, strength and stability;    Rehab Potential Fair   Clinical Impairments Affecting Rehab Potential positive: motivation to get better, negative: chronic condition, anxious, co-morbidities; Clinical presentation is unstable as her pain comes and goes with no pattern, severity of pain limits mobility, uncontrolled anxiety   PT Frequency 2x / week   PT Duration 6 weeks   PT Treatment/Interventions Cryotherapy;Moist Heat;Balance training;Therapeutic exercise;Therapeutic activities;Functional mobility training;Stair training;Gait training;ADLs/Self Care Home Management;Neuromuscular re-education;Patient/family education;Manual techniques;Taping;Energy conservation;Dry needling;Passive range of motion;Electrical Stimulation;Ultrasound   PT Next Visit Plan work on strengthening, balance/TENs   PT Home Exercise Plan continue as given;    Consulted and Agree with Plan of Care Patient      Patient will benefit from skilled therapeutic intervention in order to improve the following deficits and impairments:  Abnormal gait, Decreased endurance, Impaired sensation, Decreased activity tolerance, Decreased strength, Pain, Decreased balance, Decreased mobility, Difficulty walking, Decreased range of motion, Decreased safety awareness, Impaired flexibility  Visit Diagnosis: Right ankle  pain  Difficulty in walking, not elsewhere classified  Muscle weakness (generalized)     Problem List Patient Active Problem List   Diagnosis Date Noted  . Malleolar fracture 12/10/2015  . Complex regional pain syndrome of lower extremity 12/10/2015  . Hx MRSA infection 06/18/2015  . Abnormal chest x-ray 05/29/2015  . Family history of colon cancer 02/21/2013  . TMJ (dislocation of temporomandibular joint) 02/21/2013  . Tobacco abuse 02/21/2013  . Allergy   . Skin cancer   . Peptic ulcer disease   . Generalized anxiety disorder   . Herpes simplex without mention of complication   . IBS (irritable bowel syndrome)   . History of migraine     Trotter,Margaret PT,DPT 02/21/2016, 11:14 AM  [place] MAIN [facility] SERVICES [address] [place], [place], [address] Phone: [phone]   Fax:  [phone]  Name: Danielle Strickland MRN: [id number] Date of Birth: April 06, 1966

## 2016-02-24 ENCOUNTER — Ambulatory Visit (INDEPENDENT_AMBULATORY_CARE_PROVIDER_SITE_OTHER): Payer: Self-pay | Admitting: Family Medicine

## 2016-02-24 ENCOUNTER — Encounter: Payer: Self-pay | Admitting: Family Medicine

## 2016-02-24 VITALS — BP 112/74 | HR 118 | Temp 97.6°F | Ht 64.0 in | Wt 162.8 lb

## 2016-02-24 DIAGNOSIS — G90521 Complex regional pain syndrome I of right lower limb: Secondary | ICD-10-CM

## 2016-02-24 DIAGNOSIS — F32A Depression, unspecified: Secondary | ICD-10-CM

## 2016-02-24 DIAGNOSIS — M25571 Pain in right ankle and joints of right foot: Secondary | ICD-10-CM

## 2016-02-24 DIAGNOSIS — F329 Major depressive disorder, single episode, unspecified: Secondary | ICD-10-CM

## 2016-02-24 MED ORDER — NORTRIPTYLINE HCL 50 MG PO CAPS: 50.0000 mg | ORAL_CAPSULE | Freq: Every day | ORAL | Status: DC
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
## 2016-02-24 NOTE — Progress Notes (Signed)
Pre visit review using our clinic review tool, if applicable. No additional management support is needed unless otherwise documented below in the visit note. 

## 2016-02-26 ENCOUNTER — Telehealth: Payer: Self-pay | Admitting: Family Medicine

## 2016-02-26 ENCOUNTER — Ambulatory Visit: Payer: No Typology Code available for payment source | Attending: Orthopedic Surgery | Admitting: Physical Therapy

## 2016-02-26 ENCOUNTER — Encounter: Payer: Self-pay | Admitting: Physical Therapy

## 2016-02-26 DIAGNOSIS — M6281 Muscle weakness (generalized): Secondary | ICD-10-CM | POA: Insufficient documentation

## 2016-02-26 DIAGNOSIS — R262 Difficulty in walking, not elsewhere classified: Secondary | ICD-10-CM | POA: Insufficient documentation

## 2016-02-26 DIAGNOSIS — M25571 Pain in right ankle and joints of right foot: Secondary | ICD-10-CM | POA: Insufficient documentation
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
## 2016-02-26 NOTE — Therapy (Signed)
[place] MAIN [facility] SERVICES [address] [place], [place], [address] Phone: [phone]   Fax:  [phone]  Physical Therapy Treatment  Patient Details  Name: Danielle Strickland MRN: [id number] Date of Birth: [birth year] Referring Provider: Wylene Simmer MD  Encounter Date: 02/26/2016      PT End of Session - 02/26/16 1140    Visit Number 26   Number of Visits 35   Date for PT Re-Evaluation 03/05/16   PT Start Time 1102   PT Stop Time 1145   PT Time Calculation (min) 43 min   Activity Tolerance Patient tolerated treatment well;Patient limited by pain   Behavior During Therapy [facility] for tasks assessed/performed      Past Medical History  Diagnosis Date  . HPV in female   . Abnormal Pap smear of vagina and vaginal HPV     ascus   . Migraines   . Microscopic hematuria   . Mental disorder     anxiety  . Endometriosis   . 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 GERD (gastroesophageal reflux disease)   . IBS (irritable bowel syndrome)   . Liver disease     H/O hepatitis  . Allergy   . Peptic ulcer disease   . History of migraine   . Skin cancer     left thigh    Past Surgical History  Procedure Laterality Date  . Laparoscopically assisted vaginal hysterectomy  2006  . Tubal ligation    . Wisdom tooth extraction    . Salivary gland surgery    . Abdominal hysterectomy    . Appendectomy    . Cholecystectomy N/A 11/26/2015    Procedure: LAPAROSCOPIC CHOLECYSTECTOMY ;  Surgeon: Jules Husbands, MD;  Location: [facility] ORS;  Service: General;  Laterality: N/A;    There were no vitals filed for this visit.       TREATMENT: Warm up on Elliptical BUE/BLE level 1 forward/backward x3 min with cues to increase speed for increased cardiovascular and to improve ankle ROM;   Standing on airex, alternate toe taps to 8 inch step without rail assist x10 each; Standing one foot on airex, one foot on 8 inch step with balloon  taps x1 min each foot in front; Forward step ups on 8 inch step without rail assist x5 each foot leading;  Standing foot rocker, ankle DF stretch 10 sec hold x5;  Side stepping toes on narrow beam for ankle DF x2 laps  Patient required min VCs to improve foot placement for increased ankle DF; She also required min VCs to improve weight shift while on airex for better balance;  Sitting: Grade II-III PA/AP mobs to right ankle joint (talocrual) 10 sec bouts x3 each with passive DF stretch; Ankle DF AROM on RLE: 4 degrees;  Finished with interferential TENs, at tolerated intensity x15 min concurrent with moist heat; Patient reports no pain after treatment session; She reports that she is hoping to get her home TENs unit next week; She is waiting on her insurance settlement;                          PT Education - 02/26/16 1140    Education provided Yes   Education Details strengthening, ROM, HEP reinforced;    Person(s) Educated Patient   Methods Explanation;Verbal cues   Comprehension Verbalized understanding;Returned demonstration;Verbal cues required  PT Long Term Goals - 02/13/16 1050    PT LONG TERM GOAL #1   Title Patient will be independent in home exercise program to improve strength/mobility for better functional independence with ADLs by 03/05/16   Time 8   Period Weeks   Status On-going   PT LONG TERM GOAL #2   Title Patient (< 22 years old) will complete five times sit to stand test in < 10 seconds indicating an increased LE strength and improved balance by 03/05/16   Time 8   Period Weeks   Status Partially Met   PT LONG TERM GOAL #3   Title Patient will increase 10 meter walk test to >1.21ms as to improve gait speed for better community ambulation and to reduce fall risk. by 03/05/16   Time 8   Period Weeks   Status Achieved   PT LONG TERM GOAL #4   Title Patient will ascend/descend 4 stairs with 1 rail assist independently  forward reciprocally without loss of balance to improve ability to get in/out of home. by 03/05/16   Time 8   Period Weeks   Status Achieved   PT LONG TERM GOAL #5   Title Patient will report a worst pain of 3/10 on VAS in  right ankle           to improve tolerance with ADLs and reduced symptoms with activities. by 03/05/16   Time 8   Period Weeks   Status Not Met   PT LONG TERM GOAL #6   Title Patient will increase lower extremity functional scale to >60/80 to demonstrate improved functional mobility and increased tolerance with ADLs. by 03/05/16   Time 8   Period Weeks   Status Partially Met   PT LONG TERM GOAL #7   Title Patient will increase RLE ankle AROM DF: >5 degrees, PF: >60 degrees, IV: >15 degrees, EV: >15 degrees for increased functional ROM to improve foot clearance with gait tasks by 03/05/16   Time 4   Period Weeks   Status Partially Met   PT LONG TERM GOAL #8   Title Patient will increase BLE gross strength to 4+/5 as to improve functional strength for independent gait, increased standing tolerance and increased ADL ability. by 03/05/16   Time 8   Period Weeks   Status Partially Met               Plan - 02/26/16 1140    Clinical Impression Statement Patient instructed in advanced LE strengthening and ROM focusing on improving ankle DF; Patient required min VCs for correct exercise technique; Patient continues to have increased ankle pain particularly with DF to 5/10; She was able to demonstrate improved ankle DF slightly to 4 degrees. She would beneft from additional skiled PT Intervention to improve ankle ROM/strength and reduce pain with ADLs.    Rehab Potential Fair   Clinical Impairments Affecting Rehab Potential positive: motivation to get better, negative: chronic condition, anxious, co-morbidities; Clinical presentation is unstable as her pain comes and goes with no pattern, severity of pain limits mobility, uncontrolled anxiety   PT Frequency 2x / week    PT Duration 6 weeks   PT Treatment/Interventions Cryotherapy;Moist Heat;Balance training;Therapeutic exercise;Therapeutic activities;Functional mobility training;Stair training;Gait training;ADLs/Self Care Home Management;Neuromuscular re-education;Patient/family education;Manual techniques;Taping;Energy conservation;Dry needling;Passive range of motion;Electrical Stimulation;Ultrasound   PT Next Visit Plan work on strengthening, balance/TENs   PT Home Exercise Plan continue as given;    Consulted and Agree with Plan of Care Patient  Patient will benefit from skilled therapeutic intervention in order to improve the following deficits and impairments:  Abnormal gait, Decreased endurance, Impaired sensation, Decreased activity tolerance, Decreased strength, Pain, Decreased balance, Decreased mobility, Difficulty walking, Decreased range of motion, Decreased safety awareness, Impaired flexibility  Visit Diagnosis: Right ankle pain  Difficulty in walking, not elsewhere classified  Muscle weakness (generalized)     Problem List Patient Active Problem List   Diagnosis Date Noted  . Malleolar fracture 12/10/2015  . Complex regional pain syndrome of lower extremity 12/10/2015  . Hx MRSA infection 06/18/2015  . Abnormal chest x-ray 05/29/2015  . Family history of colon cancer 02/21/2013  . TMJ (dislocation of temporomandibular joint) 02/21/2013  . Tobacco abuse 02/21/2013  . Allergy   . Skin cancer   . Peptic ulcer disease   . Generalized anxiety disorder   . Herpes simplex without mention of complication   . IBS (irritable bowel syndrome)   . History of migraine     Lizvette Lightsey PT, DPT 02/26/2016, 11:42 AM  [place] MAIN [facility] SERVICES [address] [place], [place], [address] Phone: [phone]   Fax:  [phone]  Name: Danielle Strickland MRN: [id number] Date of Birth: 03-06-1966

## 2016-02-26 NOTE — Telephone Encounter (Signed)
Pt called wanting donna to call Discuss something was supposed to be put in medical notes on monday

## 2016-02-28 ENCOUNTER — Ambulatory Visit: Payer: No Typology Code available for payment source | Admitting: Physical Therapy

## 2016-02-28 ENCOUNTER — Encounter: Payer: Self-pay | Admitting: Physical Therapy

## 2016-02-28 DIAGNOSIS — M6281 Muscle weakness (generalized): Secondary | ICD-10-CM

## 2016-02-28 DIAGNOSIS — R262 Difficulty in walking, not elsewhere classified: Secondary | ICD-10-CM

## 2016-02-28 DIAGNOSIS — M25571 Pain in right ankle and joints of right foot: Secondary | ICD-10-CM
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
## 2016-03-02 ENCOUNTER — Encounter: Payer: Self-pay | Admitting: Family Medicine

## 2016-03-02 ENCOUNTER — Encounter: Payer: Self-pay | Admitting: Physical Therapy

## 2016-03-02 ENCOUNTER — Ambulatory Visit (INDEPENDENT_AMBULATORY_CARE_PROVIDER_SITE_OTHER): Payer: No Typology Code available for payment source | Admitting: Family Medicine

## 2016-03-02 ENCOUNTER — Ambulatory Visit: Payer: No Typology Code available for payment source | Admitting: Physical Therapy

## 2016-03-02 VITALS — BP 122/66 | HR 95 | Temp 97.8°F | Wt 163.5 lb

## 2016-03-02 DIAGNOSIS — M6281 Muscle weakness (generalized): Secondary | ICD-10-CM

## 2016-03-02 DIAGNOSIS — R262 Difficulty in walking, not elsewhere classified: Secondary | ICD-10-CM

## 2016-03-02 DIAGNOSIS — S82891S Other fracture of right lower leg, sequela: Secondary | ICD-10-CM

## 2016-03-02 DIAGNOSIS — M25571 Pain in right ankle and joints of right foot: Secondary | ICD-10-CM

## 2016-03-02 DIAGNOSIS — G90521 Complex regional pain syndrome I of right lower limb: Secondary | ICD-10-CM

## 2016-03-02 DIAGNOSIS — F411 Generalized anxiety disorder: Secondary | ICD-10-CM

## 2016-03-02 DIAGNOSIS — F4310 Post-traumatic stress disorder, unspecified: Secondary | ICD-10-CM | POA: Insufficient documentation

## 2016-03-02 MED ORDER — DIAZEPAM 5 MG PO TABS: 5.0000 mg | ORAL_TABLET | Freq: Three times a day (TID) | ORAL | Status: DC | PRN

## 2016-03-02 NOTE — Assessment & Plan Note (Signed)
>  25 minutes spent in face to face time with patient, >50% spent in counselling or coordination of care Improved with current dose of prozac. She was tearful but appropriate today. Just filled rx for notriptyilne and will call me in a few weeks with an update of her symptoms.

## 2016-03-02 NOTE — Progress Notes (Signed)
Pre visit review using our clinic review tool, if applicable. No additional management support is needed unless otherwise documented below in the visit note. 

## 2016-03-02 NOTE — Patient Instructions (Addendum)
Stretching: Calf - Towel    Sit with knee straight and towel looped around right foot. Gently pull on towel until stretch is felt in calf. Hold _15___ seconds. Repeat __2-3__ times per set. Do __2__ sets per session. Do __2-3__ sessions per day.  http://orth.exer.us/707   Copyright  VHI. All rights reserved.
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
## 2016-03-02 NOTE — Therapy (Signed)
[place] MAIN [facility] SERVICES [address] [place], [place], [address] Phone: [phone]   Fax:  [phone]  Physical Therapy Treatment  Patient Details  Name: DIMPLES PROBUS MRN: [id number] Date of Birth: 20-Mar-1966 Referring Provider: Wylene Simmer MD  Encounter Date: 03/02/2016      PT End of Session - 03/02/16 1343    Visit Number 28   Number of Visits 35   Date for PT Re-Evaluation 03/05/16   PT Start Time 1300   PT Stop Time 1345   PT Time Calculation (min) 45 min   Activity Tolerance Patient tolerated treatment well;Patient limited by pain   Behavior During Therapy [facility] for tasks assessed/performed      Past Medical History  Diagnosis Date  . HPV in female   . Abnormal Pap smear of vagina and vaginal HPV     ascus   . Migraines   . Microscopic hematuria   . Mental disorder     anxiety  . Endometriosis   . 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 GERD (gastroesophageal reflux disease)   . IBS (irritable bowel syndrome)   . Liver disease     H/O hepatitis  . Allergy   . Peptic ulcer disease   . History of migraine   . Skin cancer     left thigh    Past Surgical History  Procedure Laterality Date  . Laparoscopically assisted vaginal hysterectomy  2006  . Tubal ligation    . Wisdom tooth extraction    . Salivary gland surgery    . Abdominal hysterectomy    . Appendectomy    . Cholecystectomy N/A 11/26/2015    Procedure: LAPAROSCOPIC CHOLECYSTECTOMY ;  Surgeon: Jules Husbands, MD;  Location: [facility] ORS;  Service: General;  Laterality: N/A;    There were no vitals filed for this visit.      Subjective Assessment - 03/02/16 1319    Subjective Patient reports continued burning in right ankle; She reports having no income currently; She reports not being able to drive a car for 2 months per MD instructions and isn't sure if she can go back to work if she can't drive;    Pertinent History Personal  factors affecting rehab: anxiety/depression, fear of injuring right ankle, current fracture in ankle, severe pain, decreased balance and high fall risk, decreased caregiver support at home;    How long can you sit comfortably? feels tingling/burning;    How long can you stand comfortably? 15 min   How long can you walk comfortably? never comfortable with walking; increased pain with all walking;    Diagnostic tests MRI in September of right ankle and was diagnosed with ankle fracture; her last x-ray shows that it is still broken but is healing from the outside in;    Currently in Pain? Yes   Pain Score 4    Pain Location Ankle   Pain Orientation Right   Pain Descriptors / Indicators Burning;Sore   Pain Type Chronic pain   Pain Onset More than a month ago        TREATMENT: Warm up on Elliptical BUE/BLE level 1 forward/backward x3 min with cues to increase speed for increased cardiovascular and to improve ankle ROM;  Seated bike level 0 x2 min forward/backward; standing forward x1 min with cues to increase speed for better ankle mobility;  Standing heel off step stretch 15 sec hold x3 on RLE; Standing on 1/2  bolster (flat side up) ankle DF with heels on floor, mini squat x10 with cues to increase ROM to tolerance for increased ankle DF stretch; Standing full squat towards floor keeping heel down on floor (50%) x3 reps; Educated patient in full squat at home Fitter side/side weight shift x5 each direction;  Long sitting, calf stretch with towel 15 sec hold x2 RLE;  Patient's RLE ankle DF 3-4 degrees;  Finished with interferential TENs to right ankle concurrent with moist heat x15 min at tolerated intensity;  Patient is still waiting on settlement and has no income at this time; She is not sure when she can get a TENs unit.                          PT Education - 03/02/16 1343    Education provided Yes   Education Details stretches, HEP   Person(s) Educated  Patient   Methods Explanation;Verbal cues   Comprehension Verbalized understanding;Returned demonstration;Verbal cues required             PT Long Term Goals - 02/13/16 1050    PT LONG TERM GOAL #1   Title Patient will be independent in home exercise program to improve strength/mobility for better functional independence with ADLs by 03/05/16   Time 8   Period Weeks   Status On-going   PT LONG TERM GOAL #2   Title Patient (< 30 years old) will complete five times sit to stand test in < 10 seconds indicating an increased LE strength and improved balance by 03/05/16   Time 8   Period Weeks   Status Partially Met   PT LONG TERM GOAL #3   Title Patient will increase 10 meter walk test to >1.20ms as to improve gait speed for better community ambulation and to reduce fall risk. by 03/05/16   Time 8   Period Weeks   Status Achieved   PT LONG TERM GOAL #4   Title Patient will ascend/descend 4 stairs with 1 rail assist independently forward reciprocally without loss of balance to improve ability to get in/out of home. by 03/05/16   Time 8   Period Weeks   Status Achieved   PT LONG TERM GOAL #5   Title Patient will report a worst pain of 3/10 on VAS in  right ankle           to improve tolerance with ADLs and reduced symptoms with activities. by 03/05/16   Time 8   Period Weeks   Status Not Met   PT LONG TERM GOAL #6   Title Patient will increase lower extremity functional scale to >60/80 to demonstrate improved functional mobility and increased tolerance with ADLs. by 03/05/16   Time 8   Period Weeks   Status Partially Met   PT LONG TERM GOAL #7   Title Patient will increase RLE ankle AROM DF: >5 degrees, PF: >60 degrees, IV: >15 degrees, EV: >15 degrees for increased functional ROM to improve foot clearance with gait tasks by 03/05/16   Time 4   Period Weeks   Status Partially Met   PT LONG TERM GOAL #8   Title Patient will increase BLE gross strength to 4+/5 as to improve  functional strength for independent gait, increased standing tolerance and increased ADL ability. by 03/05/16   Time 8   Period Weeks   Status Partially Met  Plan - 03/02/16 1343    Clinical Impression Statement Instructed patient in advanced RLE ankle exercise to facilitate ankle DF. she has increased pain with increased weight shift to RLE and with deep squats. She was instructed in advanced calf stretch to faciliate better ROM. Patient continues to respond well to TENs. She would benefit from additional skilled PT Intervention to improve ankle ROM and return to PLOF.    Rehab Potential Fair   Clinical Impairments Affecting Rehab Potential positive: motivation to get better, negative: chronic condition, anxious, co-morbidities; Clinical presentation is unstable as her pain comes and goes with no pattern, severity of pain limits mobility, uncontrolled anxiety   PT Frequency 2x / week   PT Duration 6 weeks   PT Treatment/Interventions Cryotherapy;Moist Heat;Balance training;Therapeutic exercise;Therapeutic activities;Functional mobility training;Stair training;Gait training;ADLs/Self Care Home Management;Neuromuscular re-education;Patient/family education;Manual techniques;Taping;Energy conservation;Dry needling;Passive range of motion;Electrical Stimulation;Ultrasound   PT Next Visit Plan work on strengthening, balance/TENs   PT Home Exercise Plan continue as given;    Consulted and Agree with Plan of Care Patient      Patient will benefit from skilled therapeutic intervention in order to improve the following deficits and impairments:  Abnormal gait, Decreased endurance, Impaired sensation, Decreased activity tolerance, Decreased strength, Pain, Decreased balance, Decreased mobility, Difficulty walking, Decreased range of motion, Decreased safety awareness, Impaired flexibility  Visit Diagnosis: Right ankle pain  Difficulty in walking, not elsewhere classified  Muscle  weakness (generalized)     Problem List Patient Active Problem List   Diagnosis Date Noted  . PTSD (post-traumatic stress disorder) 03/02/2016  . Malleolar fracture 12/10/2015  . Complex regional pain syndrome of lower extremity 12/10/2015  . Hx MRSA infection 06/18/2015  . Abnormal chest x-ray 05/29/2015  . Family history of colon cancer 02/21/2013  . TMJ (dislocation of temporomandibular joint) 02/21/2013  . Tobacco abuse 02/21/2013  . Allergy   . Skin cancer   . Peptic ulcer disease   . Generalized anxiety disorder   . Herpes simplex without mention of complication   . IBS (irritable bowel syndrome)   . History of migraine     Maryanne Huneycutt PT, DPT 03/02/2016, 1:46 PM  [place] MAIN [facility] SERVICES [address] [place], [place], [address] Phone: [phone]   Fax:  [phone]  Name: MAIREAD SCHWARZKOPF MRN: [id number] Date of Birth: 1966-04-20

## 2016-03-04 ENCOUNTER — Encounter: Payer: Self-pay | Admitting: Physical Therapy

## 2016-03-04 ENCOUNTER — Ambulatory Visit: Payer: No Typology Code available for payment source | Admitting: Physical Therapy

## 2016-03-04 DIAGNOSIS — M6281 Muscle weakness (generalized): Secondary | ICD-10-CM

## 2016-03-04 DIAGNOSIS — M25571 Pain in right ankle and joints of right foot: Secondary | ICD-10-CM

## 2016-03-04 DIAGNOSIS — R262 Difficulty in walking, not elsewhere classified: Secondary | ICD-10-CM

## 2016-03-04 NOTE — Therapy (Signed)
[place] MAIN [facility] SERVICES [address] [place], [place], [address] Phone: [phone]   Fax:  [phone]  Physical Therapy Treatment/Discharge Summary  Patient Details  Name: SOLENE HEREFORD MRN: [id number] Date of Birth: 1965-12-17 Referring Provider: Wylene Simmer MD  Encounter Date: 03/04/2016      PT End of Session - 03/04/16 1804    Visit Number 29   Number of Visits 35   Date for PT Re-Evaluation 03/05/16   PT Start Time 1300   PT Stop Time 6568   PT Time Calculation (min) 55 min   Activity Tolerance Patient tolerated treatment well;Patient limited by pain   Behavior During Therapy [facility] for tasks assessed/performed      Past Medical History  Diagnosis Date  . HPV in female   . Abnormal Pap smear of vagina and vaginal HPV     ascus   . Migraines   . Microscopic hematuria   . Mental disorder     anxiety  . Endometriosis   . 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 GERD (gastroesophageal reflux disease)   . IBS (irritable bowel syndrome)   . Liver disease     H/O hepatitis  . Allergy   . Peptic ulcer disease   . History of migraine   . Skin cancer     left thigh    Past Surgical History  Procedure Laterality Date  . Laparoscopically assisted vaginal hysterectomy  2006  . Tubal ligation    . Wisdom tooth extraction    . Salivary gland surgery    . Abdominal hysterectomy    . Appendectomy    . Cholecystectomy N/A 11/26/2015    Procedure: LAPAROSCOPIC CHOLECYSTECTOMY ;  Surgeon: Jules Husbands, MD;  Location: [facility] ORS;  Service: General;  Laterality: N/A;    There were no vitals filed for this visit.      Subjective Assessment - 03/04/16 1802    Subjective Patient reports continued right ankle pain with burning and aching along medial and anterior right ankle. She reports, "I have pain all the time. Its worse at night when I'm trying to sleep, but its there all day and night." She reports  having some good days and other bad days.    Pertinent History Personal factors affecting rehab: anxiety/depression, fear of injuring right ankle, current fracture in ankle, severe pain, decreased balance and high fall risk, decreased caregiver support at home;    How long can you sit comfortably? feels tingling/burning;    How long can you stand comfortably? 15 min   How long can you walk comfortably? never comfortable with walking; increased pain with all walking;    Diagnostic tests MRI in September of right ankle and was diagnosed with ankle fracture; her last x-ray shows that it is still broken but is healing from the outside in;    Currently in Pain? Yes   Pain Score 3    Pain Location Ankle   Pain Orientation Right   Pain Descriptors / Indicators Burning;Sore   Pain Type Chronic pain   Pain Onset More than a month ago            [facility] PT Assessment - 03/04/16 0001    Observation/Other Assessments   Lower Extremity Functional Scale  35/80 (the lower the score the greater the disability) Improved from initial eval which was 16/80; however no change from last reassessment on 02/06/16 which was 34/80  AROM   Right Ankle Dorsiflexion 4  still has pain with ankle DF; inconsistent;    Right Ankle Plantar Flexion 80   Right Ankle Inversion 42   Right Ankle Eversion 30   Strength   Right Ankle Dorsiflexion 4-/5   Right Ankle Plantar Flexion 4/5   Right Ankle Inversion 4/5   Right Ankle Eversion 4/5   6 minute walk test results    Aerobic Endurance Distance Walked 1250   Endurance additional comments community ambulator; impaired from normal age group norms of 1800 feet;   Standardized Balance Assessment   Five times sit to stand comments  16 sec without HHA (>10 sec indicates increased risk for falls); improved from eval which was 39 sec;    10 Meter Walk 1.05 m/s, community ambulator speed, low fall risk; improved from initial eval which was 0.5 m/s        TREATMENT: Warm  up on Elliptical level 0 x3 min (forward/backward) with cues to improve weight shift and increase ankle ROM while on machine;  Leg press, BLE plate 120# 3Y10 with cues to slow down eccentric return for better strengthening; Leg press RLE only heel raises 60# 2x15 with cues to increase ankle DF during eccentric return;  PT instructed patient in 10 meter walk, 5 times sit<>stand, 6 min walk and assessed strength/ROM; see above;  Finished with interferential TENs to right ankle, x15 min concurrent with moist heat; Educated patient on safe set up and correct settings Patient has no income at this time and is waiting on her settlement. She has an MD order for a home TENs unit but is unable to get one at this time She reports significant pain relief with TENs.                     PT Education - 03/04/16 1804    Education provided Yes   Education Details HEP reinforced, TENs safety, recommendations   Person(s) Educated Patient   Methods Explanation;Verbal cues   Comprehension Verbalized understanding;Returned demonstration;Verbal cues required             PT Long Term Goals - 03/04/16 1324    PT LONG TERM GOAL #1   Title Patient will be independent in home exercise program to improve strength/mobility for better functional independence with ADLs by 03/05/16   Time 8   Period Weeks   Status Achieved   PT LONG TERM GOAL #2   Title Patient (< 61 years old) will complete five times sit to stand test in < 10 seconds indicating an increased LE strength and improved balance by 03/05/16   Time 8   Period Weeks   Status Partially Met   PT LONG TERM GOAL #3   Title Patient will increase 10 meter walk test to >1.60ms as to improve gait speed for better community ambulation and to reduce fall risk. by 03/05/16   Time 8   Period Weeks   Status Achieved   PT LONG TERM GOAL #4   Title Patient will ascend/descend 4 stairs with 1 rail assist independently forward reciprocally  without loss of balance to improve ability to get in/out of home. by 03/05/16   Time 8   Period Weeks   Status Achieved   PT LONG TERM GOAL #5   Title Patient will report a worst pain of 3/10 on VAS in  right ankle           to improve tolerance with ADLs and reduced  symptoms with activities. by 03/05/16   Time 8   Period Weeks   Status Not Met   PT LONG TERM GOAL #6   Title Patient will increase lower extremity functional scale to >60/80 to demonstrate improved functional mobility and increased tolerance with ADLs. by 03/05/16   Time 8   Period Weeks   Status Partially Met   PT LONG TERM GOAL #7   Title Patient will increase RLE ankle AROM DF: >5 degrees, PF: >60 degrees, IV: >15 degrees, EV: >15 degrees for increased functional ROM to improve foot clearance with gait tasks by 03/05/16   Time 4   Period Weeks   Status Partially Met   PT LONG TERM GOAL #8   Title Patient will increase BLE gross strength to 4+/5 as to improve functional strength for independent gait, increased standing tolerance and increased ADL ability. by 03/05/16   Time 8   Period Weeks   Status Partially Met               Plan - 03/04/16 1804    Clinical Impression Statement Patient continues to have chronic right ankle pain. She does experience some pain relief with TENs unit. She has a MD order for home TENs unit but is unable to afford it due to lack of income. Patient does demonstrate signficant improvement in ROM, strength, gait and functional mobility compared to initial evaluation in January 2017. However she continues to have limited ankle DF due to pain and tightness. Her limited ROM does limit her ability to clear her foot especially when walking uphill. She is able to ambulate a community ambulator distance with 6 min walk test but is significantly less than age group norms of 1800 feet. She is modified independent in all self care ADLs and gait tasks while taking longer time to complete tasks. She  has an extensive home exercise program. She has received extensive skilled conservative treatment. She is appropriate for discharge from PT at this time. PT recommended patient continue with HEP to try and make further gains, although her progress has plateaued.    Rehab Potential Fair   Clinical Impairments Affecting Rehab Potential positive: motivation to get better, negative: chronic condition, anxious, co-morbidities; Clinical presentation is unstable as her pain comes and goes with no pattern, severity of pain limits mobility, uncontrolled anxiety   PT Frequency 2x / week   PT Duration 6 weeks   PT Treatment/Interventions Cryotherapy;Moist Heat;Balance training;Therapeutic exercise;Therapeutic activities;Functional mobility training;Stair training;Gait training;ADLs/Self Care Home Management;Neuromuscular re-education;Patient/family education;Manual techniques;Taping;Energy conservation;Dry needling;Passive range of motion;Electrical Stimulation;Ultrasound   PT Next Visit Plan discharge from [place] continue as given;    Consulted and Agree with Plan of Care Patient      Patient will benefit from skilled therapeutic intervention in order to improve the following deficits and impairments:  Abnormal gait, Decreased endurance, Impaired sensation, Decreased activity tolerance, Decreased strength, Pain, Decreased balance, Decreased mobility, Difficulty walking, Decreased range of motion, Decreased safety awareness, Impaired flexibility  Visit Diagnosis: Right ankle pain  Difficulty in walking, not elsewhere classified  Muscle weakness (generalized)     Problem List Patient Active Problem List   Diagnosis Date Noted  . PTSD (post-traumatic stress disorder) 03/02/2016  . Malleolar fracture 12/10/2015  . Complex regional pain syndrome of lower extremity 12/10/2015  . Hx MRSA infection 06/18/2015  . Abnormal chest x-ray 05/29/2015  . Family history of colon cancer  02/21/2013  . TMJ (dislocation of temporomandibular joint) 02/21/2013  .  Tobacco abuse 02/21/2013  . Allergy   . Skin cancer   . Peptic ulcer disease   . Generalized anxiety disorder   . Herpes simplex without mention of complication   . IBS (irritable bowel syndrome)   . History of migraine     Shaquitta Burbridge PT, DPT 03/04/2016, 6:08 PM  [place] MAIN [facility] SERVICES [address] [place], [place], [address] Phone: [phone]   Fax:  [phone]  Name: NIAOMI CARTAYA MRN: [id number] Date of Birth: 1966-04-06

## 2016-03-09 ENCOUNTER — Telehealth: Payer: Self-pay | Admitting: Family Medicine

## 2016-03-09 NOTE — Telephone Encounter (Signed)
Pt wanted donna to call her regarding  Disability and something she told you at last vist.  Pt would not go into detail about this

## 2016-03-09 NOTE — Telephone Encounter (Addendum)
Spoke with [employer].  She states that her short term disability stopped as of 02/24/2016.  She states her records has been turned over to [place] disability but that it will not go into effect until 05/10/2016.  She states Philippa Chester (her Tourist information centre manager) told her that a Nurse has called [place] on numerous occasions to get additional information about her limitations but no one has returned her call.  I advised there are no phone calls in her record that indicates anyone has called [place] from her [place] is asking that we call Philippa Chester at [phone] ext [phone] to find out what additional information they need. Her claim [id number].  Danielle Strickland also needs another letter to turn in to her [place] dated for 03/19/2016.  Letter written as requested.  She also stated she was released from PT on 03/04/2016 due to they felt there was nothing else they could do for her.  Spoke with Dr. Lorelei Pont who ask that I send this message to [place] to see if she can call Philippa Chester to find out exactly what they are needing.

## 2016-03-10 ENCOUNTER — Telehealth: Payer: Self-pay | Admitting: Family Medicine

## 2016-03-10 NOTE — Telephone Encounter (Signed)
[employer] faxed paperwork Requesting additional information In dr copland's in box

## 2016-03-11 ENCOUNTER — Encounter: Payer: Self-pay | Admitting: Family Medicine

## 2016-03-11 NOTE — Telephone Encounter (Signed)
Typically paperwork takes 14 days to be completed. I have had multiple emergencies all day. I received the correct paperwork only in the last 24 hours. We will do the best we can.

## 2016-03-11 NOTE — Telephone Encounter (Signed)
I will call Philippa Chester this week to see what they need. I will keep everyone updated. Thanks Maegen

## 2016-03-11 NOTE — Telephone Encounter (Signed)
Pt stopped by to pick up 2 forms. She asked if Dr. Lorelei Pont received ppw from [facility]. I told her we did receive it and it's in Dr. Lorelei Pont inbox. She asked if this could be expedited due to [facility] not having right office info for last two weeks. She will not get paid until this letter is complete.   Pt thanks everyone for their help

## 2016-03-12 NOTE — Telephone Encounter (Signed)
I left a message on patient's voice mail letter faxed to [facility].

## 2016-04-20 ENCOUNTER — Telehealth: Payer: Self-pay

## 2016-04-20 MED ORDER — FLUOXETINE HCL 20 MG PO CAPS: 20.0000 mg | ORAL_CAPSULE | Freq: Every day | ORAL | Status: DC

## 2016-04-20 NOTE — Telephone Encounter (Addendum)
Lecia notified letter is ready to be picked up at the front desk.

## 2016-04-20 NOTE — Telephone Encounter (Signed)
Pt request refill on prozac to [facility] [address]; pt has enough med to last until 04/26/16 and has f/u appt on 04/29/16. Refill done per protocol and info from 02/24/16 visit; pt also request note sent to [facility] about needing the monthly letter for loan co. Pt request cb when ready for pick up and pt request to pick up on 04/21/16 when she has transportation.

## 2016-04-24 ENCOUNTER — Other Ambulatory Visit: Payer: Self-pay | Admitting: Gastroenterology

## 2016-04-29 ENCOUNTER — Encounter: Payer: Self-pay | Admitting: Family Medicine

## 2016-04-29 ENCOUNTER — Ambulatory Visit (INDEPENDENT_AMBULATORY_CARE_PROVIDER_SITE_OTHER): Payer: Self-pay | Admitting: Family Medicine

## 2016-04-29 VITALS — BP 120/70 | HR 120 | Temp 98.3°F | Ht 64.0 in | Wt 162.5 lb

## 2016-04-29 DIAGNOSIS — S82891S Other fracture of right lower leg, sequela: Secondary | ICD-10-CM

## 2016-04-29 DIAGNOSIS — F4310 Post-traumatic stress disorder, unspecified: Secondary | ICD-10-CM

## 2016-04-29 DIAGNOSIS — F411 Generalized anxiety disorder: Secondary | ICD-10-CM

## 2016-04-29 DIAGNOSIS — G90521 Complex regional pain syndrome I of right lower limb: Secondary | ICD-10-CM

## 2016-04-29 MED ORDER — FLUOXETINE HCL 20 MG PO CAPS: 20.0000 mg | ORAL_CAPSULE | Freq: Every day | ORAL | Status: DC

## 2016-04-29 MED ORDER — FUROSEMIDE 20 MG PO TABS: ORAL_TABLET | ORAL | Status: DC
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
## 2016-04-29 NOTE — Progress Notes (Signed)
Pre visit review using our clinic review tool, if applicable. No additional management support is needed unless otherwise documented below in the visit note. 

## 2016-05-04 ENCOUNTER — Other Ambulatory Visit: Payer: Self-pay | Admitting: Family Medicine

## 2016-05-05 NOTE — Telephone Encounter (Signed)
Rx called in to requested pharmacy 

## 2016-05-05 NOTE — Telephone Encounter (Signed)
Last f/u 02/2016 

## 2016-05-06 ENCOUNTER — Telehealth: Payer: Self-pay

## 2016-05-06 NOTE — Telephone Encounter (Signed)
Pt called and left a message asking for Butch Penny. She said she needed a letter for the month to show that she had an office visit as well as a copy of the office visit note. She said she usually shows her license and gets a copy of her OV note, but that the front office would not do it for her. She wants to talk to Butch Penny because Butch Penny has helped her in the past with the Letter and OV Notes. She was hoping to get it today because her sister is driving her around since she does not have a car. Please call her back [phone].

## 2016-05-06 NOTE — Telephone Encounter (Signed)
Pandora notified letter and last office note is ready to be picked up at the front desk.

## 2016-05-23 ENCOUNTER — Emergency Department: Payer: No Typology Code available for payment source

## 2016-05-23 ENCOUNTER — Emergency Department
Admission: EM | Admit: 2016-05-23 | Discharge: 2016-05-23 | Disposition: A | Discharge status: Home / Self Care | Payer: No Typology Code available for payment source | Attending: Emergency Medicine | Admitting: Emergency Medicine

## 2016-05-23 ENCOUNTER — Encounter: Payer: Self-pay | Admitting: Emergency Medicine

## 2016-05-23 DIAGNOSIS — Y9389 Activity, other specified: Secondary | ICD-10-CM | POA: Insufficient documentation

## 2016-05-23 DIAGNOSIS — Z79899 Other long term (current) drug therapy: Secondary | ICD-10-CM | POA: Insufficient documentation

## 2016-05-23 DIAGNOSIS — Z87891 Personal history of nicotine dependence: Secondary | ICD-10-CM | POA: Insufficient documentation

## 2016-05-23 DIAGNOSIS — Z23 Encounter for immunization: Secondary | ICD-10-CM | POA: Insufficient documentation

## 2016-05-23 DIAGNOSIS — Y929 Unspecified place or not applicable: Secondary | ICD-10-CM | POA: Insufficient documentation

## 2016-05-23 DIAGNOSIS — Z85828 Personal history of other malignant neoplasm of skin: Secondary | ICD-10-CM | POA: Insufficient documentation

## 2016-05-23 DIAGNOSIS — Y999 Unspecified external cause status: Secondary | ICD-10-CM | POA: Insufficient documentation

## 2016-05-23 DIAGNOSIS — W270XXA Contact with workbench tool, initial encounter: Secondary | ICD-10-CM | POA: Insufficient documentation

## 2016-05-23 DIAGNOSIS — S61412A Laceration without foreign body of left hand, initial encounter: Secondary | ICD-10-CM | POA: Insufficient documentation

## 2016-05-23 MED ORDER — LIDOCAINE-EPINEPHRINE (PF) 1 %-1:200000 IJ SOLN
10.0000 mL | Freq: Once | INTRAMUSCULAR | Status: AC
  Administered 2016-05-23: 10 mL
  Filled 2016-05-23: qty 30

## 2016-05-23 MED ORDER — TETANUS-DIPHTH-ACELL PERTUSSIS 5-2.5-18.5 LF-MCG/0.5 IM SUSP
0.5000 mL | Freq: Once | INTRAMUSCULAR | Status: AC
  Administered 2016-05-23: 0.5 mL via INTRAMUSCULAR
  Filled 2016-05-23: qty 0.5

## 2016-05-23 NOTE — ED Notes (Signed)

## 2016-05-23 NOTE — ED Provider Notes (Signed)
[facility] Emergency Department Provider Note  ____________________________________________  Time seen: Approximately 5:23 PM  I have reviewed the triage vital signs and the nursing notes.   HISTORY  Chief Complaint Puncture Wound    HPI Danielle Strickland is a 50 y.o. female who presents to emergency department complaining of a laceration to her left hand. Patient states that she was trying to put a metal cart together with her grandson when the screwdriver slipped and cut the palmar aspect of her left hand. Patient reports immediate pain to area. She is able to bend and move all fingers appropriately. She states that she was able control bleeding with direct pressure. She reports sharp/burning pain to site at this time. No numbness or tingling in any digit. No other injuries or complaints. Patient is not taking any medications prior to arrival. Patient is not on blood thinners. Current on her tetanus immunization.   Past Medical History:  Diagnosis Date  . Abnormal Pap smear of vagina and vaginal HPV    ascus   . Allergy   . Endometriosis   . GERD (gastroesophageal reflux disease)   . 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 History of migraine   . HPV in female   . IBS (irritable bowel syndrome)   . Liver disease    H/O hepatitis  . Mental disorder    anxiety  . Microscopic hematuria   . Migraines   . Peptic ulcer disease   . Skin cancer    left thigh    Patient Active Problem List   Diagnosis Date Noted  . PTSD (post-traumatic stress disorder) 03/02/2016  . Malleolar fracture 12/10/2015  . Complex regional pain syndrome of lower extremity 12/10/2015  . Hx MRSA infection 06/18/2015  . Abnormal chest x-ray 05/29/2015  . Family history of colon cancer 02/21/2013  . TMJ (dislocation of temporomandibular joint) 02/21/2013  . Tobacco abuse 02/21/2013  . Allergy   . Skin cancer   . Peptic ulcer disease   . Generalized  anxiety disorder   . Herpes simplex without mention of complication   . IBS (irritable bowel syndrome)   . History of migraine     Past Surgical History:  Procedure Laterality Date  . ABDOMINAL HYSTERECTOMY    . APPENDECTOMY    . CHOLECYSTECTOMY N/A 11/26/2015   Procedure: LAPAROSCOPIC CHOLECYSTECTOMY ;  Surgeon: Jules Husbands, MD;  Location: [facility] ORS;  Service: General;  Laterality: N/A;  . laparoscopically assisted vaginal hysterectomy  2006  . SALIVARY GLAND SURGERY    . TUBAL LIGATION    . WISDOM TOOTH EXTRACTION      Prior to Admission medications   Medication Sig Start Date End Date Taking? Authorizing Provider  acyclovir (ZOVIRAX) 400 MG tablet Take 400 mg by mouth daily as needed (for outbreaks).    Historical Provider, MD  diazepam (VALIUM) 5 MG tablet TAKE ONE TABLET BY MOUTH EVERY 8 HOURS AS NEEDED FOR ANXIETY 05/05/16   Lucille Passy, MD  FLUoxetine (PROZAC) 20 MG capsule Take 1 capsule (20 mg total) by mouth daily. 04/29/16   Owens Loffler, MD  furosemide (LASIX) 20 MG tablet 1/2 tab po prn swelling 04/29/16   Owens Loffler, MD  Linaclotide (LINZESS) 145 MCG CAPS capsule Take 1 capsule (145 mcg total) by mouth daily as needed. Patient taking differently: Take 145 mcg by mouth daily as needed (for constipation).  10/02/15   Lucilla Lame, MD  nortriptyline (PAMELOR) 50 MG  capsule Take 1 capsule (50 mg total) by mouth daily. 02/24/16   Owens Loffler, MD  pantoprazole (PROTONIX) 40 MG tablet TAKE ONE TABLET BY MOUTH ONCE DAILY 04/24/16   Lucilla Lame, MD  SUMAtriptan (IMITREX) 50 MG tablet Take 50 mg by mouth every 2 (two) hours as needed for migraine.     [name], PA-C    Allergies Augmentin [amoxicillin-pot clavulanate]; Codeine; Latex; and Aspirin  Family History  Problem Relation Age of Onset  . Cancer Mother     colon   . Breast cancer Maternal Grandmother   . Thyroid cancer Maternal Aunt   . Breast cancer Maternal Aunt     Social History Social History   Substance Use Topics  . Smoking status: Former Smoker    Packs/day: 1.00    Types: Cigarettes    Quit date: 03/03/2015  . Smokeless tobacco: Never Used     Comment: Quit May 8  . Alcohol use No     Review of Systems  Constitutional: No fever/chills Cardiovascular: no chest pain. Respiratory: no cough. No SOB. Musculoskeletal: Negative for musculoskeletal pain. Skin: Positive for laceration to the palmar aspect of the left hand. Neurological: Negative for headaches, focal weakness or numbness. 10-point ROS otherwise negative.  ____________________________________________   PHYSICAL EXAM:  VITAL SIGNS: ED Triage Vitals [05/23/16 1652]  Enc Vitals Group     BP      Pulse      Resp      Temp      Temp src      SpO2      Weight      Height      Head Circumference      Peak Flow      Pain Score 10     Pain Loc      Pain Edu?      Excl. in [place]?      Constitutional: Alert and oriented. Well appearing and in no acute distress. Eyes: Conjunctivae are normal. PERRL. EOMI. Head: Atraumatic. Cardiovascular: Normal rate, regular rhythm. Normal S1 and S2.  Good peripheral circulation. Respiratory: Normal respiratory effort without tachypnea or retractions. Lungs CTAB. Good air entry to the bases with no decreased or absent breath sounds. Musculoskeletal: Full range of motion to all extremities. No gross deformities appreciated. Neurologic:  Normal speech and language. No gross focal neurologic deficits are appreciated.  Skin:  Skin is warm, dry and intact. No rash noted.Laceration noted to left palm. Area is bleeding. Not well controlled with direct pressure. Lidocaine with epi is injected. This with direct pressure results bleeding. Laceration is superficial. Approximately 5 cm in length. Ragged in nature. Psychiatric: Mood and affect are normal. Speech and behavior are normal. Patient exhibits appropriate insight and  judgement.   ____________________________________________   LABS (all labs ordered are listed, but only abnormal results are displayed)  Labs Reviewed - No data to display ____________________________________________  EKG   ____________________________________________  RADIOLOGY Diamantina Providence Farhiya Rosten, personally viewed and evaluated these images (plain radiographs) as part of my medical decision making, as well as reviewing the written report by the radiologist.  Dg Hand Complete Left  Result Date: 05/23/2016 CLINICAL DATA:  Large puncture wound, palm of hand chest under middle finger. EXAM: LEFT HAND - COMPLETE 3+ VIEW COMPARISON:  None. FINDINGS: Osseous alignment is normal. No fracture line or displaced fracture fragment identified. Soft tissue irregularity at the level of the metacarpophalangeal joint spaces, ventral aspect, compatible with the given history of puncture  wound. Small foreign body just deep to this soft tissue defect is likely a chronic benign calcification. IMPRESSION: Soft tissue irregularity, ventral aspect of hand, compatible with given history of puncture wound. Small foreign body just deep to this soft tissue defect is of uncertain etiology but favored to be a chronic benign calcification. No osseous fracture or dislocation. Electronically Signed   By: Franki Cabot M.D.   On: 05/23/2016 17:54   ____________________________________________    PROCEDURES  Procedure(s) performed:    Procedures LACERATION REPAIR Performed by: Darletta Moll Authorized by: Charline Bills Jock Mahon Consent: Verbal consent obtained. Risks and benefits: risks, benefits and alternatives were discussed Consent given by: patient Patient identity confirmed: provided demographic data Prepped and Draped in normal sterile fashion Wound explored  Laceration Location: Left palm  Laceration Length: 5 cm  No Foreign Bodies seen or palpated  Anesthesia: local  infiltration  Local anesthetic: lidocaine 1% with epinephrine  Anesthetic total: 5 ml  Irrigation method: syringe Amount of cleaning: standard  Skin closure: Dermabond     Patient tolerance: Patient tolerated the procedure well with no immediate complications.    Medications  Tdap (BOOSTRIX) injection 0.5 mL (0.5 mLs Intramuscular Given 05/23/16 1921)  lidocaine-EPINEPHrine (XYLOCAINE-EPINEPHrine) 1 %-1:200000 (PF) injection 10 mL (10 mLs Infiltration Given by Other 05/23/16 1922)     ____________________________________________   INITIAL IMPRESSION / ASSESSMENT AND PLAN / ED COURSE  Pertinent labs & imaging results that were available during my care of the patient were reviewed by me and considered in my medical decision making (see chart for details).  Clinical Course    Patient's diagnosis is consistent with Left hand laceration. Hand is x-rayed with no acute osseous abnormality or foreign body. Laceration is anesthetized and thoroughly cleansed. Small venule was bleeding which was successfully stopped using lidocaine with epi and direct pressure. Wound is covered with Dermabond. Patient may take Tylenol or Motrin as needed for symptom relief. Patient will follow-up with primary care as needed..Patient is given ED precautions to return to the ED for any worsening or new symptoms.     ____________________________________________  FINAL CLINICAL IMPRESSION(S) / ED DIAGNOSES  Final diagnoses:  Hand laceration, left, initial encounter      NEW MEDICATIONS STARTED DURING THIS VISIT:  New Prescriptions   No medications on file        This chart was dictated using voice recognition software/Dragon. Despite best efforts to proofread, errors can occur which can change the meaning. Any change was purely unintentional.    Darletta Moll, PA-C 05/23/16 1936    Lisa Roca, MD 05/26/16 [phone]

## 2016-05-23 NOTE — ED Notes (Signed)
Pt states was screwing a screw into an old car and ran the screwdriver across her palm. Pt presents with laceration across her R palm, some bleeding noted at this time.

## 2016-05-23 NOTE — ED Triage Notes (Signed)
Screw driver into the palm of left hand. Bleeding controlled

## 2016-06-03 ENCOUNTER — Other Ambulatory Visit: Payer: Self-pay | Admitting: Family Medicine

## 2016-06-03 NOTE — Telephone Encounter (Signed)
Last f/u 02/2016 

## 2016-06-04 NOTE — Telephone Encounter (Signed)
Rx called in to requested pharmacy 

## 2016-06-08 ENCOUNTER — Telehealth: Payer: Self-pay | Admitting: Family Medicine

## 2016-06-08 NOTE — Telephone Encounter (Signed)
Pt called to speak with Butch Penny about a letter that she needs completed for August.  Can someone please call her back about this.  She can be reached a [phone].

## 2016-06-09 NOTE — Telephone Encounter (Signed)
Pt called back needing a letter completed for august Dr copland can you help with this. Is the letter like the one dated 04/20/16.  If so, if it is ok with you I can print have stamp your name and give to pt Please let me know Thanks robin   Best number [phone]

## 2016-06-10 ENCOUNTER — Telehealth: Payer: Self-pay | Admitting: Family Medicine

## 2016-06-10 ENCOUNTER — Encounter: Payer: Self-pay | Admitting: Family Medicine

## 2016-06-10 NOTE — Telephone Encounter (Signed)
Patient notified as instructed by telephone. Patient stated that she will wait for phone call this afternoon.

## 2016-06-10 NOTE — Telephone Encounter (Signed)
Pt is expecting a call from Dr Lorelei Pont today between 5-530, Pt would like you to call her cell first. If there is no answer, could you please call her sisters phone at [phone] Thanks

## 2016-06-10 NOTE — Telephone Encounter (Signed)
Please let her know I will call her around 5 o'clock today to discuss.

## 2016-06-11 NOTE — Telephone Encounter (Signed)
Long conversation. Letter written. Paperwork upcoming.

## 2016-06-15 ENCOUNTER — Telehealth: Payer: Self-pay | Admitting: Family Medicine

## 2016-06-15 NOTE — Telephone Encounter (Signed)
Shabnam was checking to see if we had received any paper work from the long term disability company.  She states they supposedly sent a letter requesting her medical records.  I advised the only paper work that I have seen recently was from [place] and those forms had been completed and mailed back on 06/12/2016.  Antje states she will call the disability company and see if they did really send the request.  Ramond Craver who normally takes care of records releases told Kaylanie she had not received anything either.  Arva is scheduled for follow up with Dr. Lorelei Pont on 07/01/2016 at 2:15 pm.

## 2016-06-15 NOTE — Telephone Encounter (Signed)
Pt is requesting a status on the long term disability claim form  Please call pt back [phone]

## 2016-06-16 NOTE — Telephone Encounter (Signed)
Pt called to check on [employer] records release for insurance company. I spoke with her and called Jeani Hawking at [employer] who said she would process the request today.

## 2016-06-16 NOTE — Telephone Encounter (Signed)
Paperwork form [employer] was asking for records we did receive paperwork 06/08/16.  This was sent to [employer] on 06/09/16.  They take 14-17 business days to process.  Jeani Hawking call to expedite this.  I spoke with pt and advise her that lynn called our copier service and they would expedite the request today. Pt also mentioned they only needed July visit but [employer] put from 01/25/16 to present advise pt copier service will send information requested on paper

## 2016-06-23 ENCOUNTER — Telehealth: Payer: Self-pay

## 2016-06-23 ENCOUNTER — Telehealth: Payer: Self-pay | Admitting: Family Medicine

## 2016-06-23 NOTE — Telephone Encounter (Signed)
Danielle Strickland with Disability claims left v/m requesting Dr Lorelei Pont to cb and speak with Dr Alvino Blood about pts restrictions and limitations. cb # [phone] or [phone].

## 2016-06-23 NOTE — Telephone Encounter (Signed)
Noted  

## 2016-06-23 NOTE — Telephone Encounter (Signed)
Called them back and LMOM.

## 2016-06-23 NOTE — Telephone Encounter (Signed)
Danielle Strickland @ dr Leida Lauth office called wanting to talk to you About Danielle Strickland disability paperwork. Phone number [phone]

## 2016-06-23 NOTE — Telephone Encounter (Signed)
Called back and LMOM.

## 2016-06-24 NOTE — Telephone Encounter (Signed)
Multiple calls have been attempted, but no one answers the phone.

## 2016-06-24 NOTE — Telephone Encounter (Signed)
Discussed with Danielle Strickland  Discussed that Dr. Jenny Reichmann Hewitt's notes and pain management would also be very relevant in this case.

## 2016-07-01 ENCOUNTER — Encounter: Payer: Self-pay | Admitting: *Deleted

## 2016-07-01 ENCOUNTER — Ambulatory Visit (INDEPENDENT_AMBULATORY_CARE_PROVIDER_SITE_OTHER): Payer: No Typology Code available for payment source | Admitting: Family Medicine

## 2016-07-01 ENCOUNTER — Encounter: Payer: Self-pay | Admitting: Family Medicine

## 2016-07-01 VITALS — BP 110/80 | HR 96 | Temp 98.2°F | Wt 166.2 lb

## 2016-07-01 DIAGNOSIS — S82891S Other fracture of right lower leg, sequela: Secondary | ICD-10-CM

## 2016-07-01 DIAGNOSIS — F4310 Post-traumatic stress disorder, unspecified: Secondary | ICD-10-CM

## 2016-07-01 DIAGNOSIS — G90521 Complex regional pain syndrome I of right lower limb: Secondary | ICD-10-CM

## 2016-07-01 MED ORDER — NORTRIPTYLINE HCL 50 MG PO CAPS: 100.0000 mg | ORAL_CAPSULE | Freq: Every day | ORAL | 5 refills | Status: DC
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
## 2016-07-01 NOTE — Progress Notes (Signed)
Pre visit review using our clinic review tool, if applicable. No additional management support is needed unless otherwise documented below in the visit note. 

## 2016-07-03 ENCOUNTER — Telehealth: Payer: Self-pay | Admitting: Family Medicine

## 2016-07-03 NOTE — Telephone Encounter (Signed)
Last filled on 06/03/16 #30, pt saw Dr. Lorelei Pont on 9/08. Ok to refill?

## 2016-07-08 NOTE — Telephone Encounter (Signed)
Do you know anything about this medication?

## 2016-07-08 NOTE — Telephone Encounter (Signed)
Pt left v/m requesting status of valium refill. Did you refill this approved med on 07/03/16? rx was printed.

## 2016-07-08 NOTE — Telephone Encounter (Signed)
CALLED IN TO PHARMACY. PT AWARE.

## 2016-07-08 NOTE — Telephone Encounter (Signed)
I and Dr Deborra Medina were out of the office that day. It may show printed but would have to have been called in to the pharmacy. I havent received any additional request to have it filled

## 2016-08-03 ENCOUNTER — Encounter: Payer: Self-pay | Admitting: *Deleted

## 2016-08-03 ENCOUNTER — Telehealth: Payer: Self-pay

## 2016-08-03 NOTE — Telephone Encounter (Signed)
Pt request letter that Butch Penny gets ready for pt each month; pt said Butch Penny will know what she needs.

## 2016-08-03 NOTE — Telephone Encounter (Signed)
Letter written and [facility] notified letter is ready to be picked up at the front desk.

## 2016-08-06 ENCOUNTER — Telehealth: Payer: Self-pay

## 2016-08-06 NOTE — Telephone Encounter (Signed)
All noted.  
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
## 2016-08-06 NOTE — Telephone Encounter (Signed)
She was previously on 50 mg of the same medication (Nortryptiline) and tolerated this ok.   I would drop the dose down to 1 pill at night time to the 50 mg dosage.  I would not stop it completely - I suspect that her pain would increase significantly.
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
## 2016-08-10 ENCOUNTER — Other Ambulatory Visit: Payer: Self-pay | Admitting: Family Medicine

## 2016-08-11 ENCOUNTER — Other Ambulatory Visit: Payer: Self-pay | Admitting: Family Medicine

## 2016-08-11 NOTE — Telephone Encounter (Signed)
Rx called in to requested pharmacy 

## 2016-08-11 NOTE — Telephone Encounter (Signed)
Last f/u 02/2016 

## 2016-08-21 ENCOUNTER — Other Ambulatory Visit: Payer: Self-pay | Admitting: Obstetrics and Gynecology

## 2016-08-21 DIAGNOSIS — Z1231 Encounter for screening mammogram for malignant neoplasm of breast: Secondary | ICD-10-CM

## 2016-09-02 ENCOUNTER — Encounter: Payer: Self-pay | Admitting: Family Medicine

## 2016-09-02 ENCOUNTER — Encounter: Payer: Self-pay | Admitting: *Deleted

## 2016-09-02 ENCOUNTER — Ambulatory Visit (INDEPENDENT_AMBULATORY_CARE_PROVIDER_SITE_OTHER): Payer: Self-pay | Admitting: Family Medicine

## 2016-09-02 VITALS — BP 122/75 | HR 121 | Temp 98.5°F | Ht 64.0 in | Wt 172.5 lb

## 2016-09-02 DIAGNOSIS — G90521 Complex regional pain syndrome I of right lower limb: Secondary | ICD-10-CM

## 2016-09-02 DIAGNOSIS — F4310 Post-traumatic stress disorder, unspecified: Secondary | ICD-10-CM

## 2016-09-02 DIAGNOSIS — F411 Generalized anxiety disorder: Secondary | ICD-10-CM

## 2016-09-02 DIAGNOSIS — M654 Radial styloid tenosynovitis [de Quervain]: Secondary | ICD-10-CM

## 2016-09-02 MED ORDER — TRIAMCINOLONE ACETONIDE 0.1 % EX CREA: 1.0000 "application " | TOPICAL_CREAM | Freq: Two times a day (BID) | CUTANEOUS | 1 refills | Status: DC

## 2016-09-02 MED ORDER — FLUOXETINE HCL 20 MG PO CAPS: 20.0000 mg | ORAL_CAPSULE | Freq: Every day | ORAL | 3 refills | Status: DC
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
## 2016-09-02 NOTE — Progress Notes (Signed)
Pre visit review using our clinic review tool, if applicable. No additional management support is needed unless otherwise documented below in the visit note. 

## 2016-09-03 DIAGNOSIS — G90521 Complex regional pain syndrome I of right lower limb: Secondary | ICD-10-CM | POA: Insufficient documentation

## 2016-09-14 ENCOUNTER — Other Ambulatory Visit: Payer: Self-pay | Admitting: Family Medicine

## 2016-09-14 NOTE — Telephone Encounter (Signed)
At this point, I would like to retransition her care towards her PCP - particularly for this.

## 2016-09-14 NOTE — Telephone Encounter (Signed)
Ok to refill but she needs to schedule a follow up with me soon to monitor.

## 2016-09-16 ENCOUNTER — Telehealth: Payer: Self-pay

## 2016-09-16 NOTE — Telephone Encounter (Signed)
Last f/u 02/2016 

## 2016-09-16 NOTE — Telephone Encounter (Signed)
Rx called in to requested pharmacy 

## 2016-09-16 NOTE — Telephone Encounter (Signed)
Patient left VM on triage about refill for Diazepam. Called to let patient know it has been sent in. Patient verbalized understanding.

## 2016-09-24 ENCOUNTER — Ambulatory Visit (HOSPITAL_COMMUNITY)
Admission: RE | Admit: 2016-09-24 | Discharge: 2016-09-24 | Disposition: A | Discharge status: Home / Self Care | Payer: Self-pay | Source: Ambulatory Visit | Attending: Obstetrics and Gynecology | Admitting: Obstetrics and Gynecology

## 2016-09-24 ENCOUNTER — Ambulatory Visit
Admission: RE | Admit: 2016-09-24 | Discharge: 2016-09-24 | Disposition: A | Discharge status: Home / Self Care | Payer: No Typology Code available for payment source | Source: Ambulatory Visit | Attending: Obstetrics and Gynecology | Admitting: Obstetrics and Gynecology

## 2016-09-24 ENCOUNTER — Encounter (HOSPITAL_COMMUNITY): Payer: Self-pay

## 2016-09-24 VITALS — BP 108/70 | Temp 98.1°F | Ht 64.0 in | Wt 168.6 lb

## 2016-09-24 DIAGNOSIS — Z1231 Encounter for screening mammogram for malignant neoplasm of breast: Secondary | ICD-10-CM

## 2016-09-24 DIAGNOSIS — Z1239 Encounter for other screening for malignant neoplasm of breast: Secondary | ICD-10-CM

## 2016-09-24 NOTE — Patient Instructions (Signed)
Explained breast self awareness to [employer]. Patient did not need a Pap smear today due to patient has a history of a hysterectomy for benign reasons. Let patient know that she doesn't need any further Pap smears due to her history of a hysterectomy for benign reasons. Referred patient to the [facility] for a screening mammogram. Appointment scheduled for Thursday, September 24, 2016 at 1410.  Let patient know the [facility] will follow up with her within the next couple weeks with results of mammogram by letter or phone. Oletta Darter Klayman verbalized understanding.  Lu Paradise, Arvil Chaco, RN 3:38 PM

## 2016-09-24 NOTE — Progress Notes (Signed)
Complaints of left breast rash x 17 months that patient has been seen by a Dermatologist in the past and has a prescription she is using.  Pap Smear: Pap smear not completed today. Last Pap smear was in 2015 at [facility] and normal per patient. Per patient has a history of a normal Pap smear with positive HPV in 2012. Patient has a history of a hysterectomy 04/20/2005 due to endometriosis and pelvic pain. Patient no longer needs Pap smears due to her history of a hysterectomy for benign reasons per BCCCP and ACOG guidelines. Last Pap smear result is not in EPIC but previous result on 09/07/2012 is in EPIC.   Physical exam: Breasts Breasts symmetrical. No skin abnormalities right breast. Rash observed on left inner breast that appears to be more reddened due to patient itching it. Patient has been seen by a Dermatologist in the past and has a prescription cream she is using. No nipple retraction bilateral breasts. No nipple discharge bilateral breasts. No lymphadenopathy. No lumps palpated bilateral breasts. No complaints of pain or tenderness on exam. Referred patient to the [facility] for a screening mammogram. Appointment scheduled for Thursday, September 24, 2016 at 1410.        Pelvic/Bimanual No Pap smear completed today since patient has a history of a hysterectomy for benign reasons. Pap smear not indicated per BCCCP guidelines.   Smoking History: Patient has never smoked.  Patient Navigation: Patient education provided. Access to services provided for patient through [facility] program.   Colorectal Cancer Screening: Per patient had a colonoscopy completed in March 2016. No complaints today.

## 2016-09-25 ENCOUNTER — Encounter (HOSPITAL_COMMUNITY): Payer: Self-pay | Admitting: *Deleted

## 2016-11-04 ENCOUNTER — Ambulatory Visit: Payer: Self-pay | Admitting: Family Medicine

## 2016-11-25 ENCOUNTER — Ambulatory Visit: Payer: Self-pay | Admitting: Family Medicine

## 2016-12-02 ENCOUNTER — Other Ambulatory Visit: Payer: Self-pay | Admitting: Gastroenterology

## 2016-12-08 ENCOUNTER — Encounter: Payer: Self-pay | Admitting: Family Medicine

## 2016-12-08 ENCOUNTER — Ambulatory Visit (INDEPENDENT_AMBULATORY_CARE_PROVIDER_SITE_OTHER): Payer: Self-pay | Admitting: Family Medicine

## 2016-12-08 VITALS — BP 122/68 | HR 83 | Temp 98.2°F | Wt 172.5 lb

## 2016-12-08 DIAGNOSIS — J40 Bronchitis, not specified as acute or chronic: Secondary | ICD-10-CM

## 2016-12-08 MED ORDER — AZITHROMYCIN 250 MG PO TABS
ORAL_TABLET | ORAL | 0 refills | Status: DC
Start: 2016-12-08 — End: 2017-01-12

## 2016-12-08 MED ORDER — PROMETHAZINE-CODEINE 6.25-10 MG/5ML PO SYRP: 5.0000 mL | ORAL_SOLUTION | Freq: Four times a day (QID) | ORAL | 0 refills | Status: DC | PRN

## 2016-12-08 NOTE — Progress Notes (Signed)
Pre visit review using our clinic review tool, if applicable. No additional management support is needed unless otherwise documented below in the visit note. 

## 2016-12-08 NOTE — Progress Notes (Signed)
SUBJECTIVE:  Danielle Strickland is a 51 y.o. female who complains of coryza, congestion, dry cough and bilateral sinus pain for 10 days. She denies a history of anorexia and chest pain and denies a history of asthma. Patient denies smoke cigarettes.   Current Outpatient Prescriptions on File Prior to Visit  Medication Sig Dispense Refill  . acyclovir (ZOVIRAX) 400 MG tablet Take 400 mg by mouth daily as needed (for outbreaks).    . SUMAtriptan (IMITREX) 50 MG tablet Take 50 mg by mouth every 2 (two) hours as needed for migraine.      No current facility-administered medications on file prior to visit.     Allergies  Allergen Reactions  . Augmentin [Amoxicillin-Pot Clavulanate] Nausea And Vomiting and Other (See Comments)    Has patient had a PCN reaction causing immediate rash, facial/tongue/throat swelling, SOB or lightheadedness with hypotension: No Has patient had a PCN reaction causing severe rash involving mucus membranes or skin necrosis: No Has patient had a PCN reaction that required hospitalization No Has patient had a PCN reaction occurring within the last 10 years: No If all of the above answers are "NO", then may proceed with Cephalosporin use.  . Codeine Nausea And Vomiting  . Latex Rash  . Aspirin Nausea And Vomiting  . Neosporin [Neomycin-Bacitracin Zn-Polymyx]     Past Medical History:  Diagnosis Date  . Abnormal Pap smear of vagina and vaginal HPV    ascus   . Allergy   . Endometriosis   . GERD (gastroesophageal reflux disease)   . 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 History of migraine   . HPV in female   . IBS (irritable bowel syndrome)   . Liver disease    H/O hepatitis  . Mental disorder    anxiety  . Microscopic hematuria   . Migraines   . Peptic ulcer disease   . Skin cancer    left thigh    Past Surgical History:  Procedure Laterality Date  . ABDOMINAL HYSTERECTOMY    . APPENDECTOMY    . CHOLECYSTECTOMY N/A 11/26/2015   Procedure: LAPAROSCOPIC CHOLECYSTECTOMY ;  Surgeon: Jules Husbands, MD;  Location: [facility] ORS;  Service: General;  Laterality: N/A;  . laparoscopically assisted vaginal hysterectomy  2006  . SALIVARY GLAND SURGERY    . submandular    . TUBAL LIGATION    . WISDOM TOOTH EXTRACTION      Family History  Problem Relation Age of Onset  . Cancer Mother     colon   . Breast cancer Maternal Grandmother   . Thyroid cancer Maternal Aunt   . Breast cancer Maternal Aunt   . Cancer Paternal Aunt     colon    Social History   Social History  . Marital status: Legally Separated    Spouse name: N/A  . Number of children: N/A  . Years of education: N/A   Occupational History  . Not on file.   Social History Main Topics  . Smoking status: Former Smoker    Packs/day: 1.00    Types: Cigarettes    Quit date: 03/03/2015  . Smokeless tobacco: Never Used     Comment: Quit May 8  . Alcohol use No  . Drug use: No  . Sexual activity: Not Currently    Birth control/ protection: Surgical     Comment: hyst/BTL    Other Topics Concern  . Not on file   Social History Narrative  [id number]   Married to fourth husband   The PMH, PSH, Social History, Family History, Medications, and allergies have been reviewed in [facility], and have been updated if relevant.  OBJECTIVE: BP 122/68   Pulse 83   Temp 98.2 F (36.8 C) (Oral)   Wt 172 lb 8 oz (78.2 kg)   SpO2 98%   BMI 29.61 kg/m   She appears well, vital signs are as noted. Ears normal.  Throat and pharynx normal.  Neck supple. No adenopathy in the neck. Nose is congested. Sinuses non tender. Right sided wheezes.  ASSESSMENT:  Bronchitis  PLAN: Given duration and progression of symptoms, will treat for bacterial process a zpack and promethazine with codeine as needed for cough- pt states she is not allergic to this.  Discussed sedation precautions.  Symptomatic therapy suggested: push fluids, rest and return office visit prn if symptoms persist or  worsen.Call or return to clinic prn if these symptoms worsen or fail to improve as anticipated.

## 2016-12-14 ENCOUNTER — Telehealth: Payer: Self-pay | Admitting: Family Medicine

## 2016-12-14 NOTE — Telephone Encounter (Signed)
Please call pt to discuss and get more information about her symptoms.

## 2016-12-14 NOTE — Telephone Encounter (Signed)
Pt called because she was in last week for bronchitis.  She says her cough has stopped but she is still having trouble breathing.  She would like to know what to do.  Can you please call her.

## 2016-12-15 MED ORDER — ALBUTEROL SULFATE HFA 108 (90 BASE) MCG/ACT IN AERS: 2.0000 | INHALATION_SPRAY | Freq: Four times a day (QID) | RESPIRATORY_TRACT | 0 refills | Status: DC | PRN

## 2016-12-15 NOTE — Telephone Encounter (Signed)
Cough and SOB can linger.  I am glad symptoms are improving.  Please continue to keep [place] updated.

## 2016-12-15 NOTE — Telephone Encounter (Signed)
proair eRx sent.

## 2016-12-15 NOTE — Telephone Encounter (Signed)
Spoke to pt who states she is wanting Tx for the dyspnea; perhaps an inhaler. She states that asthma was discuss but she was not prescribed anything

## 2016-12-15 NOTE — Telephone Encounter (Signed)
Pt states she is having some SHoB although cough has almost subsided

## 2016-12-16 NOTE — Telephone Encounter (Signed)
Left detailed message.   

## 2017-01-04 ENCOUNTER — Ambulatory Visit: Payer: Self-pay | Admitting: Gastroenterology

## 2017-01-07 ENCOUNTER — Ambulatory Visit (INDEPENDENT_AMBULATORY_CARE_PROVIDER_SITE_OTHER): Payer: Self-pay | Admitting: Gastroenterology

## 2017-01-07 ENCOUNTER — Encounter: Payer: Self-pay | Admitting: Gastroenterology

## 2017-01-07 VITALS — BP 127/63 | Temp 97.0°F | Ht 64.0 in | Wt 173.0 lb

## 2017-01-07 DIAGNOSIS — R635 Abnormal weight gain: Secondary | ICD-10-CM

## 2017-01-07 DIAGNOSIS — K219 Gastro-esophageal reflux disease without esophagitis: Secondary | ICD-10-CM

## 2017-01-07 MED ORDER — PANTOPRAZOLE SODIUM 40 MG PO TBEC: 40.0000 mg | DELAYED_RELEASE_TABLET | Freq: Every day | ORAL | 11 refills | Status: DC

## 2017-01-07 NOTE — Progress Notes (Signed)
Primary Care Physician: Danielle Norris, MD  Primary Gastroenterologist:  Danielle Strickland Strickland  Chief Complaint  Patient presents with  . Medication Refill  . Weight Gain    HPI: Danielle Strickland Strickland is a 51 y.o. female here for follow-up of her heartburn. The patient was in need of a refill of her Protonix. The patient states that her Protonix is helping her symptoms. The patient does have multiple other questions today. The patient states that since she has had her gallbladder out she has the feeling of her stomach being up in her chest. She also reports that she has a craving for sweets and has gained weight. She also states that she does not eat very much but continues to gain weight. The patient also reports that since having her gallbladder out she does not like the smell of coffee anymore area the patient has also avoided fatty foods and greasy foods can she states that sometimes it will give her symptoms consistent with the pain she had when she had her gallbladder taken out. Patient also reports that her [place] worked very well for her but her insurance does not cover it and it was too expensive for her. Now taking Dulcolax for her constipation. He reports that she does not take it until she gets constipated and then will take 5 at a time to have a bowel movement.  Current Outpatient Prescriptions  Medication Sig Dispense Refill  . acyclovir (ZOVIRAX) 400 MG tablet Take 400 mg by mouth daily as needed (for outbreaks).    [name] albuterol (PROVENTIL HFA;VENTOLIN HFA) 108 (90 Base) MCG/ACT inhaler Inhale 2 puffs into the lungs every 6 (six) hours as needed. 1 Inhaler 0  . azithromycin (ZITHROMAX) 250 MG tablet 2 tabs by mouth on day 1 followed by 1 tab by mouth daily days 2-5 6 tablet 0  . pantoprazole (PROTONIX) 40 MG tablet Take 1 tablet (40 mg total) by mouth daily. 30 tablet 11  . promethazine-codeine (PHENERGAN WITH CODEINE) 6.25-10 MG/5ML syrup Take 5 mLs by mouth every 6 (six) hours as needed for  cough. 120 mL 0  . SUMAtriptan (IMITREX) 50 MG tablet Take 50 mg by mouth every 2 (two) hours as needed for migraine.      No current facility-administered medications for this visit.     Allergies as of 01/07/2017 - Review Complete 01/07/2017  Allergen Reaction Noted  . Augmentin [amoxicillin-pot clavulanate] Nausea And Vomiting and Other (See Comments) 09/07/2012  . Codeine Nausea And Vomiting 09/07/2012  . Latex Rash 09/07/2012  . Aspirin Nausea And Vomiting 11/25/2015  . Neosporin [neomycin-bacitracin zn-polymyx]  09/24/2016    ROS:  General: Negative for anorexia, weight loss, fever, chills, fatigue, weakness. ENT: Negative for hoarseness, difficulty swallowing , nasal congestion. CV: Negative for chest pain, angina, palpitations, dyspnea on exertion, peripheral edema.  Respiratory: Negative for dyspnea at rest, dyspnea on exertion, cough, sputum, wheezing.  GI: See history of present illness. GU:  Negative for dysuria, hematuria, urinary incontinence, urinary frequency, nocturnal urination.  Endo: Negative for unusual weight change.    Physical Examination:   BP 127/63   Temp 97 F (36.1 C) (Oral)   Ht 5\' 4"  (1.626 m)   Wt 173 lb (78.5 kg)   BMI 29.70 kg/m   General: Well-nourished, well-developed in no acute distress.  Eyes: No icterus. Conjunctivae pink. Extremities: No lower extremity edema. No clubbing or deformities. Neuro: Alert and oriented x 3.  Grossly intact. Skin: Warm and dry, no jaundice.  Psych: Alert and cooperative, normal mood and affect.  Labs:    Imaging Studies: No results found.  Assessment and Plan:   Danielle Strickland Strickland is a 51 y.o. y/o female who comes in today with a history of heartburn and her Protonix will be refilled. The patient has also been given samples of lens S which has worked for her the past. The patient has been told to take something on a daily basis so that she doesn't have to take 5 laxatives at a time to have a bowel  movement. The patient has also been explained that her weight gain is likely due to the craving she has for sweets despite her reporting that she doesn't eat very much. The patient has been told to try to cut back on her suite intake and see if that helps with her weight gain. The patient has been explained the plan and agrees with it.    Lucilla Lame, MD. [name]   Note: This dictation was prepared with Dragon dictation along with smaller phrase technology. Any transcriptional errors that result from this process are unintentional.

## 2017-01-12 ENCOUNTER — Ambulatory Visit (INDEPENDENT_AMBULATORY_CARE_PROVIDER_SITE_OTHER)
Admission: RE | Admit: 2017-01-12 | Discharge: 2017-01-12 | Disposition: A | Discharge status: Home / Self Care | Payer: Self-pay | Source: Ambulatory Visit | Attending: Family Medicine | Admitting: Family Medicine

## 2017-01-12 ENCOUNTER — Ambulatory Visit (INDEPENDENT_AMBULATORY_CARE_PROVIDER_SITE_OTHER): Payer: Self-pay | Admitting: Family Medicine

## 2017-01-12 ENCOUNTER — Encounter: Payer: Self-pay | Admitting: Family Medicine

## 2017-01-12 VITALS — BP 118/82 | HR 86 | Temp 97.6°F | Ht 64.0 in | Wt 171.0 lb

## 2017-01-12 DIAGNOSIS — F411 Generalized anxiety disorder: Secondary | ICD-10-CM

## 2017-01-12 DIAGNOSIS — F4310 Post-traumatic stress disorder, unspecified: Secondary | ICD-10-CM

## 2017-01-12 DIAGNOSIS — R0602 Shortness of breath: Secondary | ICD-10-CM

## 2017-01-12 LAB — COMPREHENSIVE METABOLIC PANEL
ALBUMIN: 4.5 g/dL (ref 3.5–5.2)
ALK PHOS: 108 U/L (ref 39–117)
ALT: 16 U/L (ref 0–35)
AST: 18 U/L (ref 0–37)
BUN: 8 mg/dL (ref 6–23)
CO2: 28 mEq/L (ref 19–32)
Calcium: 9.8 mg/dL (ref 8.4–10.5)
Chloride: 103 mEq/L (ref 96–112)
Creatinine, Ser: 0.64 mg/dL (ref 0.40–1.20)
GFR: 103.11 mL/min (ref >60.00)
Glucose, Bld: 94 mg/dL (ref 70–99)
Potassium: 3.7 mEq/L (ref 3.5–5.1)
Sodium: 138 mEq/L (ref 135–145)
TOTAL PROTEIN: 7.9 g/dL (ref 6.0–8.3)
Total Bilirubin: 0.3 mg/dL (ref 0.2–1.2)

## 2017-01-12 LAB — CBC WITH DIFFERENTIAL/PLATELET
BASOS PCT: 0.5 % (ref 0.0–3.0)
Basophils Absolute: 0 10*3/uL (ref 0.0–0.1)
EOS PCT: 1.9 % (ref 0.0–5.0)
Eosinophils Absolute: 0.1 10*3/uL (ref 0.0–0.7)
HCT: 39.1 % (ref 36.0–46.0)
HEMOGLOBIN: 13.4 g/dL (ref 12.0–15.0)
LYMPHS ABS: 2 10*3/uL (ref 0.7–4.0)
Lymphocytes Relative: 39.6 % (ref 12.0–46.0)
MCHC: 34.2 g/dL (ref 30.0–36.0)
MCV: 93.7 fl (ref 78.0–100.0)
MONOS PCT: 6.9 % (ref 3.0–12.0)
Monocytes Absolute: 0.4 10*3/uL (ref 0.1–1.0)
Neutro Abs: 2.6 10*3/uL (ref 1.4–7.7)
Neutrophils Relative %: 51.1 % (ref 43.0–77.0)
Platelets: 323 10*3/uL (ref 150.0–400.0)
RBC: 4.18 Mil/uL (ref 3.87–5.11)
RDW: 13.3 % (ref 11.5–15.5)
WBC: 5.1 10*3/uL (ref 4.0–10.5)

## 2017-01-12 LAB — VITAMIN D 25 HYDROXY (VIT D DEFICIENCY, FRACTURES): VITD: 13.58 ng/mL — AB (ref 30.00–100.00)

## 2017-01-12 LAB — TSH: TSH: 2.73 u[IU]/mL (ref 0.35–4.50)

## 2017-01-12 LAB — VITAMIN B12: VITAMIN B 12: 291 pg/mL (ref 211–911)

## 2017-01-12 MED ORDER — NORTRIPTYLINE HCL 50 MG PO CAPS: 100.0000 mg | ORAL_CAPSULE | Freq: Every day | ORAL | 5 refills | Status: DC

## 2017-01-12 MED ORDER — FLUOXETINE HCL 20 MG PO CAPS: 20.0000 mg | ORAL_CAPSULE | Freq: Every day | ORAL | 3 refills | Status: DC

## 2017-01-12 NOTE — Patient Instructions (Addendum)
Great to see you.  Please STOP taking paxil.  Restart Prozac, nortriptyline.  I will call you with your lab and xray results.
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
## 2017-01-12 NOTE — Assessment & Plan Note (Signed)
>  25 minutes spent in face to face time with patient, >50% spent in counselling or coordination of care discussing SOB and anxiety. I suspect her SOB is multifactorial- anxiety being a big factor. Exam reassuring. Will get CXR, TSH, CMET and CBC today to rule out other possible causes. D/c paxil- not necessary to wean as she just started this and it is a low. Restart prozac and nortriptyline. The patient indicates understanding of these issues and agrees with the plan.  Orders Placed This Encounter  Procedures  . DG Chest 2 View    Standing Status:   Future    Number of Occurrences:   1    Standing Expiration Date:   03/14/2018    Order Specific Question:   Reason for Exam (SYMPTOM  OR DIAGNOSIS REQUIRED)    Answer:   SOB    Order Specific Question:   Is patient pregnant?    Answer:   No    Order Specific Question:   Preferred imaging location?    Answer:   [facility]  . CBC with Differential/Platelet  . Comprehensive metabolic panel  . TSH  . Vitamin B12  . Vitamin D, 25-hydroxy

## 2017-01-13 ENCOUNTER — Other Ambulatory Visit: Payer: Self-pay | Admitting: Family Medicine

## 2017-01-13 MED ORDER — VITAMIN D3 1.25 MG (50000 UT) PO TABS: 50000.0000 | ORAL_TABLET | ORAL | 0 refills | Status: AC
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
## 2017-04-08 ENCOUNTER — Telehealth: Payer: Self-pay | Admitting: Family Medicine

## 2017-04-08 NOTE — Telephone Encounter (Signed)
Noted, will evaluate. 

## 2017-04-08 NOTE — Telephone Encounter (Signed)
Pt has appt with Allie Bossier NP on 04/09/17 at [facility].

## 2017-04-08 NOTE — Telephone Encounter (Signed)
[facility]  Patient Name: Danielle Strickland  DOB: 05/27/1966    Initial Comment Caller states has bronchitis, asking if dr can call in te same meds as when she had this before   Nurse Assessment  Nurse: Harlow Mares, RN, Suanne Marker Date/Time (Eastern Time): 04/08/2017 9:28:08 AM  Confirm and document reason for call. If symptomatic, describe symptoms. ---Caller states has bronchitis, asking if dr can call in the same meds as when she had this before. Reports that she had bronchitis a couple of months ago. Reports that she has gums and face hurt since yesterday morning, moved into her chest. Chest is burning a bit and coughing. She took a Zpak and codeine cough medicine. She cannot afford a MD visit at this time.  Does the patient have any new or worsening symptoms? ---Yes  Will a triage be completed? ---Yes  Related visit to physician within the last 2 weeks? ---No  Does the PT have any chronic conditions? (i.e. diabetes, asthma, etc.) ---No  Is the patient pregnant or possibly pregnant? (Ask all females between the ages of 42-55) ---No  Is this a behavioral health or substance abuse call? ---No     Guidelines    Guideline Title Affirmed Question Affirmed Notes  Cough - Acute Non-Productive SEVERE coughing spells (e.g., whooping sound after coughing, vomiting after coughing)    Final Disposition User   See Physician within 24 Hours Harlow Mares, RN, Suanne Marker    Comments  Appt scheduled for 6/15 @ 10am with Alma Friendly at the [facility] location.   Referrals  REFERRED TO PCP OFFICE   Disagree/Comply: Comply

## 2017-04-09 ENCOUNTER — Ambulatory Visit (INDEPENDENT_AMBULATORY_CARE_PROVIDER_SITE_OTHER): Payer: Self-pay | Admitting: Primary Care

## 2017-04-09 VITALS — BP 120/84 | HR 112 | Temp 99.2°F | Ht 64.0 in | Wt 175.8 lb

## 2017-04-09 DIAGNOSIS — J069 Acute upper respiratory infection, unspecified: Secondary | ICD-10-CM

## 2017-04-09 MED ORDER — FLUTICASONE PROPIONATE 50 MCG/ACT NA SUSP
1.0000 | Freq: Two times a day (BID) | NASAL | 0 refills | Status: DC
Start: 2017-04-09 — End: 2017-06-24

## 2017-04-09 MED ORDER — PROMETHAZINE-CODEINE 6.25-10 MG/5ML PO SYRP: 5.0000 mL | ORAL_SOLUTION | Freq: Three times a day (TID) | ORAL | 0 refills | Status: DC | PRN

## 2017-04-09 NOTE — Patient Instructions (Signed)
Your symptoms are representative of a viral illness which will resolve on its own over time. Our goal is to treat your symptoms in order to aid your body in the healing process and to make you more comfortable.   Nasal Congestion/Ear Pressure: Try using Flonase (fluticasone) nasal spray. Instill 1 spray in each nostril twice daily.   You may take the promethazine with codeine cough suppressant every 8 hours or at bedtime as needed for cough and rest. Caution this medication contains codeine and will make you feel drowsy.  Continue Ibuprofen. Take 600 mg every 8 hours as needed for pain and inflammation.  It was a pleasure meeting you!

## 2017-04-09 NOTE — Progress Notes (Signed)
Subjective:    Patient ID: [name], female    DOB: [birth year], 51 y.o.   MRN: [id number]  HPI  Ms. Mathia is a 51 year old female who presents today with a chief complaint of cough. She also reports fatigue, nasal congestion, sore throat, congestion, right maxillary pressure, tooth and gum pain. Her symptoms began 2 days ago. She has been exposed to pneumonia, bronchitis, and URI's over this week at church. She's taken Ibuprofen with some improvement and is feeling better than yesterday.   Review of Systems  Constitutional: Positive for fatigue and fever.  HENT: Positive for congestion, ear pain, sinus pressure and sore throat.   Respiratory: Positive for cough. Negative for shortness of breath and wheezing.   Cardiovascular: Negative for chest pain.       Past Medical History:  Diagnosis Date  . Abnormal Pap smear of vagina and vaginal HPV    ascus   . Allergy   . Endometriosis   . GERD (gastroesophageal reflux disease)   . 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 History of migraine   . HPV in female   . IBS (irritable bowel syndrome)   . Liver disease    H/O hepatitis  . Mental disorder    anxiety  . Microscopic hematuria   . Migraines   . Peptic ulcer disease   . Skin cancer    left thigh     Social History   Social History  . Marital status: Legally Separated    Spouse name: N/A  . Number of children: N/A  . Years of education: N/A   Occupational History  . Not on file.   Social History Main Topics  . Smoking status: Former Smoker    Packs/day: 1.00    Types: Cigarettes    Quit date: 03/03/2015  . Smokeless tobacco: Never Used     Comment: Quit May 8  . Alcohol use No  . Drug use: No  . Sexual activity: Not Currently    Birth control/ protection: Surgical     Comment: hyst/BTL    Other Topics Concern  . Not on file   Social History Narrative   [phone]   Married to fourth husband    Past Surgical History:  Procedure  Laterality Date  . ABDOMINAL HYSTERECTOMY    . APPENDECTOMY    . CHOLECYSTECTOMY N/A 11/26/2015   Procedure: LAPAROSCOPIC CHOLECYSTECTOMY ;  Surgeon: Jules Husbands, MD;  Location: [facility] ORS;  Service: General;  Laterality: N/A;  . laparoscopically assisted vaginal hysterectomy  2006  . SALIVARY GLAND SURGERY    . submandular    . TUBAL LIGATION    . WISDOM TOOTH EXTRACTION      Family History  Problem Relation Age of Onset  . Cancer Mother        colon   . Breast cancer Maternal Grandmother   . Thyroid cancer Maternal Aunt   . Breast cancer Maternal Aunt   . Cancer Paternal Aunt        colon    Allergies  Allergen Reactions  . Augmentin [Amoxicillin-Pot Clavulanate] Nausea And Vomiting and Other (See Comments)    Has patient had a PCN reaction causing immediate rash, facial/tongue/throat swelling, SOB or lightheadedness with hypotension: No Has patient had a PCN reaction causing severe rash involving mucus membranes or skin necrosis: No Has patient had a PCN reaction that required hospitalization No Has patient had a PCN reaction occurring within  the last 10 years: No If all of the above answers are "NO", then may proceed with Cephalosporin use.  . Codeine Nausea And Vomiting  . Latex Rash  . Aspirin Nausea And Vomiting  . Neosporin [Neomycin-Bacitracin Zn-Polymyx]     Current Outpatient Prescriptions on File Prior to Visit  Medication Sig Dispense Refill  . FLUoxetine (PROZAC) 20 MG capsule Take 1 capsule (20 mg total) by mouth daily. 90 capsule 3  . nortriptyline (PAMELOR) 50 MG capsule Take 2 capsules (100 mg total) by mouth daily. 60 capsule 5  . pantoprazole (PROTONIX) 40 MG tablet Take 1 tablet (40 mg total) by mouth daily. 30 tablet 11  . SUMAtriptan (IMITREX) 50 MG tablet Take 50 mg by mouth every 2 (two) hours as needed for migraine.     [name] acyclovir (ZOVIRAX) 400 MG tablet Take 400 mg by mouth daily as needed (for outbreaks).    [name] albuterol (PROVENTIL HFA;VENTOLIN  HFA) 108 (90 Base) MCG/ACT inhaler Inhale 2 puffs into the lungs every 6 (six) hours as needed. (Patient not taking: Reported on 01/12/2017) 1 Inhaler 0   No current facility-administered medications on file prior to visit.     BP 120/84   Pulse (!) 112   Temp 99.2 F (37.3 C) (Oral)   Ht 5\' 4"  (1.626 m)   Wt 175 lb 12.8 oz (79.7 kg)   SpO2 98%   BMI 30.18 kg/m    Objective:   Physical Exam  Constitutional: She appears well-nourished. She does not appear ill.  HENT:  Right Ear: Tympanic membrane and ear canal normal.  Left Ear: Tympanic membrane and ear canal normal.  Nose: Mucosal edema present. Right sinus exhibits maxillary sinus tenderness. Right sinus exhibits no frontal sinus tenderness. Left sinus exhibits no maxillary sinus tenderness and no frontal sinus tenderness.  Mouth/Throat: Oropharynx is clear and moist.  Eyes: Conjunctivae are normal.  Neck: Neck supple.  Cardiovascular: Normal rate and regular rhythm.   Pulmonary/Chest: Effort normal and breath sounds normal. She has no wheezes. She has no rales.  Dry cough during exam  Lymphadenopathy:    She has no cervical adenopathy.  Skin: Skin is warm and dry.          Assessment & Plan:  URI:  Cough, congestion, low grade fever x 2 days. Exam today with mild maxillary sinus tenderness, otherwise unremarkable exam. Dry cough today. Suspect viral involvement at this point and will treat with conservative measures.  Rx for Flonase, promethazine with codeine (has had in the past without problems, does well for her) provided. Discussed to continue Ibuprofen. Fluids, rest, return precautions provided.  Sheral Flow, NP

## 2017-06-24 ENCOUNTER — Encounter: Payer: Self-pay | Admitting: Family Medicine

## 2017-06-24 ENCOUNTER — Ambulatory Visit (INDEPENDENT_AMBULATORY_CARE_PROVIDER_SITE_OTHER): Payer: Self-pay | Admitting: Family Medicine

## 2017-06-24 VITALS — BP 110/70 | HR 110 | Temp 98.3°F | Ht 64.0 in | Wt 177.8 lb

## 2017-06-24 DIAGNOSIS — M545 Low back pain, unspecified: Secondary | ICD-10-CM

## 2017-06-24 MED ORDER — PREDNISONE 20 MG PO TABS: ORAL_TABLET | ORAL | 0 refills | Status: DC

## 2017-06-24 NOTE — Progress Notes (Signed)
   Dr. Frederico Hamman T. Breyden Jeudy, MD, [facility] [place], [address] Phone: [phone] Fax: [phone]  06/24/2017  Patient: Danielle Strickland, MRN: [id number], DOB: Mar 20, 1966, 51 y.o.  Primary Physician:  Lucille Passy, MD   Chief Complaint  Patient presents with  . Back Pain    Started [place] Neck Back   Subjective:   Danielle Strickland is a 51 y.o. very pleasant female patient who presents with the following:  Started early AM, back pain is ongoing.  No radicular sx and all.  Pain started acutely and the morning, but she did not have any kind of specific trauma or accident.  She does not have any numbness or tingling in her legs or radicular symptoms.  No incontinence.  She doesn't have any significant back history previously.  Right now she is standing up in the examination room and appears in mild to moderate discomfort.  Blood pressure 110/70, pulse (!) 110, temperature 98.3 F (36.8 C), temperature source Oral, height 5\' 4"  (1.626 m), weight 177 lb 12 oz (80.6 kg).  examination shows a 51 year old female who appears stated age and discomfort standing in the examination room.  Well-developed well-nourished.  Alert and oriented.  She is relatively globally tender from L3-S1 bilaterally.  She is also tender at the SI joints in the upper pelvis and buttocks region posteriorly. No central spinous process tenderness.  Straight leg raise is negative.  Sitting and standing, but this does provoke pain in the back, more on the right. Neurovascularly intact.  Assessment: Low back pain. Minor disc movement or herniation cannot be excluded. No concerning red flags.  Plan.  Finances are limited in this case.  I'm going to give the patient a dose of some prednisone, and hopefully this will calm this down acutely.  Acute bilateral low back pain without sciatica   Meds ordered this encounter  Medications  . predniSONE  (DELTASONE) 20 MG tablet    Sig: 2 tabs for 7 days, then 1 tab for 7 days    Dispense:  21 tablet    Refill:  0    Signed,  Katharine Rochefort T. Andric Kerce, MD

## 2017-10-14 ENCOUNTER — Telehealth: Payer: Self-pay | Admitting: Family Medicine
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
## 2017-10-14 NOTE — Telephone Encounter (Signed)
I would really need to see her for this.  Please have her schedule an OV.  And yes I can sign her DMV form without seeing her.
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
## 2017-10-14 NOTE — Telephone Encounter (Signed)
Left vm for the pt to call back, need to inform of massage below and offer an appt with Deborra Medina when she comes back or see another provider for leg swelling/med req. CRM

## 2017-10-14 NOTE — Telephone Encounter (Signed)
Pt called back to check on status of furosemide rx

## 2017-10-15 MED ORDER — FUROSEMIDE 20 MG PO TABS: 20.0000 mg | ORAL_TABLET | Freq: Every day | ORAL | 2 refills | Status: DC | PRN

## 2017-10-15 NOTE — Telephone Encounter (Signed)
There was a communication breakdown here, and 19 touches to this chart before it got to me? The patient says it should come to me in the original message. Can this be discussed with the [facility] and our staff involved?  Cc: Leticia Penna and Ali Lowe.   Please call:  Butch Penny, some prn lasix would be reasonable here for her. I am very happy to sign a handicapped form for her if placed on my desk.   Furosemide 20 mg, 1 po prn LE swelling, #30, 2 refills.

## 2017-10-15 NOTE — Telephone Encounter (Signed)
Spoke with [employer].  I advised that Dr. Lorelei Pont would be happy to send in a Rx for Lasix for her.  Rx sent to [facility] as instructed.  I also let her know that Dr. Lorelei Pont will fill out the handicap placard application for her but it will be next Wednesday before it will be ready.  Application completed and placed in Dr. Lillie Fragmin in box to sign.  She was very thankful to Dr. Lorelei Pont for doing this for her.  She also wanted to make sure Dr. 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

## 2017-10-20 NOTE — Telephone Encounter (Signed)
Left message for Danielle Strickland that her handicap placard application is ready to be picked up at the front desk.

## 2017-10-20 NOTE — Telephone Encounter (Signed)
I checked the folders at the front, but it hasnt been placed in there yet

## 2017-10-20 NOTE — Telephone Encounter (Signed)
Requesting a call to see if handicap placard is ready for pick up.

## 2018-01-25 ENCOUNTER — Other Ambulatory Visit: Payer: Self-pay

## 2018-01-25 ENCOUNTER — Other Ambulatory Visit: Payer: Self-pay | Admitting: Gastroenterology

## 2018-01-25 ENCOUNTER — Telehealth: Payer: Self-pay | Admitting: Gastroenterology

## 2018-01-25 MED ORDER — PANTOPRAZOLE SODIUM 40 MG PO TBEC: 40.0000 mg | DELAYED_RELEASE_TABLET | Freq: Every day | ORAL | 3 refills | Status: DC

## 2018-01-25 NOTE — Telephone Encounter (Signed)
*  STAT* If patient is at the pharmacy, call can be transferred to refill team.   1. Which medications need to be refilled? (please list name of each medication and dose if known) Protonix 40mg   2. Which pharmacy/location (including street and city if local pharmacy) is medication to be sent to? [facility] on [address]  3. Do they need a 30 day or 90 day supply? 30 day

## 2018-02-01 ENCOUNTER — Encounter: Payer: Self-pay | Admitting: Gastroenterology

## 2018-02-01 ENCOUNTER — Ambulatory Visit: Payer: 59 | Admitting: Gastroenterology

## 2018-02-01 VITALS — BP 132/73 | HR 92 | Temp 98.1°F | Ht 64.5 in | Wt 178.2 lb

## 2018-02-01 DIAGNOSIS — K5901 Slow transit constipation: Secondary | ICD-10-CM | POA: Diagnosis not present

## 2018-02-01 DIAGNOSIS — K219 Gastro-esophageal reflux disease without esophagitis: Secondary | ICD-10-CM

## 2018-02-01 DIAGNOSIS — N029 Recurrent and persistent hematuria with unspecified morphologic changes: Secondary | ICD-10-CM | POA: Insufficient documentation

## 2018-02-01 DIAGNOSIS — K759 Inflammatory liver disease, unspecified: Secondary | ICD-10-CM | POA: Insufficient documentation

## 2018-02-01 DIAGNOSIS — N951 Menopausal and female climacteric states: Secondary | ICD-10-CM | POA: Insufficient documentation

## 2018-02-01 DIAGNOSIS — R1013 Epigastric pain: Secondary | ICD-10-CM

## 2018-02-01 DIAGNOSIS — F419 Anxiety disorder, unspecified: Secondary | ICD-10-CM | POA: Insufficient documentation

## 2018-02-01 DIAGNOSIS — G43909 Migraine, unspecified, not intractable, without status migrainosus: Secondary | ICD-10-CM | POA: Insufficient documentation
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
## 2018-02-04 ENCOUNTER — Other Ambulatory Visit: Payer: Self-pay

## 2018-02-04 MED ORDER — PANTOPRAZOLE SODIUM 40 MG PO TBEC: 40.0000 mg | DELAYED_RELEASE_TABLET | Freq: Every day | ORAL | 11 refills | Status: DC

## 2018-02-04 MED ORDER — LINACLOTIDE 145 MCG PO CAPS: 145.0000 ug | ORAL_CAPSULE | Freq: Every day | ORAL | 11 refills | Status: DC

## 2018-02-09 DIAGNOSIS — L308 Other specified dermatitis: Secondary | ICD-10-CM | POA: Diagnosis not present

## 2018-02-09 DIAGNOSIS — D225 Melanocytic nevi of trunk: Secondary | ICD-10-CM | POA: Diagnosis not present

## 2018-02-09 DIAGNOSIS — L821 Other seborrheic keratosis: Secondary | ICD-10-CM | POA: Diagnosis not present

## 2018-04-06 ENCOUNTER — Encounter: Payer: Self-pay | Admitting: Family Medicine

## 2018-04-06 ENCOUNTER — Ambulatory Visit: Payer: Self-pay | Admitting: Family Medicine

## 2018-04-06 ENCOUNTER — Ambulatory Visit (INDEPENDENT_AMBULATORY_CARE_PROVIDER_SITE_OTHER): Payer: 59

## 2018-04-06 ENCOUNTER — Ambulatory Visit: Payer: 59 | Admitting: Family Medicine

## 2018-04-06 ENCOUNTER — Other Ambulatory Visit: Payer: Self-pay

## 2018-04-06 VITALS — BP 110/64 | HR 85 | Temp 98.5°F | Ht 65.0 in | Wt 176.0 lb

## 2018-04-06 DIAGNOSIS — F419 Anxiety disorder, unspecified: Secondary | ICD-10-CM

## 2018-04-06 DIAGNOSIS — G2581 Restless legs syndrome: Secondary | ICD-10-CM

## 2018-04-06 DIAGNOSIS — Z13220 Encounter for screening for lipoid disorders: Secondary | ICD-10-CM | POA: Diagnosis not present

## 2018-04-06 DIAGNOSIS — G90521 Complex regional pain syndrome I of right lower limb: Secondary | ICD-10-CM

## 2018-04-06 DIAGNOSIS — F411 Generalized anxiety disorder: Secondary | ICD-10-CM

## 2018-04-06 DIAGNOSIS — R0602 Shortness of breath: Secondary | ICD-10-CM

## 2018-04-06 LAB — COMPREHENSIVE METABOLIC PANEL
ALK PHOS: 102 U/L (ref 39–117)
ALT: 16 U/L (ref 0–35)
AST: 16 U/L (ref 0–37)
Albumin: 4.4 g/dL (ref 3.5–5.2)
BILIRUBIN TOTAL: 0.4 mg/dL (ref 0.2–1.2)
BUN: 8 mg/dL (ref 6–23)
CO2: 28 meq/L (ref 19–32)
CREATININE: 0.73 mg/dL (ref 0.40–1.20)
Calcium: 9.5 mg/dL (ref 8.4–10.5)
Chloride: 102 mEq/L (ref 96–112)
GFR: 88.17 mL/min (ref >60.00)
GLUCOSE: 110 mg/dL — AB (ref 70–99)
Potassium: 3.6 mEq/L (ref 3.5–5.1)
SODIUM: 139 meq/L (ref 135–145)
TOTAL PROTEIN: 7.7 g/dL (ref 6.0–8.3)

## 2018-04-06 LAB — CBC
HCT: 38.5 % (ref 36.0–46.0)
HEMOGLOBIN: 13.4 g/dL (ref 12.0–15.0)
MCHC: 34.7 g/dL (ref 30.0–36.0)
MCV: 93.4 fl (ref 78.0–100.0)
PLATELETS: 318 10*3/uL (ref 150.0–400.0)
RBC: 4.13 Mil/uL (ref 3.87–5.11)
RDW: 12.9 % (ref 11.5–15.5)
WBC: 5.4 10*3/uL (ref 4.0–10.5)

## 2018-04-06 LAB — LIPID PANEL
Cholesterol: 293 mg/dL — ABNORMAL HIGH (ref 0–200)
HDL: 38.8 mg/dL — AB (ref >39.00)
NonHDL: 254.02
Total CHOL/HDL Ratio: 8
Triglycerides: 357 mg/dL — ABNORMAL HIGH (ref 0.0–149.0)
VLDL: 71.4 mg/dL — ABNORMAL HIGH (ref 0.0–40.0)

## 2018-04-06 LAB — LDL CHOLESTEROL, DIRECT: LDL DIRECT: 179 mg/dL

## 2018-04-06 LAB — TSH: TSH: 4.67 u[IU]/mL — AB (ref 0.35–4.50)

## 2018-04-06 MED ORDER — PREGABALIN 25 MG PO CAPS: 25.0000 mg | ORAL_CAPSULE | Freq: Two times a day (BID) | ORAL | 0 refills | Status: DC

## 2018-04-06 MED ORDER — SUMATRIPTAN SUCCINATE 50 MG PO TABS: 50.0000 mg | ORAL_TABLET | ORAL | 3 refills | Status: DC | PRN

## 2018-04-06 MED ORDER — PROMETHAZINE HCL 25 MG PO TABS: ORAL_TABLET | ORAL | 3 refills | Status: DC

## 2018-04-06 MED ORDER — SERTRALINE HCL 50 MG PO TABS: 50.0000 mg | ORAL_TABLET | Freq: Every day | ORAL | 3 refills | Status: DC

## 2018-04-06 NOTE — Assessment & Plan Note (Signed)
Deteriorated. Lung exam reassuring.  Likely due to anxiety. Will get CXR to reassure her, given duration of symptoms. The patient indicates understanding of these issues and agrees with the plan.
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
## 2018-04-06 NOTE — Assessment & Plan Note (Addendum)
>  40 minutes spent in face to face time with patient, >50% spent in counselling or coordination of care discussing her different symptoms today. In terms of her pain, will try very low dose Lyrica now that she has health insurance again. Will likely need to titrate dose up slowly givent hat medications often cause her to feel nauseated. Call or return to clinic prn if these symptoms worsen or fail to improve as anticipated. The patient indicates understanding of these issues and agrees with the plan.

## 2018-04-06 NOTE — Patient Instructions (Addendum)
Great to see you.  We are Starting  Zoloft 50 mg. Please take 1/2 tablet daily for 10 days, then advance to 1 full tablet thereafter.  We are starting Lyrica 25 mg twice daily.  Please come see me in 1 month.   I will call you with your lab results from today and you can view them online.

## 2018-04-06 NOTE — Assessment & Plan Note (Signed)
Deteriorated. Prozac worked well but made her feel numb. Will try zoloft- Start Zoloft 50 mg. Patient is to take 1/2 tablet daily for 10 days, then advance to 1 full tablet thereafter. We discussed possible side effects of headache, GI upset, drowsiness, and SI/HI.  Patient verbalized understanding.

## 2018-04-08 ENCOUNTER — Telehealth: Payer: Self-pay

## 2018-04-08 DIAGNOSIS — E559 Vitamin D deficiency, unspecified: Secondary | ICD-10-CM

## 2018-04-08 DIAGNOSIS — E785 Hyperlipidemia, unspecified: Secondary | ICD-10-CM

## 2018-04-08 MED ORDER — VITAMIN D (ERGOCALCIFEROL) 1.25 MG (50000 UNIT) PO CAPS: 50000.0000 [IU] | ORAL_CAPSULE | ORAL | 0 refills | Status: DC

## 2018-04-08 MED ORDER — LOVASTATIN 20 MG PO TABS: 20.0000 mg | ORAL_TABLET | Freq: Every day | ORAL | 3 refills | Status: DC

## 2018-04-08 NOTE — Telephone Encounter (Signed)
Let's start simvastatin 20 mg nightly.  #30 with 3 refills Follow up lipid panel, CMET in 8 weeks.    Low lung volumes could be anything- it's non specific, even if she didn't take a deep enough breath or history of previous infections.    I cannot explain her symptoms from the CXR or lab panel.  If she is still feeling bad, I do want her to come in to see someone today or at weekend clinic.  Thyroid was a bit off- I would want to check TSH, FT4 in 8 weeks as well.

## 2018-04-08 NOTE — Addendum Note (Signed)
Addended by: Marrion Coy on: 04/08/2018 02:22 PM   Modules accepted: Orders

## 2018-04-08 NOTE — Telephone Encounter (Signed)
TA-Pt in agreement with low-dose statin/mailed low-cholesterol diet  1.) What medication dose SIG  2.) She is questioning the "low lung volume" on the CXR/she states that she needs to know the "why" of it.  3.) She also wants to know if either her labs or her lungs are the reasons for the cloudiness in OS, dizziness, and cramps in her feet  Plz advise on all 3

## 2018-04-08 NOTE — Telephone Encounter (Signed)
Per TA low lung volumes could be as simple as not taking a deep breath or H/O smoking (which she has) or how sitting or anxiety/nothing on CXR or labs explain her Sx but if she is feeling bad get her in to see someone today/I left this information on her VM  PEC plz schedule her with someone in this office this afternoon if she wishes to do so/thx dmf

## 2018-04-08 NOTE — Telephone Encounter (Signed)
Per discussion with TA ok to change Simvastatin 20mg  to Lovastatin 20mg  as this is on the $4 list at [facility] to send in Vit-Yaiza Palazzola 50k IU 1qwk as she never initiated therapy for this march 2018 when her number was [phone]/I got the Lyrica approved and with the activated copay card that I gave pt she should pay no more than $4 for this monthly/she is in agreement as well/she will have her labs drawn in 8 weeks to check CMP, Lipid, Vit-Norena Bratton/created future orders/thx dmf

## 2018-04-13 ENCOUNTER — Encounter: Payer: Self-pay | Admitting: Family Medicine

## 2018-04-14 ENCOUNTER — Encounter: Payer: Self-pay | Admitting: Family Medicine

## 2018-04-21 ENCOUNTER — Telehealth: Payer: Self-pay

## 2018-04-21 MED ORDER — PREGABALIN 50 MG PO CAPS: 50.0000 mg | ORAL_CAPSULE | Freq: Two times a day (BID) | ORAL | 0 refills | Status: DC

## 2018-04-21 NOTE — Telephone Encounter (Signed)
Yes ok to increase Lyrica to 50 mg twice daily.

## 2018-04-21 NOTE — Addendum Note (Signed)
Addended by: Marrion Coy on: 04/21/2018 04:41 PM   Modules accepted: Orders

## 2018-04-21 NOTE — Telephone Encounter (Signed)
Pt aware to use Lyrica 25mg  2bid and when TA comes in she will sign new Rx for increase to 50mg  bid to be faxed to pharm/thx dmf

## 2018-04-21 NOTE — Telephone Encounter (Signed)
TA-I spoke to pt/she was able to get all of her medications/the only thing is that although the Lyrica is helping some is wondering if this can be increased to the next step/If so I can print it out for you to sign when you come in and just have her take 2 of the 25mg  bid until I get it to the pharmacy/Plz advise/thx dmf   Copied from [place] [id number]. Topic: General - Other >> Apr 15, 2018 12:34 PM Yvette Rack wrote: Reason for CRM: pt would like for Selinda Eon Dr Elonda Husky assistant to give her a call back about the zoloft and Vit Shavaughn Seidl that she was put on please call her at [phone]  >> Apr 15, 2018  3:40 PM Noitamyae, Phetcharat, LPN wrote: Left vm for the pt call back, need more informations.  >> Apr 15, 2018  3:45 PM Noitamyae, Building surveyor, LPN wrote: Spoke with the pt, she request to speak to [place] only. Please give her a call Monday.

## 2018-04-27 ENCOUNTER — Telehealth: Payer: Self-pay | Admitting: Family Medicine

## 2018-04-27 ENCOUNTER — Other Ambulatory Visit: Payer: Self-pay

## 2018-04-27 MED ORDER — LOVASTATIN 20 MG PO TABS: 20.0000 mg | ORAL_TABLET | Freq: Every day | ORAL | 1 refills | Status: DC

## 2018-04-27 NOTE — Telephone Encounter (Unsigned)
Copied from [place] [id number]. Topic: General - Other >> Apr 25, 2018  3:29 PM Marin Olp L wrote: Reason for CRM: Patient would like a call back from Dr. Hulen Shouts cma to discuss her cholesterol medicine. She wanted to speak specifically to [facility] and would not provide any additional info.  >> Apr 27, 2018 11:17 AM Neva Seat wrote: Needing a call back from [facility], Dr. Hulen Shouts CMA. Pt has been waiting for a call back since Monday. Please call pt back today.

## 2018-04-27 NOTE — Telephone Encounter (Signed)
Spoke to pt/sent in statin as 90d to [facility]/thx dmf

## 2018-05-06 ENCOUNTER — Other Ambulatory Visit: Payer: Self-pay

## 2018-05-06 MED ORDER — VITAMIN D (ERGOCALCIFEROL) 1.25 MG (50000 UNIT) PO CAPS: 50000.0000 [IU] | ORAL_CAPSULE | ORAL | 0 refills | Status: AC

## 2018-05-10 ENCOUNTER — Ambulatory Visit (INDEPENDENT_AMBULATORY_CARE_PROVIDER_SITE_OTHER): Payer: 59 | Admitting: Family Medicine

## 2018-05-10 VITALS — BP 112/74 | HR 87 | Temp 98.5°F | Ht 64.0 in | Wt 178.8 lb

## 2018-05-10 DIAGNOSIS — R7989 Other specified abnormal findings of blood chemistry: Secondary | ICD-10-CM | POA: Insufficient documentation

## 2018-05-10 DIAGNOSIS — F411 Generalized anxiety disorder: Secondary | ICD-10-CM | POA: Diagnosis not present

## 2018-05-10 DIAGNOSIS — E7800 Pure hypercholesterolemia, unspecified: Secondary | ICD-10-CM | POA: Insufficient documentation

## 2018-05-10 DIAGNOSIS — E785 Hyperlipidemia, unspecified: Secondary | ICD-10-CM | POA: Diagnosis not present

## 2018-05-10 DIAGNOSIS — Z0000 Encounter for general adult medical examination without abnormal findings: Secondary | ICD-10-CM | POA: Insufficient documentation

## 2018-05-10 DIAGNOSIS — F419 Anxiety disorder, unspecified: Secondary | ICD-10-CM | POA: Diagnosis not present

## 2018-05-10 DIAGNOSIS — G90521 Complex regional pain syndrome I of right lower limb: Secondary | ICD-10-CM

## 2018-05-10 DIAGNOSIS — Z1239 Encounter for other screening for malignant neoplasm of breast: Secondary | ICD-10-CM

## 2018-05-10 DIAGNOSIS — Z1231 Encounter for screening mammogram for malignant neoplasm of breast: Secondary | ICD-10-CM | POA: Diagnosis not present

## 2018-05-10 LAB — COMPREHENSIVE METABOLIC PANEL
ALK PHOS: 93 U/L (ref 39–117)
ALT: 14 U/L (ref 0–35)
AST: 17 U/L (ref 0–37)
Albumin: 4.3 g/dL (ref 3.5–5.2)
BILIRUBIN TOTAL: 0.5 mg/dL (ref 0.2–1.2)
BUN: 9 mg/dL (ref 6–23)
CALCIUM: 9.3 mg/dL (ref 8.4–10.5)
CHLORIDE: 105 meq/L (ref 96–112)
CO2: 28 meq/L (ref 19–32)
Creatinine, Ser: 0.73 mg/dL (ref 0.40–1.20)
GFR: 88.14 mL/min (ref >60.00)
Glucose, Bld: 106 mg/dL — ABNORMAL HIGH (ref 70–99)
Potassium: 4.3 mEq/L (ref 3.5–5.1)
Sodium: 140 mEq/L (ref 135–145)
Total Protein: 7.6 g/dL (ref 6.0–8.3)

## 2018-05-10 LAB — T4, FREE: Free T4: 0.66 ng/dL (ref 0.60–1.60)

## 2018-05-10 LAB — LIPID PANEL
CHOL/HDL RATIO: 5
CHOLESTEROL: 210 mg/dL — AB (ref 0–200)
HDL: 45.1 mg/dL (ref >39.00)
NONHDL: 164.85
TRIGLYCERIDES: 279 mg/dL — AB (ref 0.0–149.0)
VLDL: 55.8 mg/dL — AB (ref 0.0–40.0)

## 2018-05-10 LAB — TSH: TSH: 4.3 u[IU]/mL (ref 0.35–4.50)

## 2018-05-10 LAB — LDL CHOLESTEROL, DIRECT: Direct LDL: 132 mg/dL

## 2018-05-10 MED ORDER — PREGABALIN 75 MG PO CAPS: 75.0000 mg | ORAL_CAPSULE | Freq: Two times a day (BID) | ORAL | 2 refills | Status: DC

## 2018-05-10 NOTE — Assessment & Plan Note (Signed)
Improved with low dose zoloft. No changes made today.

## 2018-05-10 NOTE — Assessment & Plan Note (Signed)
?   Due to HLD. Will recheck TSH and FT4 today.

## 2018-05-10 NOTE — Assessment & Plan Note (Signed)
Started statin last month. Recheck lipid panel and CMET today.

## 2018-05-10 NOTE — Assessment & Plan Note (Signed)
Reviewed preventive care protocols, scheduled due services, and updated immunizations Discussed nutrition, exercise, diet, and healthy lifestyle.  

## 2018-05-10 NOTE — Assessment & Plan Note (Signed)
Improving with Lyrica but still symptomatic. Increase Lyrica to 75 mg twice daily. She will update me in a few weeks.

## 2018-05-10 NOTE — Patient Instructions (Signed)
Great to see you.  We are increasing your Lyrica to 75 mg  twice daily. Please keep me updated.   I will call you with your lab results from today and you can view them online.
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
## 2018-05-19 ENCOUNTER — Telehealth: Payer: Self-pay | Admitting: Family Medicine

## 2018-05-19 NOTE — Telephone Encounter (Signed)
Let's have her follow up after she finishes this next round (6 more weeks), unless she feels she needs to be seen sooner.  How is she feeling?

## 2018-05-19 NOTE — Telephone Encounter (Signed)
Pt call back to see if she still need to take her Vit D 50000, we sent in some more fore her on 05/06/18 for 6 mo weeks. She said she complete the first round already. Last vit d was 01/12/17. Please advise  Pt also wants to know when she suppose to follow up with Dr. Deborra Medina? Please advise.      Copied from [place] [id number]. Topic: General - Other >> May 17, 2018  8:59 AM Keene Breath wrote: Reason for CRM: Patient called to leave a message for the nurse regarding her Vitamin D.  Please call patient back as soon as possible.  CB# [phone] or [phone]

## 2018-05-20 NOTE — Telephone Encounter (Signed)
FYI:  I discussed this with the patient/she chose to go ahead and take the Rx for 6 weeks/I have her scheduled for a fasting lab visit on 9.11.19 @ 8am to recheck Vit-Everard Interrante;Lipid;CMP/she states that her back is hurting pretty bad and radiating into her butt cheek/I advised her to try alternating Ibuprofen and Tylenol q4h and she could also try Valerian Root OTC as a natural muscle relaxant/I explained that if she feels that she is in no better or worse in the morning that she could go to the [place] office to be seen/I also said that if she sees progress but not enough progress and not enough discomfort to be seen over the weekend then to please call and we can get her in on Monday/she is in agreement/plz advise if there is anything else I should tell her/thx dmf

## 2018-07-01 ENCOUNTER — Ambulatory Visit
Admission: RE | Admit: 2018-07-01 | Discharge: 2018-07-01 | Disposition: A | Discharge status: Home / Self Care | Payer: 59 | Source: Ambulatory Visit | Attending: Family Medicine | Admitting: Family Medicine

## 2018-07-01 DIAGNOSIS — Z1239 Encounter for other screening for malignant neoplasm of breast: Secondary | ICD-10-CM

## 2018-07-01 DIAGNOSIS — Z1231 Encounter for screening mammogram for malignant neoplasm of breast: Secondary | ICD-10-CM | POA: Diagnosis not present

## 2018-07-06 ENCOUNTER — Other Ambulatory Visit: Payer: Self-pay

## 2018-07-07 ENCOUNTER — Other Ambulatory Visit (INDEPENDENT_AMBULATORY_CARE_PROVIDER_SITE_OTHER): Payer: 59

## 2018-07-07 DIAGNOSIS — E559 Vitamin D deficiency, unspecified: Secondary | ICD-10-CM | POA: Diagnosis not present

## 2018-07-07 DIAGNOSIS — E785 Hyperlipidemia, unspecified: Secondary | ICD-10-CM

## 2018-07-07 LAB — COMPREHENSIVE METABOLIC PANEL
ALBUMIN: 4.3 g/dL (ref 3.5–5.2)
ALT: 12 U/L (ref 0–35)
AST: 13 U/L (ref 0–37)
Alkaline Phosphatase: 91 U/L (ref 39–117)
BUN: 15 mg/dL (ref 6–23)
CALCIUM: 9.3 mg/dL (ref 8.4–10.5)
CHLORIDE: 103 meq/L (ref 96–112)
CO2: 26 mEq/L (ref 19–32)
CREATININE: 0.76 mg/dL (ref 0.40–1.20)
GFR: 84.09 mL/min (ref >60.00)
Glucose, Bld: 96 mg/dL (ref 70–99)
Potassium: 3.9 mEq/L (ref 3.5–5.1)
Sodium: 137 mEq/L (ref 135–145)
Total Bilirubin: 0.5 mg/dL (ref 0.2–1.2)
Total Protein: 7.7 g/dL (ref 6.0–8.3)

## 2018-07-07 LAB — LIPID PANEL
CHOLESTEROL: 215 mg/dL — AB (ref 0–200)
HDL: 43.9 mg/dL (ref >39.00)
LDL CALC: 132 mg/dL — AB (ref 0–99)
NonHDL: 171.21
TRIGLYCERIDES: 196 mg/dL — AB (ref 0.0–149.0)
Total CHOL/HDL Ratio: 5
VLDL: 39.2 mg/dL (ref 0.0–40.0)

## 2018-07-07 LAB — VITAMIN D 25 HYDROXY (VIT D DEFICIENCY, FRACTURES): VITD: 32.48 ng/mL (ref 30.00–100.00)

## 2018-07-08 ENCOUNTER — Encounter: Payer: Self-pay | Admitting: Family Medicine

## 2018-07-21 ENCOUNTER — Telehealth: Payer: Self-pay | Admitting: Emergency Medicine

## 2018-07-21 NOTE — Telephone Encounter (Signed)
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 States that she was already told that this could not wait until Monday. States that she could do an early morning appointment, but has to be back at work before 2pm and it is a 84min drive back to work. Please advise.

## 2018-07-21 NOTE — Telephone Encounter (Signed)
TA-heart has been fluttering for 2d and head is hurting,dizzy, anxious, can't be still, can't sleep, and something happened at work/she sent a message responding to TA a couple weeks ago but never heard back/I copied and pasted below:  Good evening Dr. Deborra Medina. I'm doing ok. In response to your question regarding how much Vitamin [place]en I'm taking. I'm not taking any. I took the two rounds of the prescriptions Vitamin Jayion Schneck you put me on. If I need to buy it please let me know what kind and the mg dosage you want me on. Also, my cholesterol number still seemed high? I'm also concerned about the zoloft mg I'm taking. I'm starting to have dizzy spells again and I catch myself grinding my teeth all hours of the day and I cant be still? Also, when i get home from work and get in bed, after being on my feet 8 hours, my ankle and leg is still going thru alot of pain and restlessness. Almost more than I can bear. Please let me know what I can do with all of my above concerns. Thank you Dr. Deborra Medina

## 2018-07-21 NOTE — Telephone Encounter (Signed)
I am sorry sorry she is having a tough time.  Can we work her in tomorrow morning?  Yes, I do agree we should wean her off zoloft- 1/2 tab every other for 7-10 (until she fells okay stopping it).

## 2018-07-21 NOTE — Telephone Encounter (Signed)
Copied from [place] [id number]. Topic: General - Other >> Jul 21, 2018 11:17 AM Leward Quan A wrote: Reason for CRM: Patient called to speak with [facility] nurse. She did not elaborate on what this was in reference to but only said "she has a new health issue that started a few weeks ago. Also said she received an email from Dr. Deborra Medina and was waiting for another response. But said she only want a call back from Nurse Sharyn Lull. Please advise

## 2018-07-21 NOTE — Telephone Encounter (Signed)
TA-Addendum:  I have pt scheduled with Baldo Ash tomorrow at 1:30 for EKG/if you feel after looking at this message in full that taking her off Zoloft since she feels that is where Sx initiated then please message patient back via MyChart/she is quite anxious that neither you nor I will be here tomorrow/I assured her that I would call her tomorrow before I leave Endocrinology office/thx dmf

## 2018-07-21 NOTE — Telephone Encounter (Unsigned)
Copied from [place] [id number]. Topic: General - Other >> Jul 21, 2018 11:17 AM Leward Quan A wrote: Reason for CRM: Patient called to speak with [facility] nurse. She did not elaborate on what this was in reference to but only said "she has a new health issue that started a few weeks ago. Also said she received an email from Dr. Deborra Medina and was waiting for another response. But said she only want a call back from Nurse Sharyn Lull. Please advise

## 2018-07-22 ENCOUNTER — Ambulatory Visit: Payer: 59 | Admitting: Family Medicine

## 2018-07-22 ENCOUNTER — Telehealth: Payer: Self-pay

## 2018-07-22 ENCOUNTER — Encounter: Payer: Self-pay | Admitting: Family Medicine

## 2018-07-22 ENCOUNTER — Ambulatory Visit: Payer: 59 | Admitting: Nurse Practitioner

## 2018-07-22 VITALS — BP 120/76 | HR 84 | Ht 65.0 in | Wt 181.6 lb

## 2018-07-22 DIAGNOSIS — R002 Palpitations: Secondary | ICD-10-CM | POA: Insufficient documentation

## 2018-07-22 DIAGNOSIS — F411 Generalized anxiety disorder: Secondary | ICD-10-CM

## 2018-07-22 MED ORDER — SERTRALINE HCL 50 MG PO TABS: 50.0000 mg | ORAL_TABLET | Freq: Two times a day (BID) | ORAL | 3 refills | Status: DC

## 2018-07-22 NOTE — Assessment & Plan Note (Addendum)
-  EKG completed and normal.  -Her palpitations are likely being driven by her anxiety. -Previous TSH reviewed.

## 2018-07-22 NOTE — Telephone Encounter (Signed)
I promised pt that I would call her today although I am working at Endocrinology office/I [place] stating that I did not forget and I hope that she is doing ok/thx dmf

## 2018-07-22 NOTE — Patient Instructions (Signed)
Increase your zoloft to 50mg  twice per day Follow up with Dr. Deborra Medina in 2 weeks.  I hope you find your church group helpful Please call or send mychart message with any questions

## 2018-07-22 NOTE — Progress Notes (Addendum)
Danielle Strickland - 52 y.o. female MRN [id number]  Date of birth: 12-16-1965  Subjective Chief Complaint  Patient presents with  . Palpitations    HPI Danielle Strickland is a 52 y.o. female with a history of anxiety, migraines, and CRPS of the R leg here today with complaint of palpitations.  She reports she has had increased palpitations over the past few weeks.  Palpitations are typically associated with feelings of increased anxiety.  She denies chest pain, shortness of breath, or headaches associated with the palpitations.  She does have some dizziness.  Reports more stress related to her her family and job over the past few weeks.  States that she is having to do the work of her co-worker as well.  She is currently taking sertraline 25mg  BID which she states had worked well for her up until this point.  There was a recent phone note indicating that she may have been having side effects from the zoloft.   She denies side effects from zoloft and actually thinks the dizzy spells, jaw clenching and restlessness is from her increased anxiety and thinks an increase in zoloft may be helpful.  Has tried prozac previously but felt numb to emotions while taking this.  She also has tried benzodiazepines in the past but felt that she was becoming dependent on these. She is resistant to counseling/therapy, states this is against her religious beliefs.  She tells me that she does have a group at church that she prays with.    She also reports toe pain to R 2nd digit.  States that she dropped TV stand on her toe a few days ago.  Painful to weight bear but has FROM.  There is no swelling.    ROS:  A comprehensive ROS was completed and negative except as noted per HPI  Depression screen [facility] 2/9 07/22/2018 04/06/2018  Decreased Interest 0 0  Down, Depressed, Hopeless 1 1  PHQ - 2 Score 1 1  Altered sleeping 3 3  Tired, decreased energy 2 3  Change in appetite 2 3  Feeling bad or failure about yourself  0 1    Trouble concentrating 1 1  Moving slowly or fidgety/restless 2 0  Suicidal thoughts 0 0  PHQ-9 Score 11 12  Difficult doing work/chores - Very difficult   GAD 7 : Generalized Anxiety Score 07/22/2018 04/06/2018  Nervous, Anxious, on Edge 2 2  Control/stop worrying 2 2  Worry too much - different things 2 2  Trouble relaxing 2 3  Restless 3 2  Easily annoyed or irritable 1 2  Afraid - awful might happen 0 2  Total GAD 7 Score 12 15  Anxiety Difficulty - Very difficult      Allergies  Allergen Reactions  . Augmentin [Amoxicillin-Pot Clavulanate] Nausea And Vomiting and Other (See Comments)    Has patient had a PCN reaction causing immediate rash, facial/tongue/throat swelling, SOB or lightheadedness with hypotension: No Has patient had a PCN reaction causing severe rash involving mucus membranes or skin necrosis: No Has patient had a PCN reaction that required hospitalization No Has patient had a PCN reaction occurring within the last 10 years: No If all of the above answers are "NO", then may proceed with Cephalosporin use.  . Codeine Nausea And Vomiting  . Latex Rash  . Aspirin Nausea And Vomiting  . Neosporin [Neomycin-Bacitracin Zn-Polymyx]     Past Medical History:  Diagnosis Date  . Abnormal Pap smear of vagina and vaginal  HPV    ascus   . Allergy   . Endometriosis   . GERD (gastroesophageal reflux disease)   . 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 History of migraine   . HPV in female   . IBS (irritable bowel syndrome)   . Liver disease    H/O hepatitis  . Mental disorder    anxiety  . Microscopic hematuria   . Migraines   . Peptic ulcer disease   . Skin cancer    left thigh    Past Surgical History:  Procedure Laterality Date  . ABDOMINAL HYSTERECTOMY    . APPENDECTOMY    . CHOLECYSTECTOMY N/A 11/26/2015   Procedure: LAPAROSCOPIC CHOLECYSTECTOMY ;  Surgeon: Jules Husbands, MD;  Location: [facility] ORS;  Service: General;  Laterality:  N/A;  . laparoscopically assisted vaginal hysterectomy  2006  . SALIVARY GLAND SURGERY    . submandular    . TUBAL LIGATION    . WISDOM TOOTH EXTRACTION      Social History   Socioeconomic History  . Marital status: Legally Separated    Spouse name: Not on file  . Number of children: Not on file  . Years of education: Not on file  . Highest education level: Not on file  Occupational History  . Not on file  Social Needs  . Financial resource strain: Not on file  . Food insecurity:    Worry: Not on file    Inability: Not on file  . Transportation needs:    Medical: Not on file    Non-medical: Not on file  Tobacco Use  . Smoking status: Former Smoker    Packs/day: 1.00    Types: Cigarettes    Last attempt to quit: 03/03/2015    Years since quitting: 3.3  . Smokeless tobacco: Never Used  . Tobacco comment: Quit May 8  Substance and Sexual Activity  . Alcohol use: No    Alcohol/week: 0.0 standard drinks  . Drug use: No  . Sexual activity: Not Currently    Birth control/protection: Surgical    Comment: hyst/BTL   Lifestyle  . Physical activity:    Days per week: Not on file    Minutes per session: Not on file  . Stress: Not on file  Relationships  . Social connections:    Talks on phone: Not on file    Gets together: Not on file    Attends religious service: Not on file    Active member of club or organization: Not on file    Attends meetings of clubs or organizations: Not on file    Relationship status: Not on file  Other Topics Concern  . Not on file  Social History Narrative   [phone]   Married to fourth husband    Family History  Problem Relation Age of Onset  . Cancer Mother        colon   . Breast cancer Maternal Grandmother   . Thyroid cancer Maternal Aunt   . Breast cancer Maternal Aunt   . Cancer Paternal Aunt        colon    Health Maintenance  Topic Date Due  . INFLUENZA VACCINE  05/26/2018  . Hepatitis C Screening  05/10/2038 (Originally  04/06/1966)  . HIV Screening  05/10/2038 (Originally 11/17/1978)  . MAMMOGRAM  07/01/2020  . COLONOSCOPY  01/21/2025  . TETANUS/TDAP  05/23/2026  . PAP SMEAR  Discontinued    ----------------------------------------------------------------------------------------------------------------------------------------------------------------------------------------------------------------- Physical Exam BP 120/76 (  BP Location: Left Arm, Patient Position: Sitting, Cuff Size: Normal)   Pulse 84   Ht 5\' 5"  (1.651 m)   Wt 181 lb 9.6 oz (82.4 kg)   SpO2 98%   BMI 30.22 kg/m   Physical Exam  Constitutional: She is oriented to person, place, and time. She appears well-nourished. No distress.  HENT:  Head: Normocephalic and atraumatic.  Mouth/Throat: Oropharynx is clear and moist.  Eyes: No scleral icterus.  Neck: No thyromegaly present.  Cardiovascular: Normal rate, regular rhythm and normal heart sounds.  Pulmonary/Chest: Effort normal and breath sounds normal.  Musculoskeletal: She exhibits no tenderness.  Toe without bruising or swelling.  She does not allow entire exam of toe due to sensitivity of foot from CRPS.  She does have FROM  Lymphadenopathy:    She has no cervical adenopathy.  Neurological: She is alert and oriented to person, place, and time.  Skin: Skin is warm and dry.  Psychiatric: Her behavior is normal. Thought content normal. Her mood appears anxious. Her speech is not rapid and/or pressured.    ------------------------------------------------------------------------------------------------------------------------------------------------------------------------------------------------------------------- Assessment and Plan  Palpitation -EKG completed and normal.  -Her palpitations are likely being driven by her anxiety. -Previous TSH reviewed.   Generalized anxiety disorder -She seems to have significant anxiety contributing to her symptoms -We discussed further  treatments of her anxiety today including counseling, adjustment of zoloft of addition of other medications -She is resistant to counseling/therapy. -I recommended increased in zoloft initially to 50mg  BID(reports taking twice daily works better for her) -We briefly discussed buspar or hydroxyzine for anxiety which can be considered in the future.  We discussed need to avoid benzo's due to her previous dependence on these.  -She will f/u with PCP in 2 weeks.
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
## 2018-07-22 NOTE — Assessment & Plan Note (Signed)
-  She seems to have significant anxiety contributing to her symptoms -We discussed further treatments of her anxiety today including counseling, adjustment of zoloft of addition of other medications -She is resistant to counseling/therapy. -I recommended increased in zoloft initially to 50mg  BID(reports taking twice daily works better for her) -We briefly discussed buspar or hydroxyzine for anxiety which can be considered in the future.  We discussed need to avoid benzo's due to her previous dependence on these.  -She will f/u with PCP in 2 weeks.

## 2018-08-09 ENCOUNTER — Ambulatory Visit (INDEPENDENT_AMBULATORY_CARE_PROVIDER_SITE_OTHER): Payer: 59

## 2018-08-09 ENCOUNTER — Ambulatory Visit: Payer: 59 | Admitting: Family Medicine

## 2018-08-09 ENCOUNTER — Encounter: Payer: Self-pay | Admitting: Family Medicine

## 2018-08-09 VITALS — BP 114/80 | HR 75 | Temp 99.0°F | Ht 65.0 in | Wt 181.4 lb

## 2018-08-09 DIAGNOSIS — R0600 Dyspnea, unspecified: Secondary | ICD-10-CM | POA: Diagnosis not present

## 2018-08-09 DIAGNOSIS — G90521 Complex regional pain syndrome I of right lower limb: Secondary | ICD-10-CM | POA: Diagnosis not present

## 2018-08-09 DIAGNOSIS — R0602 Shortness of breath: Secondary | ICD-10-CM

## 2018-08-09 DIAGNOSIS — F411 Generalized anxiety disorder: Secondary | ICD-10-CM | POA: Diagnosis not present

## 2018-08-09 DIAGNOSIS — R002 Palpitations: Secondary | ICD-10-CM

## 2018-08-09 LAB — COMPREHENSIVE METABOLIC PANEL
ALT: 12 U/L (ref 0–35)
AST: 12 U/L (ref 0–37)
Albumin: 4.4 g/dL (ref 3.5–5.2)
Alkaline Phosphatase: 104 U/L (ref 39–117)
BILIRUBIN TOTAL: 0.4 mg/dL (ref 0.2–1.2)
BUN: 14 mg/dL (ref 6–23)
CO2: 27 meq/L (ref 19–32)
Calcium: 9.4 mg/dL (ref 8.4–10.5)
Chloride: 105 mEq/L (ref 96–112)
Creatinine, Ser: 0.67 mg/dL (ref 0.40–1.20)
GFR: 97.22 mL/min (ref >60.00)
GLUCOSE: 105 mg/dL — AB (ref 70–99)
Potassium: 4 mEq/L (ref 3.5–5.1)
Sodium: 140 mEq/L (ref 135–145)
TOTAL PROTEIN: 7.6 g/dL (ref 6.0–8.3)

## 2018-08-09 LAB — CBC
HCT: 38.2 % (ref 36.0–46.0)
HEMOGLOBIN: 13.1 g/dL (ref 12.0–15.0)
MCHC: 34.3 g/dL (ref 30.0–36.0)
MCV: 92.7 fl (ref 78.0–100.0)
Platelets: 283 10*3/uL (ref 150.0–400.0)
RBC: 4.12 Mil/uL (ref 3.87–5.11)
RDW: 12.9 % (ref 11.5–15.5)
WBC: 5.8 10*3/uL (ref 4.0–10.5)

## 2018-08-09 LAB — TSH: TSH: 2.85 u[IU]/mL (ref 0.35–4.50)

## 2018-08-09 LAB — T4, FREE: FREE T4: 0.63 ng/dL (ref 0.60–1.60)

## 2018-08-09 MED ORDER — PREGABALIN 150 MG PO CAPS: 150.0000 mg | ORAL_CAPSULE | Freq: Two times a day (BID) | ORAL | 3 refills | Status: DC

## 2018-08-09 MED ORDER — ACYCLOVIR 200 MG PO CAPS: ORAL_CAPSULE | ORAL | 11 refills | Status: DC

## 2018-08-09 MED ORDER — SERTRALINE HCL 50 MG PO TABS: 25.0000 mg | ORAL_TABLET | Freq: Two times a day (BID) | ORAL | 3 refills | Status: DC

## 2018-08-09 NOTE — Patient Instructions (Addendum)
Good to see you. I am sorry you've had such a tough time. We are increasing your Lyrica to 150 mg twice daily as this can help with both neuropathic pain and anxiety. Decrease your zoloft to 25 mg twice daily.  I am checking labs today as well. We will call you or send you a my chart message with these results.  Please update me in two weeks.

## 2018-08-09 NOTE — Progress Notes (Signed)
Subjective:   Patient ID: Danielle Strickland, female    DOB: 20-Jul-1966, 52 y.o.   MRN: [id number]  Danielle Strickland is a pleasant 52 y.o. year old female who presents to clinic today with Follow-up (Patient is here today for a 2-week-F/U.  She saw Dr. Zigmund Daniel on 9.27.19 for palpatations and he completed an EKG which was WNL and reviewed last TSH.  Dx was GAD and he increased her Zoloft to 50mg  bid and briefly discussed buspirone and Hydroxyzine for future consideration.  Patient is very upset today.  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 Zigmund Daniel on 07/22/18.  Note reviewed.  She complained of palpitations, increased anxiety and dizziness.   She declined referral for psychotherapy due to cost.  Increased stressors at home and at work.  PHQ 9 was 11 and GAD 7 was 12.  EKG was reassuring.  Dr. Zigmund Daniel increased her zoloft to 50 mg twice daily.  Advise to follow up with me here today.  She slowly had to increase her zoloft to 50 mg twice daily - feels her body is shaking and she is going fast.  She does not feel zoloft is helping as it should.  Lab Results  Component Value Date   TSH 4.30 05/10/2018   Complex regional pain syndrome- felt lyrica 75 mg twice daily was working well until recently.  Having more leg neuropathic pain.  Palpitations not as bad. She is not having CP but she is having more SOB when she talks- h/o collapsed lung after her accident years ago.  Breathing has never been the same since then but she does feel it is a little worse now.    Current Outpatient Medications on File Prior to Visit  Medication Sig  Dispense Refill  . furosemide (LASIX) 20 MG tablet Take 1 tablet (20 mg total) by mouth daily as needed. 30 tablet 2  . linaclotide (LINZESS) 145 MCG CAPS capsule Take 1 capsule (145 mcg total) by mouth daily before breakfast. 30 capsule 11  . lovastatin (MEVACOR) 20 MG tablet Take 1 tablet (20 mg total) by mouth at bedtime. 90 tablet 1  . pantoprazole (PROTONIX) 40 MG tablet Take 1 tablet (40 mg total) by mouth daily. 30 tablet 11  . promethazine (PHENERGAN) 25 MG tablet Take 1q8h prn N&V with Migraines 30 tablet 3  . SUMAtriptan (IMITREX) 50 MG tablet Take 1 tablet (50 mg total) by mouth every 2 (two) hours as needed for migraine. 10 tablet 3   No current facility-administered medications on file prior to visit.     Allergies  Allergen Reactions  . Augmentin [Amoxicillin-Pot Clavulanate] Nausea And Vomiting and Other (See Comments)    Has patient had a PCN reaction causing immediate rash, facial/tongue/throat swelling, SOB or lightheadedness with hypotension: No Has patient had a PCN reaction causing severe rash involving mucus membranes or skin necrosis: No Has patient had a PCN reaction that required hospitalization No Has patient had a PCN reaction occurring within the last 10 years: No If all of  the above answers are "NO", then may proceed with Cephalosporin use.  . Codeine Nausea And Vomiting  . Latex Rash  . Aspirin Nausea And Vomiting  . Neosporin [Neomycin-Bacitracin Zn-Polymyx]     Past Medical History:  Diagnosis Date  . Abnormal Pap smear of vagina and vaginal HPV    ascus   . Allergy   . Endometriosis   . GERD (gastroesophageal reflux disease)   . 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 History of migraine   . HPV in female   . IBS (irritable bowel syndrome)   . Liver disease    H/O hepatitis  . Mental disorder    anxiety  . Microscopic hematuria   . Migraines   . Peptic ulcer disease   . Skin cancer    left thigh  . Tobacco abuse  02/21/2013    Past Surgical History:  Procedure Laterality Date  . ABDOMINAL HYSTERECTOMY    . APPENDECTOMY    . CHOLECYSTECTOMY N/A 11/26/2015   Procedure: LAPAROSCOPIC CHOLECYSTECTOMY ;  Surgeon: Jules Husbands, MD;  Location: [facility] ORS;  Service: General;  Laterality: N/A;  . laparoscopically assisted vaginal hysterectomy  2006  . SALIVARY GLAND SURGERY    . submandular    . TUBAL LIGATION    . WISDOM TOOTH EXTRACTION      Family History  Problem Relation Age of Onset  . Cancer Mother        colon   . Breast cancer Maternal Grandmother   . Thyroid cancer Maternal Aunt   . Breast cancer Maternal Aunt   . Cancer Paternal Aunt        colon    Social History   Socioeconomic History  . Marital status: Legally Separated    Spouse name: Not on file  . Number of children: Not on file  . Years of education: Not on file  . Highest education level: Not on file  Occupational History  . Not on file  Social Needs  . Financial resource strain: Not on file  . Food insecurity:    Worry: Not on file    Inability: Not on file  . Transportation needs:    Medical: Not on file    Non-medical: Not on file  Tobacco Use  . Smoking status: Former Smoker    Packs/day: 1.00    Types: Cigarettes    Last attempt to quit: 03/03/2015    Years since quitting: 3.4  . Smokeless tobacco: Never Used  . Tobacco comment: Quit May 8  Substance and Sexual Activity  . Alcohol use: No    Alcohol/week: 0.0 standard drinks  . Drug use: No  . Sexual activity: Not Currently    Birth control/protection: Surgical    Comment: hyst/BTL   Lifestyle  . Physical activity:    Days per week: Not on file    Minutes per session: Not on file  . Stress: Not on file  Relationships  . Social connections:    Talks on phone: Not on file    Gets together: Not on file    Attends religious service: Not on file    Active member of club or organization: Not on file    Attends meetings of clubs or organizations: Not  on file    Relationship status: Not on file  . Intimate partner violence:    Fear of current or ex partner: Not on file    Emotionally abused: Not on file  Physically abused: Not on file    Forced sexual activity: Not on file  Other Topics Concern  . Not on file  Social History Narrative   [phone]   Married to fourth husband   The PMH, PSH, Social History, Family History, Medications, and allergies have been reviewed in [facility], and have been updated if relevant.    Review of Systems  Respiratory: Positive for shortness of breath. Negative for apnea, cough, choking, chest tightness, wheezing and stridor.   Cardiovascular: Positive for palpitations. Negative for chest pain and leg swelling.  Musculoskeletal: Positive for back pain.  Neurological: Positive for tremors and numbness.  Psychiatric/Behavioral: Positive for decreased concentration. Negative for agitation, behavioral problems, confusion, dysphoric mood, hallucinations, self-injury, sleep disturbance and suicidal ideas. The patient is nervous/anxious. The patient is not hyperactive.   All other systems reviewed and are negative.      Objective:    BP 114/80 (BP Location: Left Arm, Patient Position: Sitting, Cuff Size: Normal)   Pulse 75   Temp 99 F (37.2 C) (Oral)   Ht 5\' 5"  (1.651 m)   Wt 181 lb 6.4 oz (82.3 kg)   SpO2 97%   BMI 30.19 kg/m    Physical Exam  Constitutional: She is oriented to person, place, and time. She appears well-developed and well-nourished. No distress.  HENT:  Head: Normocephalic and atraumatic.  Eyes: EOM are normal.  Neck: Normal range of motion.  Cardiovascular: Normal rate and regular rhythm.  Pulmonary/Chest: No respiratory distress. She has decreased breath sounds.  Musculoskeletal: Normal range of motion. She exhibits no edema.  Neurological: She is alert and oriented to person, place, and time. No cranial nerve deficit.  Skin: Skin is warm and dry. She is not diaphoretic.    Psychiatric: She has a normal mood and affect. Her behavior is normal. Judgment and thought content normal.  Nursing note and vitals reviewed.         Assessment & Plan:   Generalized anxiety disorder - Plan: TSH, T4, free, CBC, Comprehensive metabolic panel, sertraline (ZOLOFT) 50 MG tablet  Palpitation - Plan: TSH, T4, free, CBC, Comprehensive metabolic panel  Complex regional pain syndrome type 1 affecting right lower leg No follow-ups on file.

## 2018-08-09 NOTE — Assessment & Plan Note (Addendum)
>  40 minutes spent in face to face time with patient, >50% spent in counselling or coordination of care discussing anxiety, neuropathic pain, palpitations, SOB. She is not tolerating higher dose of zoloft well- will decrease dose back to 25 mg twice daily and perhaps wean her off of it. Increase Lyrica to 150 mg twice daily to help with both anxiety and complex regional pain neuropathy. Will also check labs today which can contribute to anxiety.  Repeat CXR given h/o pneumothorax. The patient indicates understanding of these issues and agrees with the plan. Orders Placed This Encounter  Procedures  . DG Chest 2 View  . TSH  . T4, free  . CBC  . Comprehensive metabolic panel

## 2018-09-01 ENCOUNTER — Ambulatory Visit: Payer: 59 | Admitting: Family Medicine

## 2018-09-01 ENCOUNTER — Encounter: Payer: Self-pay | Admitting: Family Medicine

## 2018-09-01 VITALS — BP 138/88 | HR 81 | Temp 98.1°F | Ht 65.0 in | Wt 182.0 lb

## 2018-09-01 DIAGNOSIS — J0100 Acute maxillary sinusitis, unspecified: Secondary | ICD-10-CM | POA: Diagnosis not present

## 2018-09-01 MED ORDER — PROMETHAZINE-PHENYLEPHRINE 6.25-5 MG/5ML PO SYRP: 5.0000 mL | ORAL_SOLUTION | Freq: Four times a day (QID) | ORAL | 0 refills | Status: DC | PRN

## 2018-09-01 MED ORDER — DOXYCYCLINE HYCLATE 100 MG PO TABS: 100.0000 mg | ORAL_TABLET | Freq: Two times a day (BID) | ORAL | 0 refills | Status: DC

## 2018-09-01 MED ORDER — PROMETHAZINE-CODEINE 6.25-10 MG/5ML PO SOLN: 5.0000 mL | Freq: Three times a day (TID) | ORAL | 0 refills | Status: DC | PRN

## 2018-09-01 MED ORDER — PREDNISONE 20 MG PO TABS: ORAL_TABLET | ORAL | 0 refills | Status: DC

## 2018-09-01 NOTE — Patient Instructions (Signed)
Drink plenty of fluids Use nasal saline spray 3x/day Use flonase 2 sprays each nostril daily Take prednisone as directed x 5 days Take antibiotic as directed Use cough syrup as needed Follow-up if symptoms worsen or do not improve in 1 week

## 2018-09-01 NOTE — Progress Notes (Signed)
Danielle Strickland is a 52 y.o. female  Chief Complaint  Patient presents with  . Sore Throat    2 weeks with a sore throat, sunday night SOB and wheezing, nasal congestion with chest congestion. Gasping for air with cough. Fatigued.    HPI: Danielle Strickland is a 52 y.o. female complains of 2 week h/o sore throat, PND, nasal congestion, runny nose. In the past week she also notes increased fatigue, cough, SOB, facial pressure, nasal congestion. She states her nose is "completely closed on both sides". No fever, chills. Pt states she feels "clammy" at times.  She is very concerned about her symptoms. She states she has been working with her PCP to manage "breathing issues" but above symptoms are different.  She has not tried any OTC meds because she states she had a "life-threatening" reaction to an OTC med a few years ago and is very cautious. She states she can only take Rx promethazine/codeine for cough (despite documented allergy to codeine).  Past Medical History:  Diagnosis Date  . Abnormal Pap smear of vagina and vaginal HPV    ascus   . Allergy   . Endometriosis   . GERD (gastroesophageal reflux disease)   . 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 History of migraine   . HPV in female   . IBS (irritable bowel syndrome)   . Liver disease    H/O hepatitis  . Mental disorder    anxiety  . Microscopic hematuria   . Migraines   . Peptic ulcer disease   . Skin cancer    left thigh  . Tobacco abuse 02/21/2013    Past Surgical History:  Procedure Laterality Date  . ABDOMINAL HYSTERECTOMY    . APPENDECTOMY    . CHOLECYSTECTOMY N/A 11/26/2015   Procedure: LAPAROSCOPIC CHOLECYSTECTOMY ;  Surgeon: Jules Husbands, MD;  Location: [facility] ORS;  Service: General;  Laterality: N/A;  . laparoscopically assisted vaginal hysterectomy  2006  . SALIVARY GLAND SURGERY    . submandular    . TUBAL LIGATION    . WISDOM TOOTH EXTRACTION      Social History    Socioeconomic History  . Marital status: Legally Separated    Spouse name: Not on file  . Number of children: Not on file  . Years of education: Not on file  . Highest education level: Not on file  Occupational History  . Not on file  Social Needs  . Financial resource strain: Not on file  . Food insecurity:    Worry: Not on file    Inability: Not on file  . Transportation needs:    Medical: Not on file    Non-medical: Not on file  Tobacco Use  . Smoking status: Former Smoker    Packs/day: 1.00    Types: Cigarettes    Last attempt to quit: 03/03/2015    Years since quitting: 3.5  . Smokeless tobacco: Never Used  . Tobacco comment: Quit May 8  Substance and Sexual Activity  . Alcohol use: No    Alcohol/week: 0.0 standard drinks  . Drug use: No  . Sexual activity: Not Currently    Birth control/protection: Surgical    Comment: hyst/BTL   Lifestyle  . Physical activity:    Days per week: Not on file    Minutes per session: Not on file  . Stress: Not on file  Relationships  . Social connections:    Talks on phone:  Not on file    Gets together: Not on file    Attends religious service: Not on file    Active member of club or organization: Not on file    Attends meetings of clubs or organizations: Not on file    Relationship status: Not on file  . Intimate partner violence:    Fear of current or ex partner: Not on file    Emotionally abused: Not on file    Physically abused: Not on file    Forced sexual activity: Not on file  Other Topics Concern  . Not on file  Social History Narrative   [phone]   Married to fourth husband    Family History  Problem Relation Age of Onset  . Cancer Mother        colon   . Breast cancer Maternal Grandmother   . Thyroid cancer Maternal Aunt   . Breast cancer Maternal Aunt   . Cancer Paternal Aunt        colon     Immunization History  Administered Date(s) Administered  . Tdap 05/23/2016    Outpatient Encounter  Medications as of 09/01/2018  Medication Sig  . acyclovir (ZOVIRAX) 200 MG capsule Take 2 caps bid-tid for 5d prn  . furosemide (LASIX) 20 MG tablet Take 1 tablet (20 mg total) by mouth daily as needed.  . linaclotide (LINZESS) 145 MCG CAPS capsule Take 1 capsule (145 mcg total) by mouth daily before breakfast.  . lovastatin (MEVACOR) 20 MG tablet Take 1 tablet (20 mg total) by mouth at bedtime.  . pantoprazole (PROTONIX) 40 MG tablet Take 1 tablet (40 mg total) by mouth daily.  . pregabalin (LYRICA) 150 MG capsule Take 1 capsule (150 mg total) by mouth 2 (two) times daily.  . promethazine (PHENERGAN) 25 MG tablet Take 1q8h prn N&V with Migraines  . sertraline (ZOLOFT) 50 MG tablet Take 0.5 tablets (25 mg total) by mouth 2 (two) times daily.  . SUMAtriptan (IMITREX) 50 MG tablet Take 1 tablet (50 mg total) by mouth every 2 (two) hours as needed for migraine.   No facility-administered encounter medications on file as of 09/01/2018.      ROS: Gen: no fever, chills, + fatigue ENT: + Rt ear fullness, no ear drainage, + nasal congestion, + rhinorrhea, + sinus pressure, + sore throat Eyes: no blurry vision, double vision Resp: + cough, + wheeze at times , + SOB at times CV: no CP, palpitations, LE edema,  GI: no heartburn, n/v/d/c, abd pain GU: no dysuria, urgency, frequency, hematuria  MSK: no joint pain, myalgias, back pain Neuro: no dizziness, headache, weakness, vertigo Psych: + anxiety   Allergies  Allergen Reactions  . Augmentin [Amoxicillin-Pot Clavulanate] Nausea And Vomiting and Other (See Comments)    Has patient had a PCN reaction causing immediate rash, facial/tongue/throat swelling, SOB or lightheadedness with hypotension: No Has patient had a PCN reaction causing severe rash involving mucus membranes or skin necrosis: No Has patient had a PCN reaction that required hospitalization No Has patient had a PCN reaction occurring within the last 10 years: No If all of the above  answers are "NO", then may proceed with Cephalosporin use.  . Codeine Nausea And Vomiting  . Latex Rash  . Aspirin Nausea And Vomiting  . Neosporin [Neomycin-Bacitracin Zn-Polymyx]     BP 138/88 (BP Location: Left Arm, Patient Position: Sitting, Cuff Size: Normal)   Pulse 81   Temp 98.1 F (36.7 C) (Oral)   Ht 5'  5" (1.651 m)   Wt 182 lb (82.6 kg)   SpO2 95%   BMI 30.29 kg/m   Physical Exam  Constitutional: She is oriented to person, place, and time. She appears well-developed and well-nourished. No distress (but pt is anxious-appearing).  HENT:  Right Ear: Tympanic membrane, external ear and ear canal normal. No drainage or tenderness.  Left Ear: Tympanic membrane, external ear and ear canal normal. No drainage or tenderness.  Nose: Mucosal edema and rhinorrhea present. Right sinus exhibits maxillary sinus tenderness. Left sinus exhibits maxillary sinus tenderness.  Mouth/Throat: Oropharyngeal exudate and posterior oropharyngeal erythema present. No posterior oropharyngeal edema.  Eyes: Conjunctivae are normal.  Neck: Normal range of motion. Neck supple.  Cardiovascular: Normal rate, regular rhythm and normal heart sounds.  No murmur heard. Pulmonary/Chest: Effort normal and breath sounds normal. No respiratory distress. She has no wheezes. She has no rales.  Lymphadenopathy:    She has no cervical adenopathy.  Neurological: She is alert and oriented to person, place, and time.  Skin: Skin is warm and dry.     A/P:  1. Acute non-recurrent maxillary sinusitis - nasal saline spray TID-QID - flonase daily Rx: - predniSONE (DELTASONE) 20 MG tablet; Take 2 tabs po daily x 5 days  Dispense: 10 tablet; Refill: 0 - doxycycline (VIBRA-TABS) 100 MG tablet; Take 1 tablet (100 mg total) by mouth 2 (two) times daily.  Dispense: 20 tablet; Refill: 0 - pt with documented PCN allergy and due to pts anxiety about meds, will also avoid cephalosporins - promethazine-codeine 6.25-10mg /32mL 31mL  every 8 hrs PRN cough - f/u if symptoms worsen or do not improve in 7-10 days Discussed plan and reviewed medications with patient, including risks, benefits, and potential side effects. Pt expressed understand. All questions answered.

## 2018-09-21 ENCOUNTER — Telehealth: Payer: Self-pay | Admitting: *Deleted

## 2018-09-21 NOTE — Telephone Encounter (Signed)
Spoke with patient-she did not wish to give details regarding what was going on. She feels more comfortable speaking to Jeneen Rinks is aware she will return her call on Monday. She was comfortable waiting until then.    Copied from [place] [id number]. Topic: General - Other >> Sep 19, 2018  4:12 PM Janace Aris A wrote: Reason for CRM: pt called in wanting a call back from Dr. Hulen Shouts nurse, she says it's regarding her medication and "a couple other things" pt did not go into detail. She just suggested a call back.

## 2018-10-04 NOTE — Telephone Encounter (Signed)
LMOVM for pt to RTC and apologized that I was unable to call her yesterday when she was promised/thx dmf

## 2018-12-05 ENCOUNTER — Telehealth: Payer: Self-pay

## 2018-12-05 NOTE — Telephone Encounter (Signed)
Copied from [place] [id number]. Topic: General - Other >> Dec 05, 2018  3:01 PM Yvette Rack wrote: Reason for CRM: Pt called in requesting to speak with Dr. Hulen Shouts nurse Sharyn Lull. Pt requests a call back. Cb# [phone]

## 2018-12-06 NOTE — Telephone Encounter (Signed)
Pt needing note for jury duty/she has GAD and PTSD and chronic pain issues/letter prepared and pt will P/U on 2.12.20 after Dr. Deborra Medina signs/thx dmf

## 2019-01-05 ENCOUNTER — Other Ambulatory Visit: Payer: Self-pay

## 2019-01-05 ENCOUNTER — Encounter: Payer: Self-pay | Admitting: Family Medicine

## 2019-01-05 ENCOUNTER — Ambulatory Visit: Payer: 59 | Admitting: Family Medicine

## 2019-01-05 VITALS — BP 122/68 | HR 96 | Temp 98.8°F | Ht 65.0 in | Wt 181.4 lb

## 2019-01-05 DIAGNOSIS — J020 Streptococcal pharyngitis: Secondary | ICD-10-CM | POA: Diagnosis not present

## 2019-01-05 DIAGNOSIS — R070 Pain in throat: Secondary | ICD-10-CM | POA: Diagnosis not present

## 2019-01-05 DIAGNOSIS — F411 Generalized anxiety disorder: Secondary | ICD-10-CM

## 2019-01-05 DIAGNOSIS — J101 Influenza due to other identified influenza virus with other respiratory manifestations: Secondary | ICD-10-CM

## 2019-01-05 LAB — POC INFLUENZA A&B (BINAX/QUICKVUE)
INFLUENZA B, POC: POSITIVE — AB
Influenza A, POC: NEGATIVE

## 2019-01-05 LAB — POCT RAPID STREP A (OFFICE): RAPID STREP A SCREEN: POSITIVE — AB

## 2019-01-05 MED ORDER — PROMETHAZINE-CODEINE 6.25-10 MG/5ML PO SOLN: 5.0000 mL | Freq: Three times a day (TID) | ORAL | 0 refills | Status: DC | PRN

## 2019-01-05 MED ORDER — OSELTAMIVIR PHOSPHATE 75 MG PO CAPS: 75.0000 mg | ORAL_CAPSULE | Freq: Two times a day (BID) | ORAL | 0 refills | Status: AC

## 2019-01-05 MED ORDER — AZITHROMYCIN 250 MG PO TABS: ORAL_TABLET | ORAL | 0 refills | Status: DC

## 2019-01-05 MED ORDER — SERTRALINE HCL 50 MG PO TABS: 25.0000 mg | ORAL_TABLET | Freq: Two times a day (BID) | ORAL | 3 refills | Status: DC

## 2019-01-05 NOTE — Progress Notes (Signed)
Danielle Strickland is a 53 y.o. female  Chief Complaint  Patient presents with  . Cough    Patient is here today C/O cough that is non productive.  Sx started on Sunday and then she started with H/A, throat pain, body aches, right ear pain, nasal congestion and cannot breathe out of her nose.  She is very fatigued and achey she states.  She has been wheezing.  She states that she ihas been exposed to flu, strep, and pneumonia.    HPI: Danielle Strickland is a 53 y.o. female complains of 4 day h/o non-productive cough and 1 day h/o headache, body aches, sore throat, nasal congestion. + fatigue. No fever, chills. Rt ear pain/fullness.  She endorses exposure to individuals with flu, strep, and pneumonia. Pt has taken Rx cough syrup, no other meds  Past Medical History:  Diagnosis Date  . Abnormal Pap smear of vagina and vaginal HPV    ascus   . Allergy   . Endometriosis   . GERD (gastroesophageal reflux disease)   . 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 History of migraine   . HPV in female   . IBS (irritable bowel syndrome)   . Liver disease    H/O hepatitis  . Mental disorder    anxiety  . Microscopic hematuria   . Migraines   . Peptic ulcer disease   . Skin cancer    left thigh  . Tobacco abuse 02/21/2013    Past Surgical History:  Procedure Laterality Date  . ABDOMINAL HYSTERECTOMY    . APPENDECTOMY    . CHOLECYSTECTOMY N/A 11/26/2015   Procedure: LAPAROSCOPIC CHOLECYSTECTOMY ;  Surgeon: Jules Husbands, MD;  Location: [facility] ORS;  Service: General;  Laterality: N/A;  . laparoscopically assisted vaginal hysterectomy  2006  . SALIVARY GLAND SURGERY    . submandular    . TUBAL LIGATION    . WISDOM TOOTH EXTRACTION      Social History   Socioeconomic History  . Marital status: Legally Separated    Spouse name: Not on file  . Number of children: Not on file  . Years of education: Not on file  . Highest education level: Not on file  Occupational  History  . Not on file  Social Needs  . Financial resource strain: Not on file  . Food insecurity:    Worry: Not on file    Inability: Not on file  . Transportation needs:    Medical: Not on file    Non-medical: Not on file  Tobacco Use  . Smoking status: Former Smoker    Packs/day: 1.00    Types: Cigarettes    Last attempt to quit: 03/03/2015    Years since quitting: 3.8  . Smokeless tobacco: Never Used  . Tobacco comment: Quit May 8  Substance and Sexual Activity  . Alcohol use: No    Alcohol/week: 0.0 standard drinks  . Drug use: No  . Sexual activity: Not Currently    Birth control/protection: Surgical    Comment: hyst/BTL   Lifestyle  . Physical activity:    Days per week: Not on file    Minutes per session: Not on file  . Stress: Not on file  Relationships  . Social connections:    Talks on phone: Not on file    Gets together: Not on file    Attends religious service: Not on file    Active member of club or organization:  Not on file    Attends meetings of clubs or organizations: Not on file    Relationship status: Not on file  . Intimate partner violence:    Fear of current or ex partner: Not on file    Emotionally abused: Not on file    Physically abused: Not on file    Forced sexual activity: Not on file  Other Topics Concern  . Not on file  Social History Narrative   [phone]   Married to fourth husband    Family History  Problem Relation Age of Onset  . Cancer Mother        colon   . Breast cancer Maternal Grandmother   . Thyroid cancer Maternal Aunt   . Breast cancer Maternal Aunt   . Cancer Paternal Aunt        colon     Immunization History  Administered Date(s) Administered  . Tdap 05/23/2016    Outpatient Encounter Medications as of 01/05/2019  Medication Sig  . acyclovir (ZOVIRAX) 200 MG capsule Take 2 caps bid-tid for 5d prn  . furosemide (LASIX) 20 MG tablet Take 1 tablet (20 mg total) by mouth daily as needed.  . linaclotide  (LINZESS) 145 MCG CAPS capsule Take 1 capsule (145 mcg total) by mouth daily before breakfast.  . lovastatin (MEVACOR) 20 MG tablet Take 1 tablet (20 mg total) by mouth at bedtime.  . pantoprazole (PROTONIX) 40 MG tablet Take 1 tablet (40 mg total) by mouth daily.  . promethazine (PHENERGAN) 25 MG tablet Take 1q8h prn N&V with Migraines  . Promethazine-Codeine 6.25-10 MG/5ML SOLN Take 5 mLs by mouth every 8 (eight) hours as needed.  . SUMAtriptan (IMITREX) 50 MG tablet Take 1 tablet (50 mg total) by mouth every 2 (two) hours as needed for migraine.  . [DISCONTINUED] doxycycline (VIBRA-TABS) 100 MG tablet Take 1 tablet (100 mg total) by mouth 2 (two) times daily.  . [DISCONTINUED] predniSONE (DELTASONE) 20 MG tablet Take 2 tabs po daily x 5 days  . [DISCONTINUED] pregabalin (LYRICA) 150 MG capsule Take 1 capsule (150 mg total) by mouth 2 (two) times daily. (Patient not taking: Reported on 01/05/2019)  . [DISCONTINUED] sertraline (ZOLOFT) 50 MG tablet Take 0.5 tablets (25 mg total) by mouth 2 (two) times daily. (Patient not taking: Reported on 01/05/2019)   No facility-administered encounter medications on file as of 01/05/2019.      ROS: Pertinent positives and negatives noted in HPI. Remainder of ROS non-contributory    Allergies  Allergen Reactions  . Augmentin [Amoxicillin-Pot Clavulanate] Nausea And Vomiting and Other (See Comments)    Has patient had a PCN reaction causing immediate rash, facial/tongue/throat swelling, SOB or lightheadedness with hypotension: No Has patient had a PCN reaction causing severe rash involving mucus membranes or skin necrosis: No Has patient had a PCN reaction that required hospitalization No Has patient had a PCN reaction occurring within the last 10 years: No If all of the above answers are "NO", then may proceed with Cephalosporin use.  . Codeine Nausea And Vomiting  . Latex Rash  . Aspirin Nausea And Vomiting  . Neosporin [Neomycin-Bacitracin  Zn-Polymyx]     BP 122/68 (BP Location: Right Arm, Patient Position: Sitting, Cuff Size: Normal)   Pulse 96   Temp 98.8 F (37.1 C) (Oral)   Ht 5\' 5"  (1.651 m)   Wt 181 lb 6.4 oz (82.3 kg)   SpO2 96%   BMI 30.19 kg/m   Physical Exam  Constitutional: She is  oriented to person, place, and time. She appears well-developed and well-nourished. No distress.  HENT:  Head: Normocephalic and atraumatic.  Right Ear: Tympanic membrane and ear canal normal.  Left Ear: Tympanic membrane and ear canal normal.  Nose: Nose normal.  Mouth/Throat: Mucous membranes are normal. Posterior oropharyngeal erythema present. No oropharyngeal exudate or posterior oropharyngeal edema.  Eyes: Pupils are equal, round, and reactive to light. Conjunctivae and EOM are normal. Right eye exhibits no discharge. Left eye exhibits no discharge.  Neck: Neck supple.  Cardiovascular: Normal rate, regular rhythm and normal heart sounds.  Pulmonary/Chest: Effort normal and breath sounds normal. No stridor. No respiratory distress. She has no wheezes. She has no rhonchi.  Lymphadenopathy:    She has no cervical adenopathy.  Neurological: She is alert and oriented to person, place, and time.     A/P:  1. Influenza B - POC Influenza A&B (Binax test) - flu B positive Rx: - oseltamivir (TAMIFLU) 75 MG capsule; Take 1 capsule (75 mg total) by mouth 2 (two) times daily for 5 days.  Dispense: 10 capsule; Refill: 0 Refill: - Promethazine-Codeine 6.25-10 MG/5ML SOLN; Take 5 mLs by mouth every 8 (eight) hours as needed.  Dispense: 180 mL; Refill: 0 - cont supportive care to include increased fluids, rest, tylenol or ibuprofen PRN - work note given - f/u if symptoms worsen or do not improve in 7-10 days  2. Strep pharyngitis - POCT rapid strep A - positive  Rx: - azithromycin (ZITHROMAX) 250 MG tablet; 2 tabs po x 1 day then 1 tab po daily  Dispense: 6 tablet; Refill: 0 - pt with allergy to augmentin and ? PCN so will avoid  PCN and use 2nd line agent - supportive care to include increased fluids, salt water gargles, lozenges - f/u if symptoms worsen or do not improve in 7-10 days

## 2019-01-09 ENCOUNTER — Ambulatory Visit: Payer: Self-pay | Admitting: *Deleted

## 2019-01-09 MED ORDER — CLARITHROMYCIN 250 MG PO TABS: 250.0000 mg | ORAL_TABLET | Freq: Two times a day (BID) | ORAL | 0 refills | Status: AC

## 2019-01-09 NOTE — Telephone Encounter (Signed)
Patient called and spoke to [place], [place] and says the pharmacy did not receive her antibiotic prescription. I called [place] and spoke to [place], [facility] who verified no receipt of the antibiotic, phoned in clarithromycin 250 mg 1 tab po BID w/0 refills to [place], [facility], read back and verified.

## 2019-01-09 NOTE — Telephone Encounter (Signed)
Pt is aware/she will get new abx/she states that she feels horrible and went back to work today/I advised her that if she needs to be out of work then she should listen to her body and rest and we could give her a work note/she is also concerned about a yeast infection/I suggested that she eat yogurt a couple times daily and let us know if she becomes symptomatic/also advised her to use warm salt water gargles qid and that Cepacol drops OTC will help to numb the throat/thx dmf

## 2019-01-09 NOTE — Telephone Encounter (Signed)
Pt was Rx'd azithromycin d/t documented augmentin (and therefore possible PCN) allergy. Will send new abx to pharm on file. If no improvement in 4-5 days, pt should f/u with pcp

## 2019-01-09 NOTE — Telephone Encounter (Signed)
MC-Pt was seen last week and Dx with Flu B & Strep Throat/States that she still has a bad taste and smell in her mouth/throat feels raw and painful and has face and gum pain & congestion/she is wondering if the abx needs to be changed? Maybe Magic Mouthwash? IDK/Plz advise/thx dmf

## 2019-01-09 NOTE — Telephone Encounter (Signed)
Message from Parke Poisson sent at 01/09/2019 10:50 AM EDT   Summary: Wants retun phone call,not feeling well   Pt was seen last week and was diagnosed with strep throat.She states she still has a bad taste and smell in her mouth.Throat feels raw and painful along with face and gum pain,congestion.She wants to know if this is normal or if maybe the antiobiotic is not working         Pt states that her throat feels like it is being cut with a knife and has not had much improvement since visit on 01/05/19. Pt states she does not feel well and is currently experiencing pain in her gums, chest and face. Pt states that during this visit she was diagnosed with strep throat and Influenza B. Pt states she is currently at work and states was told by the doctor that it would be ok to return to work on today. Pt denies any fever at this time.  Pt states she was prescribed a Zpak and completed last dose today. Pt wants to know the doctor's recommendations if she should be back at work and if she should be prescribed another course of antibiotics. If prescription given pt would like for it to be sent on [facility] on [address]. Pt can be contacted at [phone] with recommendations.  Reason for Disposition . [1] Taking antibiotic > 72 hours (3 days) for strep throat AND [2] sore throat not improved  Answer Assessment - Initial Assessment Questions 1. ANTIBIOTIC: "What antibiotic are you receiving?" "How many times per day?"     Completed antibiotic today,pt was prescribed a zpak 2. ONSET: "When was the antibiotic started?"     01/05/19 3. SEVERITY: "How bad is the sore throat?"    - MILD: doesn't interfere with eating    - MODERATE: interferes with eating some solids    - SEVERE: can't swallow liquids; drooling      Mild, was able to eat gris and coffe 4. FEVER: "Do you have a fever?" If so, ask: "What is your temperature, how was it measured, and when did it start?"     No fever 5. SYMPTOMS: "Are there any  other symptoms you're concerned about?" If so, ask: "When did it start?"     Throat feels like it is being cut by a knife and is not feeling better since appt on 01/05/19  Protocols used: STREP THROAT INFECTION ON ANTIBIOTIC FOLLOW-UP CALL-A-AH

## 2019-01-10 NOTE — Telephone Encounter (Signed)
Per [name], Danielle Strickland, the patient voiced that Clarithomycin (Biaxin) was called into her pharmacy, but was informed that this medication can interact with the Lovastatin she currently takes at this time. The pharmacist instructed for the patient to make PCP aware of this info and to return call with updated info.   RN spoke with Wilfred Lacy, NP whose covering for the patient's PCP while out of the office. She advised for the patient to hold her Lovastatin until she has completed the last dose of the Clarithomycin (Biaxin) & then resume taking the Lovastatin the next day. Both the patient & pharmacist were given the provider's recommendations and verbalized understanding.

## 2019-02-17 ENCOUNTER — Other Ambulatory Visit: Payer: Self-pay | Admitting: Gastroenterology

## 2019-02-20 NOTE — Telephone Encounter (Signed)
Patient called & has spoken with [facility] on [address] they did not have her medications filled for pantoprazole (PROTONIX) 40 MG & linaclotide (LINZESS) 145 MCG CAPS.

## 2019-04-24 ENCOUNTER — Encounter: Payer: Self-pay | Admitting: Family Medicine

## 2019-05-02 ENCOUNTER — Encounter: Payer: Self-pay | Admitting: Family Medicine

## 2019-05-10 ENCOUNTER — Other Ambulatory Visit: Payer: Self-pay

## 2019-05-10 ENCOUNTER — Telehealth: Payer: Self-pay | Admitting: Gastroenterology

## 2019-05-10 MED ORDER — LINACLOTIDE 145 MCG PO CAPS: ORAL_CAPSULE | ORAL | 3 refills | Status: DC

## 2019-05-10 NOTE — Telephone Encounter (Signed)
Patient called & made an appointment  To refill her medications on 06-27-19. Please call in enough until apptointmant pantoprazole (PROTONIX) 40 MG tablet, &   LINZESS 145 MCG CAPS capsule   To the [facility] on [address].

## 2019-05-10 NOTE — Telephone Encounter (Signed)
Rx for Linzess 169mcg has been called into pt's pharmacy.

## 2019-05-12 ENCOUNTER — Other Ambulatory Visit: Payer: Self-pay

## 2019-05-12 ENCOUNTER — Telehealth: Payer: Self-pay | Admitting: Gastroenterology

## 2019-05-12 MED ORDER — PANTOPRAZOLE SODIUM 40 MG PO TBEC: 40.0000 mg | DELAYED_RELEASE_TABLET | Freq: Every day | ORAL | 1 refills | Status: DC

## 2019-05-12 NOTE — Telephone Encounter (Signed)
Rx for Pantoprazole has been sent to pt's pharmacy.  

## 2019-05-12 NOTE — Telephone Encounter (Signed)
Pt is calling she checked with her pharmacy and Ginger did not call in the rx Pantoprazole to [address]

## 2019-05-18 ENCOUNTER — Other Ambulatory Visit: Payer: Self-pay | Admitting: Family Medicine

## 2019-05-18 DIAGNOSIS — Z1231 Encounter for screening mammogram for malignant neoplasm of breast: Secondary | ICD-10-CM

## 2019-06-07 ENCOUNTER — Encounter (INDEPENDENT_AMBULATORY_CARE_PROVIDER_SITE_OTHER): Payer: Self-pay | Admitting: Ophthalmology

## 2019-06-13 ENCOUNTER — Encounter (INDEPENDENT_AMBULATORY_CARE_PROVIDER_SITE_OTHER): Payer: 59 | Admitting: Ophthalmology

## 2019-06-13 ENCOUNTER — Other Ambulatory Visit: Payer: Self-pay

## 2019-06-13 DIAGNOSIS — H33103 Unspecified retinoschisis, bilateral: Secondary | ICD-10-CM | POA: Diagnosis not present

## 2019-06-13 DIAGNOSIS — H2512 Age-related nuclear cataract, left eye: Secondary | ICD-10-CM

## 2019-06-13 DIAGNOSIS — H43813 Vitreous degeneration, bilateral: Secondary | ICD-10-CM | POA: Diagnosis not present

## 2019-06-27 ENCOUNTER — Ambulatory Visit: Payer: 59 | Admitting: Gastroenterology

## 2019-06-27 ENCOUNTER — Other Ambulatory Visit: Payer: Self-pay

## 2019-06-27 ENCOUNTER — Encounter: Payer: Self-pay | Admitting: Gastroenterology

## 2019-06-27 VITALS — BP 99/64 | HR 98 | Temp 98.3°F | Ht 65.0 in | Wt 181.6 lb

## 2019-06-27 DIAGNOSIS — K219 Gastro-esophageal reflux disease without esophagitis: Secondary | ICD-10-CM | POA: Diagnosis not present

## 2019-06-27 DIAGNOSIS — K5901 Slow transit constipation: Secondary | ICD-10-CM

## 2019-06-27 MED ORDER — LINACLOTIDE 145 MCG PO CAPS: ORAL_CAPSULE | ORAL | 11 refills | Status: DC

## 2019-06-27 MED ORDER — PANTOPRAZOLE SODIUM 40 MG PO TBEC: 40.0000 mg | DELAYED_RELEASE_TABLET | Freq: Every day | ORAL | 11 refills | Status: DC

## 2019-06-27 NOTE — Progress Notes (Signed)
Primary Care Physician: Lucille Passy, MD  Primary Gastroenterologist:  Dr. Lucilla Lame  Chief Complaint  Patient presents with  . Medication Refill    HPI: Danielle Strickland is a 53 y.o. female here for follow-up of her chronic constipation and abdominal pain.  The patient has had these episodes of pain that usually are very intense and then she has residual pain that lasts for 2 to 3 days.  The patient had a recent episode that only lasted a few hours but it doubled her over.  The patient states it is like her gallbladder pain although she does not have a gallbladder anymore.  The patient also needs a refill of her Linzess and Protonix.  She has a history of constipation.  The patient denies any aggravating or relieving things that she has noticed.  There is no report of any unexplained weight loss and in fact she states that she has been trying to lose weight.  She does report that she has been having a craving for carbohydrates and sugars and reports that she believes this all started due to having her gallbladder out.  The patient also suffers from chronic back pain.  She blames the back pain on her bed that she sleeps on.  Current Outpatient Medications  Medication Sig Dispense Refill  . acyclovir (ZOVIRAX) 200 MG capsule Take 2 caps bid-tid for 5d prn 60 capsule 11  . linaclotide (LINZESS) 145 MCG CAPS capsule TAKE 1 CAPSULE BY MOUTH ONCE DAILY BEFORE BREAKFAST 30 capsule 11  . pantoprazole (PROTONIX) 40 MG tablet Take 1 tablet (40 mg total) by mouth daily. 30 tablet 11  . promethazine (PHENERGAN) 25 MG tablet Take 1q8h prn N&V with Migraines 30 tablet 3  . SUMAtriptan (IMITREX) 50 MG tablet Take 1 tablet (50 mg total) by mouth every 2 (two) hours as needed for migraine. 10 tablet 3  . furosemide (LASIX) 20 MG tablet Take 1 tablet (20 mg total) by mouth daily as needed. (Patient not taking: Reported on 06/27/2019) 30 tablet 2  . lovastatin (MEVACOR) 20 MG tablet Take 1 tablet (20 mg  total) by mouth at bedtime. (Patient not taking: Reported on 06/27/2019) 90 tablet 1  . Promethazine-Codeine 6.25-10 MG/5ML SOLN Take 5 mLs by mouth every 8 (eight) hours as needed. (Patient not taking: Reported on 06/27/2019) 180 mL 0  . sertraline (ZOLOFT) 50 MG tablet Take 0.5 tablets (25 mg total) by mouth 2 (two) times daily. (Patient not taking: Reported on 06/27/2019) 30 tablet 3   No current facility-administered medications for this visit.     Allergies as of 06/27/2019 - Review Complete 06/27/2019  Allergen Reaction Noted  . Augmentin [amoxicillin-pot clavulanate] Nausea And Vomiting and Other (See Comments) 09/07/2012  . Codeine Nausea And Vomiting 09/07/2012  . Latex Rash 09/07/2012  . Aspirin Nausea And Vomiting 11/25/2015  . Neosporin [neomycin-bacitracin zn-polymyx]  09/24/2016    ROS:  General: Negative for anorexia, weight loss, fever, chills, fatigue, weakness. ENT: Negative for hoarseness, difficulty swallowing , nasal congestion. CV: Negative for chest pain, angina, palpitations, dyspnea on exertion, peripheral edema.  Respiratory: Negative for dyspnea at rest, dyspnea on exertion, cough, sputum, wheezing.  GI: See history of present illness. GU:  Negative for dysuria, hematuria, urinary incontinence, urinary frequency, nocturnal urination.  Endo: Negative for unusual weight change.    Physical Examination:   BP 99/64   Pulse 98   Temp 98.3 F (36.8 C) (Temporal)   Ht 5\' 5"  (1.651 m)  Wt 181 lb 9.6 oz (82.4 kg)   BMI 30.22 kg/m   General: Well-nourished, well-developed in no acute distress.  Eyes: No icterus. Conjunctivae pink. Lungs: Clear to auscultation bilaterally. Non-labored. Heart: Regular rate and rhythm, no murmurs rubs or gallops.  Abdomen: Bowel sounds are normal, positive tenderness to one finger palpation while flexing the abdominal wall muscles, nondistended, no hepatosplenomegaly or masses, no abdominal bruits or hernia , no rebound or guarding.    Extremities: No lower extremity edema. No clubbing or deformities. Neuro: Alert and oriented x 3.  Grossly intact. Skin: Warm and dry, no jaundice.   Psych: Alert and cooperative, normal mood and affect.  Labs:    Imaging Studies: No results found.  Assessment and Plan:   Danielle Strickland is a 53 y.o. y/o female who comes in for refill of her medication and will be given refills for her amities her Protonix.  The patient also reports that she has chronic abdominal pain with these episodes of excruciating pain that takes her breath away.  On exam today the patient's abdominal pain is clearly musculoskeletal and reproducible with one finger palpation while flexing the abdominal wall muscles.  The patient also reports that she has chronic back pain which quite commonly comes with abdominal wall pain due to these both being core muscles.  The patient attacks of epigastric pain may also be esophageal spasms and she has been told to try and see if anything she has done prior to the pain starting may be eliciting these attacks.  The patient has been explained the f plan and agrees with it.    Lucilla Lame, MD. [name]   Note: This dictation was prepared with Dragon dictation along with smaller phrase technology. Any transcriptional errors that result from this process are unintentional.

## 2019-07-17 ENCOUNTER — Ambulatory Visit: Payer: 59

## 2019-07-19 ENCOUNTER — Encounter (INDEPENDENT_AMBULATORY_CARE_PROVIDER_SITE_OTHER): Payer: 59 | Admitting: Ophthalmology

## 2019-07-26 ENCOUNTER — Ambulatory Visit
Admission: RE | Admit: 2019-07-26 | Discharge: 2019-07-26 | Disposition: A | Discharge status: Home / Self Care | Payer: 59 | Source: Ambulatory Visit | Attending: Family Medicine | Admitting: Family Medicine

## 2019-07-26 ENCOUNTER — Other Ambulatory Visit: Payer: Self-pay

## 2019-07-26 DIAGNOSIS — Z1231 Encounter for screening mammogram for malignant neoplasm of breast: Secondary | ICD-10-CM

## 2019-09-26 ENCOUNTER — Ambulatory Visit: Payer: 59 | Admitting: Gastroenterology

## 2019-09-26 ENCOUNTER — Encounter: Payer: Self-pay | Admitting: Gastroenterology

## 2019-09-26 ENCOUNTER — Other Ambulatory Visit: Payer: Self-pay

## 2019-09-26 VITALS — BP 127/80 | HR 88 | Temp 98.2°F | Ht 65.0 in | Wt 176.0 lb

## 2019-09-26 DIAGNOSIS — K921 Melena: Secondary | ICD-10-CM

## 2019-09-26 NOTE — Progress Notes (Signed)
Primary Care Physician: Lucille Passy, MD  Primary Gastroenterologist:  Dr. Lucilla Lame  Chief Complaint  Patient presents with  . office visit    abdominal pain, bloody diarrhea     HPI: Danielle Strickland is a 53 y.o. female here with a recent report of having abdominal discomfort in the lower abdomen.  She states that she had a in [place] at work during a party and shortly after that got sick.  She had abdominal pain with nausea without any vomiting and reports that she then had frequent bowel movements with a bloody stool.  She has a picture that showed her to have passed clots.  The patient reports that she has a family history in her mother with having colon cancer in her uncle had colon cancer at 41.  The patient also states that her sister was tested genetically for any abnormalities but she does not know the results.  The patient has not had any further bloody stools and reports that her abdominal pain is improved but not completely gone away.  Her nausea has also improved but has not gone away.  Current Outpatient Medications  Medication Sig Dispense Refill  . acyclovir (ZOVIRAX) 200 MG capsule Take 2 caps bid-tid for 5d prn 60 capsule 11  . linaclotide (LINZESS) 145 MCG CAPS capsule TAKE 1 CAPSULE BY MOUTH ONCE DAILY BEFORE BREAKFAST 30 capsule 11  . pantoprazole (PROTONIX) 40 MG tablet Take 1 tablet (40 mg total) by mouth daily. 30 tablet 11  . promethazine (PHENERGAN) 25 MG tablet Take 1q8h prn N&V with Migraines 30 tablet 3  . Promethazine-Codeine 6.25-10 MG/5ML SOLN Take 5 mLs by mouth every 8 (eight) hours as needed. 180 mL 0  . SUMAtriptan (IMITREX) 50 MG tablet Take 1 tablet (50 mg total) by mouth every 2 (two) hours as needed for migraine. 10 tablet 3  . furosemide (LASIX) 20 MG tablet Take 1 tablet (20 mg total) by mouth daily as needed. (Patient not taking: Reported on 06/27/2019) 30 tablet 2  . lovastatin (MEVACOR) 20 MG tablet Take 1 tablet (20 mg total) by mouth at  bedtime. (Patient not taking: Reported on 06/27/2019) 90 tablet 1  . sertraline (ZOLOFT) 50 MG tablet Take 0.5 tablets (25 mg total) by mouth 2 (two) times daily. (Patient not taking: Reported on 06/27/2019) 30 tablet 3   No current facility-administered medications for this visit.     Allergies as of 09/26/2019 - Review Complete 09/26/2019  Allergen Reaction Noted  . Augmentin [amoxicillin-pot clavulanate] Nausea And Vomiting and Other (See Comments) 09/07/2012  . Codeine Nausea And Vomiting 09/07/2012  . Latex Rash 09/07/2012  . Aspirin Nausea And Vomiting 11/25/2015  . Neosporin [neomycin-bacitracin zn-polymyx]  09/24/2016    ROS:  General: Negative for anorexia, weight loss, fever, chills, fatigue, weakness. ENT: Negative for hoarseness, difficulty swallowing , nasal congestion. CV: Negative for chest pain, angina, palpitations, dyspnea on exertion, peripheral edema.  Respiratory: Negative for dyspnea at rest, dyspnea on exertion, cough, sputum, wheezing.  GI: See history of present illness. GU:  Negative for dysuria, hematuria, urinary incontinence, urinary frequency, nocturnal urination.  Endo: Negative for unusual weight change.    Physical Examination:   BP 127/80 (BP Location: Right Arm, Patient Position: Sitting)   Pulse 88   Temp 98.2 F (36.8 C) (Oral)   Ht 5\' 5"  (1.651 m)   Wt 176 lb (79.8 kg)   BMI 29.29 kg/m   General: Well-nourished, well-developed in no acute distress.  Eyes: No icterus. Conjunctivae pink. Lungs: Clear to auscultation bilaterally. Non-labored. Heart: Regular rate and rhythm, no murmurs rubs or gallops.  Abdomen: Bowel sounds are normal, nontender, nondistended, no hepatosplenomegaly or masses, no abdominal bruits or hernia , no rebound or guarding.   Extremities: No lower extremity edema. No clubbing or deformities. Neuro: Alert and oriented x 3.  Grossly intact. Skin: Warm and dry, no jaundice.   Psych: Alert and cooperative, normal mood and  affect.  Labs:    Imaging Studies: No results found.  Assessment and Plan:   Danielle Strickland is a 53 y.o. y/o female who comes in today with a report of a recent episode of nausea with lower abdominal pain rectal bleeding headache and lightheadedness.  The patient likely had some foodborne illness and has been getting better although she has not completely returned to normal.  The patient has a family history of colon cancer and did have rectal bleeding.  The patient is due for hersurveillance colonoscopy.I have discussed risks & benefits which include, but are not limited to, bleeding, infection, perforation & drug reaction.  The patient agrees with this plan & written consent will be obtained.        Lucilla Lame, MD. [name]    Note: This dictation was prepared with Dragon dictation along with smaller phrase technology. Any transcriptional errors that result from this process are unintentional.

## 2019-09-28 ENCOUNTER — Other Ambulatory Visit: Payer: Self-pay | Admitting: Family Medicine

## 2019-10-05 ENCOUNTER — Telehealth: Payer: Self-pay | Admitting: Gastroenterology

## 2019-10-05 NOTE — Telephone Encounter (Signed)
Patient called & l/m on v/m asking for Ginger to call her about the Codine for the colonoscopy.

## 2019-10-12 ENCOUNTER — Telehealth: Payer: Self-pay

## 2019-10-12 ENCOUNTER — Other Ambulatory Visit: Payer: Self-pay

## 2019-10-12 NOTE — Telephone Encounter (Signed)
Per Dr. Dorothey Baseman 09/26/19 office note patient is due for her surveillance colonoscopy.  Patient is concerned about the cost of her colonoscopy.  Explained to patient that based on her 2016 colonoscopy report a sessile polyp was noted and her repeat colonoscopy would be coded as:  personal history of colon polyps ICD Code:Z86.010, and high risk colon cancer surveillance ICD Code:  Z91.89, Z12.11.  She will check with her insurance and get back to me in regards to scheduling.  A copy of her 2016 colonoscopy report has been mailed to her for her records.  Thanks Alyssia Heese

## 2019-10-12 NOTE — Telephone Encounter (Signed)
Pt is needing to schedule her colonoscopy. Please let her know per Dr. Allen Norris, it will be considered a surveillance not screening due to previous colonoscopies.

## 2019-10-12 NOTE — Telephone Encounter (Signed)
LVM for pt to call office in regards to scheduling her colonoscopy.  Thanks [employer]

## 2019-10-16 ENCOUNTER — Telehealth: Payer: Self-pay | Admitting: Gastroenterology

## 2019-10-16 NOTE — Telephone Encounter (Signed)
Pt is calling she has [employer] now and her rx Linzess 145 mg needs Prior authorization.

## 2019-10-16 NOTE — Telephone Encounter (Signed)
Sent PA on Linzess through cover my meds

## 2019-10-17 MED ORDER — TRULANCE 3 MG PO TABS: 3.0000 mg | ORAL_TABLET | Freq: Every day | ORAL | 1 refills | Status: DC

## 2019-10-17 NOTE — Addendum Note (Signed)
Addended by: Ulyess Blossom L on: 10/17/2019 10:42 AM   Modules accepted: Orders

## 2019-10-17 NOTE — Telephone Encounter (Signed)
Patient insurance company denied the [place] 145mg . They want patient to try Trulance

## 2019-10-17 NOTE — Telephone Encounter (Signed)
Sent medication for the Tulance to the pharmacy. Tried to call patient but mailbox was full. Sent patient a Therapist, music

## 2019-10-17 NOTE — Telephone Encounter (Signed)
Called and left a message for call back  

## 2019-10-17 NOTE — Telephone Encounter (Signed)
That would be fine!  Thanks! 

## 2019-10-17 NOTE — Telephone Encounter (Signed)
Pt  Left vm she is  returning your call

## 2019-10-18 NOTE — Telephone Encounter (Signed)
Called the patient back Patient states she does not want to change medication since she has been on linzess for years. Patient is going to call [employer] her self

## 2019-10-23 ENCOUNTER — Encounter: Payer: Self-pay | Admitting: Nurse Practitioner

## 2019-10-23 ENCOUNTER — Ambulatory Visit (INDEPENDENT_AMBULATORY_CARE_PROVIDER_SITE_OTHER): Payer: BC Managed Care – PPO | Admitting: Family Medicine

## 2019-10-23 ENCOUNTER — Telehealth (INDEPENDENT_AMBULATORY_CARE_PROVIDER_SITE_OTHER): Payer: BC Managed Care – PPO | Admitting: Nurse Practitioner

## 2019-10-23 ENCOUNTER — Other Ambulatory Visit: Payer: Self-pay

## 2019-10-23 VITALS — Temp 98.7°F | Ht 65.0 in | Wt 172.0 lb

## 2019-10-23 VITALS — BP 106/70 | HR 98 | Temp 98.9°F | Ht 65.0 in | Wt 173.0 lb

## 2019-10-23 DIAGNOSIS — R6889 Other general symptoms and signs: Secondary | ICD-10-CM

## 2019-10-23 DIAGNOSIS — R05 Cough: Secondary | ICD-10-CM

## 2019-10-23 DIAGNOSIS — Z20822 Contact with and (suspected) exposure to covid-19: Secondary | ICD-10-CM

## 2019-10-23 DIAGNOSIS — R5383 Other fatigue: Secondary | ICD-10-CM | POA: Diagnosis not present

## 2019-10-23 DIAGNOSIS — R059 Cough, unspecified: Secondary | ICD-10-CM

## 2019-10-23 DIAGNOSIS — J029 Acute pharyngitis, unspecified: Secondary | ICD-10-CM

## 2019-10-23 DIAGNOSIS — Z20828 Contact with and (suspected) exposure to other viral communicable diseases: Secondary | ICD-10-CM | POA: Diagnosis not present

## 2019-10-23 MED ORDER — GUAIFENESIN-CODEINE 100-10 MG/5ML PO SYRP: 5.0000 mL | ORAL_SOLUTION | Freq: Three times a day (TID) | ORAL | 0 refills | Status: DC | PRN

## 2019-10-23 MED ORDER — AZITHROMYCIN 250 MG PO TABS: ORAL_TABLET | ORAL | 0 refills | Status: DC

## 2019-10-23 MED ORDER — PROMETHAZINE-CODEINE 6.25-10 MG/5ML PO SYRP: 5.0000 mL | ORAL_SOLUTION | Freq: Three times a day (TID) | ORAL | 0 refills | Status: DC | PRN

## 2019-10-23 MED ORDER — CEFDINIR 300 MG PO CAPS: 600.0000 mg | ORAL_CAPSULE | Freq: Every day | ORAL | 0 refills | Status: DC

## 2019-10-23 NOTE — Progress Notes (Signed)
Patient ID: [name], female    DOB: 02-Mar-1966, 53 y.o.   MRN: [id number]  PCP: Lucille Passy, MD  No chief complaint on file.   Subjective:  HPI Danielle Strickland is a 53 y.o. female presents for evaluation of fatigue and respiratory symptoms following a virtual encounter with PCP earlier today.   Patient reports develop an acute onset of fatigue, body aches, and cough x 3 days ago while visiting with daughter. Daughter was diagnosed with strep throat and pt was visiting to care for grandchildren. Symptom initially were fatigue and non-productive cough. Today complains of soreness of throat mostly localized to right side of throat. Denies loss of sense of taste or smell. Afebrile. She has not taken any antipyretics since yesterday. Continues to have cough. Denies SOB. Patient  also explains provider earlier today advised her to purchase OTC cough medication and she is allergic antitussive preparations used in Robitussin other forms of OTC cough suppressants. Patient is able to tolerate promethazine with codeine. Confirms she is able to tolerate codeine with cough medicine although can't tolerate in other forms or high doses. Patient works at a [employer] and interacts with the public, although exercises social distancing and precautions. She has not had known exposure to COVID-19. Feels her symptoms today are similar to influenza she was diagnosed with earlier this year. Social History   Socioeconomic History  . Marital status: Divorced    Spouse name: Not on file  . Number of children: Not on file  . Years of education: Not on file  . Highest education level: Not on file  Occupational History  . Not on file  Tobacco Use  . Smoking status: Former Smoker    Packs/day: 1.00    Types: Cigarettes    Quit date: 03/03/2015    Years since quitting: 4.6  . Smokeless tobacco: Never Used  . Tobacco comment: Quit May 8  Substance and Sexual Activity  . Alcohol use: No    Alcohol/week:  0.0 standard drinks  . Drug use: No  . Sexual activity: Not Currently    Birth control/protection: Surgical    Comment: hyst/BTL   Other Topics Concern  . Not on file  Social History Narrative   [phone]   Married to fourth husband   Social Determinants of Health   Financial Resource Strain:   . Difficulty of Paying Living Expenses: Not on file  Food Insecurity:   . Worried About Charity fundraiser in the Last Year: Not on file  . Ran Out of Food in the Last Year: Not on file  Transportation Needs:   . Lack of Transportation (Medical): Not on file  . Lack of Transportation (Non-Medical): Not on file  Physical Activity:   . Days of Exercise per Week: Not on file  . Minutes of Exercise per Session: Not on file  Stress:   . Feeling of Stress : Not on file  Social Connections:   . Frequency of Communication with Friends and Family: Not on file  . Frequency of Social Gatherings with Friends and Family: Not on file  . Attends Religious Services: Not on file  . Active Member of Clubs or Organizations: Not on file  . Attends Archivist Meetings: Not on file  . Marital Status: Not on file  Intimate Partner Violence:   . Fear of Current or Ex-Partner: Not on file  . Emotionally Abused: Not on file  . Physically Abused: Not on file  .  Sexually Abused: Not on file    Family History  Problem Relation Age of Onset  . Cancer Mother        colon   . Breast cancer Maternal Grandmother   . Thyroid cancer Maternal Aunt   . Breast cancer Maternal Aunt   . Cancer Paternal Aunt        colon   Review of Systems Pertinent negatives listed in HPI   Prior to Admission medications   Medication Sig Start Date End Date Taking? Authorizing Provider  acyclovir (ZOVIRAX) 200 MG capsule TAKE 2 CAPSULES BY MOUTH 2 TO 3 TIMES DAILY FOR 5 DAYS AS NEEDED 09/28/19   Lucille Passy, MD  azithromycin (ZITHROMAX) 250 MG tablet Take 2 tabs PO x 1 dose, then 1 tab PO QD x 4 days 10/23/19    Scot Jun, FNP  pantoprazole (PROTONIX) 40 MG tablet Take 1 tablet (40 mg total) by mouth daily. 06/27/19   Lucilla Lame, MD  Plecanatide (TRULANCE) 3 MG TABS Take 3 mg by mouth daily. 10/17/19   Lucilla Lame, MD  promethazine-codeine (PHENERGAN WITH CODEINE) 6.25-10 MG/5ML syrup Take 5 mLs by mouth every 8 (eight) hours as needed for cough. 10/23/19   Scot Jun, FNP  SUMAtriptan (IMITREX) 50 MG tablet Take 1 tablet (50 mg total) by mouth every 2 (two) hours as needed for migraine. 04/06/18   Lucille Passy, MD    Past Medical, Surgical Family and Social History reviewed and updated.    Objective:   Today's Vitals   10/23/19 1824 10/23/19 1953  BP: 106/70   Pulse: (!) 108 98  Temp: 98.9 F (37.2 C)   SpO2: 97% 99%  Weight: 78.5 kg   Height: 5\' 5"  (1.651 m)     BP Readings from Last 3 Encounters:  10/23/19 106/70  09/26/19 127/80  06/27/19 99/64    Filed Weights   10/23/19 1824  Weight: 78.5 kg      Physical Exam Vitals reviewed.  Constitutional:      Appearance: She is well-developed. She is ill-appearing.  HENT:     Head: Normocephalic.     Right Ear: External ear normal.     Left Ear: External ear normal.     Nose: Nose normal.     Mouth/Throat:     Lips: Pink.     Tongue: No lesions.     Pharynx: Posterior oropharyngeal erythema and uvula swelling present. No oropharyngeal exudate.     Tonsils: No tonsillar exudate or tonsillar abscesses. 3+ on the right. 3+ on the left.  Cardiovascular:     Rate and Rhythm: Normal rate and regular rhythm.  Pulmonary:     Effort: Pulmonary effort is normal.     Breath sounds: No wheezing or rhonchi.  Chest:     Chest wall: Tenderness present.  Musculoskeletal:     Cervical back: No rigidity or tenderness.  Lymphadenopathy:     Cervical: Cervical adenopathy present.  Skin:    General: Skin is warm and dry.  Neurological:     General: No focal deficit present.     Mental Status: She is alert.   Psychiatric:        Mood and Affect: Mood normal.     Assessment & Plan:  1. Other fatigue, in the presence of body-aches, cough, and sore throat. Suspicious for possible COVID or influenza. Influenza testing unavailable. COVID-19 testing pending. Advised Influenza vs COVID, management for acute illness would be the same as currently  prescribed. Increase fluids, rest, and management of symptoms.   2. Encounter for laboratory testing for COVID-19 virus, symptoms present. Work note provided while result are pending. Recommend initiating the following to reduce duration of symptoms: Vitamin D 5,000 IU daily Vitamin C 500 mg twice daily Zinc 50 mg daily - Novel Coronavirus, NAA ([facility]), pendning  3. Pharyngitis, unspecified etiology, suspicious for streptococcal infection. Rapid strep unavailable at site today. Treating empirically for strep infection. Penicillin allergy, Azithromycin ZPAK ordered. - Culture, Group A Strep, pending  4. Cough, unknown etiology. Novel Coronavirus test pending, Promethazine-codeine, 5 ml every 8 hours as needed for cough , caution medication may cause drowsiness.  Covid-19 discharge education provided. Patient notified she will be call regarding any positive lab results. All negative results, will be sent directly to her active MyChart.     Molli Barrows, FNP-C [facility], PRN Provider  [facility]. [place], [place] [facility] Phone: [phone] Clinic Fax: [phone] Clinic Hours: 5:30 pm -7:30 pm (Monday, Wednesday, and Friday)

## 2019-10-23 NOTE — Progress Notes (Signed)
Virtual Visit via Video Note  I connected with Danielle Strickland on 10/24/19 at  1:30 PM EST by a video enabled telemedicine application and verified that I am speaking with the correct person using two identifiers.  Location: Patient:home Provider:office Participants: patient and provider I discussed the limitations of evaluation and management by telemedicine and the availability of in person appointments. The patient expressed understanding and agreed to proceed.  CC: pt is c/o sore throat,dry cough,bodyache,feel like freezing/going on 2 days/took ibuprofen/ FYI--daughter dx with strep throat over the weekend and pt is going back to work tomorrow.   History of Present Illness: reports sick contact: influenza exposure 1week ago. 39yrs old daughter diagnosed with strep but denies sharing any personal items with her. Daughter was isolated in her room. Grandchildren are not sick. Sore Throat  This is a new problem. The current episode started yesterday. The problem has been unchanged. The maximum temperature recorded prior to her arrival was 100.4 - 100.9 F. Associated symptoms include congestion, coughing, ear pain and headaches. Pertinent negatives include no diarrhea, drooling, ear discharge, hoarse voice, plugged ear sensation, neck pain, shortness of breath, stridor, swollen glands, trouble swallowing or vomiting. She has had exposure to strep. She has had no exposure to mono. She has tried nothing for the symptoms.   Observations/Objective: Physical Exam  Constitutional: She is oriented to person, place, and time. No distress.  Pulmonary/Chest: Effort normal.  Musculoskeletal:     Cervical back: Normal range of motion and neck supple.  Neurological: She is alert and oriented to person, place, and time.  Psychiatric: She has a normal mood and affect. Her behavior is normal.   Assessment and Plan: [name] was seen today for sore throat.  Diagnoses and all orders for this  visit:  Flu-like symptoms -     Discontinue: guaiFENesin-codeine (ROBITUSSIN AC) 100-10 MG/5ML syrup; Take 5 mLs by mouth 3 (three) times daily as needed for cough.   Follow Up Instructions: See avs   I discussed the assessment and treatment plan with the patient. The patient was provided an opportunity to ask questions and all were answered. The patient agreed with the plan and demonstrated an understanding of the instructions.   The patient was advised to call back or seek an in-person evaluation if the symptoms worsen or if the condition fails to improve as anticipated.  Wilfred Lacy, NP

## 2019-10-23 NOTE — Patient Instructions (Addendum)
You will be notified via phone of any abnormal lab results. All normal or negative results will be sent directly to your MyChart account. For management of viral illness, I recommend: Recommendation management of viral illness include: Vitamin D 5,000 IU daily Vitamin C 500 mg twice daily Zinc 50 mg daily  I have sent promethazine-codeine and azithromycin for suspected Strep throat infection.    This information is directly available on the CDC website: RunningShows.co.za.html    Source:CDC Reference to specific commercial products, manufacturers, companies, or trademarks does not constitute its endorsement or recommendation by the [place], [place], or Centers for [employer] and Prevention.

## 2019-10-23 NOTE — Patient Instructions (Addendum)
She declined to go to [facility] for COVID test. She prefers to get flu test first. If negative then she will schedule for COVID test. She was informed about her appt at 5:30pm today. She was provided with address of respiratory clinic site.  Maintain adequate oral hydration Alternate between tylenol 500mg  and ibuprofen 400mg  every 6hrs as needed for fever and bodyaches.  This information is directly available on the CDC website: RunningShows.co.za.html    Source:CDC Reference to specific commercial products, manufacturers, companies, or trademarks does not constitute its endorsement or recommendation by the [place], [place], or Centers for [employer] and Prevention.

## 2019-10-25 ENCOUNTER — Other Ambulatory Visit: Payer: Self-pay | Admitting: Family Medicine

## 2019-10-25 ENCOUNTER — Telehealth: Payer: Self-pay | Admitting: Family Medicine

## 2019-10-25 ENCOUNTER — Telehealth: Payer: Self-pay | Admitting: Nurse Practitioner

## 2019-10-25 LAB — NOVEL CORONAVIRUS, NAA: SARS-CoV-2, NAA: DETECTED — AB

## 2019-10-25 MED ORDER — ALBUTEROL SULFATE HFA 108 (90 BASE) MCG/ACT IN AERS: 2.0000 | INHALATION_SPRAY | RESPIRATORY_TRACT | 1 refills | Status: DC | PRN

## 2019-10-25 NOTE — Telephone Encounter (Signed)
Attempted to reach patient via phone to notify her of positive lab result. Pt was tested for COVID-19 at the Respiratory Clinic on 10/23/19 after being referred following a telemedicine encounter which occurred the same day. I am unable to leave message  as her voicemail is full. Sending a message to PCP and Site Lead to make efforts to reach patient. I will continue to make efforts to reach patient as she will need a letter writing her out of work due to positive result.  Molli Barrows, FNP-C [facility], PRN Provider  [facility]. [place], [place] [facility] Phone: [phone] Clinic Fax: [phone] Clinic Hours: 5:30 pm -7:30 pm (Monday, Wednesday, and Friday)

## 2019-10-25 NOTE — Telephone Encounter (Signed)
Called Ms. Kawashima to inform her about COVID results. She stated, she has already been contacted by Jacob Moores, NP. She has also notified her daughter about diagnosis. She did not have any additional questions and verbalized understanding of Ms. Kenton Kingfisher' instructions.

## 2019-10-25 NOTE — Progress Notes (Signed)
Spoke with patient discussed positive COVID19 results. Patient continues to be symptomatic with cough and chest tightness generally feeling poorly. Offered appointment in respiratory clinic this evening, patient declined but will call clinic directly if symptoms worsen. Albuterol inhaler sent to pharmacy. Work note sent via [place] extending leave until 10/30/19 if symptoms resolve. Pt advised to follow-up at respiratory clinic 10/30/19 if no improvement of symptoms.  Molli Barrows, FNP-C [facility], PRN Provider  [facility]. [place], [place] [facility] Phone: [phone] Clinic Fax: [phone] Clinic Hours: 5:30 pm -7:30 pm (Monday, Wednesday, and Friday)

## 2019-10-25 NOTE — Progress Notes (Signed)
Attempted to reach patient via phone to notify her of positive lab result. Pt was tested for COVID-19 at the Respiratory Clinic on 10/23/19 after being referred following a telemedicine encounter which occurred the same day. I am unable to leave message patient a message as her voicemail is full. Sending a message to PCP and Site Lead to make efforts to reach patient. I will continue to make efforts to reach patient as she will need a letter writing her out of work due to positive result.

## 2019-10-25 NOTE — Telephone Encounter (Signed)
Engineer, building services received call from [employer], [employer].  He stated patient was tested for Covid and strep.  Covid results were positive and NP at Clinic has not been able to reach the patient.   Requested Baldo Ash Nche call patient as follow-up.  Strep results are not back.

## 2019-10-26 LAB — SPECIMEN STATUS REPORT

## 2019-10-27 LAB — CULTURE, GROUP A STREP: Strep A Culture: NEGATIVE

## 2019-10-30 ENCOUNTER — Telehealth: Payer: Self-pay | Admitting: Family Medicine

## 2019-10-30 DIAGNOSIS — U071 COVID-19: Secondary | ICD-10-CM | POA: Insufficient documentation

## 2019-10-30 DIAGNOSIS — R0602 Shortness of breath: Secondary | ICD-10-CM

## 2019-10-30 NOTE — Telephone Encounter (Signed)
I am fine with that but I am not sure how to schedule a CXR at [facility].  I'll put the order in but beyond that, I'll need your help!  Thanks so much!

## 2019-10-30 NOTE — Telephone Encounter (Signed)
Please see message and advise.  Thank you. ° °

## 2019-10-30 NOTE — Progress Notes (Unsigned)
Virtual Visit via Video   Due to the COVID-19 pandemic, this visit was completed with telemedicine (audio/video) technology to reduce patient and provider exposure as well as to preserve personal protective equipment.   I connected with Danielle Strickland by a video enabled telemedicine application and verified that I am speaking with the correct person using two identifiers. Location patient: Home Location provider: [facility], Office Persons participating in the virtual visit: Danielle Strickland, Danielle Norris, MD   I discussed the limitations of evaluation and management by telemedicine and the availability of in person appointments. The patient expressed understanding and agreed to proceed.  Care Team   Patient Care Team: Lucille Passy, MD as PCP - General (Family Medicine)  Subjective:   HPI:   Patient called today and left the following message:  Patient called saying that she tested positive for COVID-19. Patient was seen by Baldo Ash and Dr. Kenton Kingfisher from [place] last week. Patient states that she was instructed by Dr. Kenton Kingfisher that if she didn't feel good by today that she needs a xray done. I did let patient know that because she just tested positive that she would not be able to come in the office. Patient is schedule to see Dr. Deborra Medina tomorrow through her mychart but she wanted to get an order for an xray sent to [facility] so she can get this done today. Please advise.   I ordered the CXR to be done at [facility].   Review of Systems  Constitutional: Negative for fever and malaise/fatigue.  HENT: Negative for congestion and hearing loss.   Eyes: Negative for blurred vision, discharge and redness.  Respiratory: Negative for cough and shortness of breath.   Cardiovascular: Negative for chest pain, palpitations and leg swelling.  Gastrointestinal: Negative for abdominal pain and heartburn.  Genitourinary: Negative for dysuria.  Musculoskeletal: Negative for falls.  Skin: Negative  for rash.  Neurological: Negative for loss of consciousness and headaches.  Endo/Heme/Allergies: Does not bruise/bleed easily.  Psychiatric/Behavioral: Negative for depression.  All other systems reviewed and are negative.    Patient Active Problem List   Diagnosis Date Noted  . COVID-19 virus infection 10/30/2019  . SOB (shortness of breath) 08/09/2018  . Palpitation 07/22/2018  . HLD (hyperlipidemia) 05/10/2018  . Elevated TSH 05/10/2018  . Restless legs syndrome (RLS) 04/06/2018  . Anxiety 02/01/2018  . Gastroesophageal reflux disease 02/01/2018  . Inflammatory disease of liver 02/01/2018  . Menopausal syndrome 02/01/2018  . Migraine 02/01/2018  . Recurrent hematuria 02/01/2018  . Complex regional pain syndrome type 1 affecting right lower leg 09/03/2016  . PTSD (post-traumatic stress disorder) 03/02/2016  . Malleolar fracture 12/10/2015  . Hx MRSA infection 06/18/2015  . Abnormal chest x-ray 05/29/2015  . Family history of colon cancer 02/21/2013  . TMJ (dislocation of temporomandibular joint) 02/21/2013  . Allergy   . Skin cancer   . Peptic ulcer disease   . Generalized anxiety disorder   . Herpes simplex without mention of complication   . IBS (irritable bowel syndrome)   . History of migraine     Social History   Tobacco Use  . Smoking status: Former Smoker    Packs/day: 1.00    Types: Cigarettes    Quit date: 03/03/2015    Years since quitting: 4.6  . Smokeless tobacco: Never Used  . Tobacco comment: Quit May 8  Substance Use Topics  . Alcohol use: No    Alcohol/week: 0.0 standard drinks    Current Outpatient  Medications:  .  acyclovir (ZOVIRAX) 200 MG capsule, TAKE 2 CAPSULES BY MOUTH 2 TO 3 TIMES DAILY FOR 5 DAYS AS NEEDED, Disp: 60 capsule, Rfl: 0 .  albuterol (VENTOLIN HFA) 108 (90 Base) MCG/ACT inhaler, Inhale 2 puffs into the lungs every 4 (four) hours as needed for wheezing or shortness of breath., Disp: 8 g, Rfl: 1 .  azithromycin (ZITHROMAX) 250 MG  tablet, Take 2 tabs PO x 1 dose, then 1 tab PO QD x 4 days, Disp: 6 tablet, Rfl: 0 .  pantoprazole (PROTONIX) 40 MG tablet, Take 1 tablet (40 mg total) by mouth daily., Disp: 30 tablet, Rfl: 11 .  Plecanatide (TRULANCE) 3 MG TABS, Take 3 mg by mouth daily., Disp: 30 tablet, Rfl: 1 .  promethazine-codeine (PHENERGAN WITH CODEINE) 6.25-10 MG/5ML syrup, Take 5 mLs by mouth every 8 (eight) hours as needed for cough., Disp: 120 mL, Rfl: 0 .  SUMAtriptan (IMITREX) 50 MG tablet, Take 1 tablet (50 mg total) by mouth every 2 (two) hours as needed for migraine., Disp: 10 tablet, Rfl: 3  Allergies  Allergen Reactions  . Amoxicillin-Pot Clavulanate Nausea And Vomiting and Other (See Comments)    Has patient had a PCN reaction causing immediate rash, facial/tongue/throat swelling, SOB or lightheadedness with hypotension: No Has patient had a PCN reaction causing severe rash involving mucus membranes or skin necrosis: No Has patient had a PCN reaction that required hospitalization No Has patient had a PCN reaction occurring within the last 10 years: No If all of the above answers are "NO", then may proceed with Cephalosporin use. Other reaction(s): Vomiting  . Codeine Nausea And Vomiting  . Latex Rash  . Aspirin Nausea And Vomiting  . Neosporin [Neomycin-Bacitracin Zn-Polymyx]     Objective:  There were no vitals taken for this visit.  VITALS: Per patient if applicable, see vitals. GENERAL: Alert, appears well and in no acute distress. HEENT: Atraumatic, conjunctiva clear, no obvious abnormalities on inspection of external nose and ears. NECK: Normal movements of the head and neck. CARDIOPULMONARY: No increased WOB. Speaking in clear sentences. I:E ratio WNL.  MS: Moves all visible extremities without noticeable abnormality. PSYCH: Pleasant and cooperative, well-groomed. Speech normal rate and rhythm. Affect is appropriate. Insight and judgement are appropriate. Attention is focused, linear, and  appropriate.  NEURO: CN grossly intact. Oriented as arrived to appointment on time with no prompting. Moves both UE equally.  SKIN: No obvious lesions, wounds, erythema, or cyanosis noted on face or hands.  Depression screen [facility] 2/9 07/22/2018 04/06/2018  Decreased Interest 0 0  Down, Depressed, Hopeless 1 1  PHQ - 2 Score 1 1  Altered sleeping 3 3  Tired, decreased energy 2 3  Change in appetite 2 3  Feeling bad or failure about yourself  0 1  Trouble concentrating 1 1  Moving slowly or fidgety/restless 2 0  Suicidal thoughts 0 0  PHQ-9 Score 11 12  Difficult doing work/chores - Very difficult     . COVID-19 Education: The signs and symptoms of COVID-19 were discussed with the patient and how to seek care for testing if needed. The importance of social distancing was discussed today. . Reviewed expectations re: course of current medical issues. . Discussed self-management of symptoms. . Outlined signs and symptoms indicating need for more acute intervention. . Patient verbalized understanding and all questions were answered. [name] Health Maintenance issues including appropriate healthy diet, exercise, and smoking avoidance were discussed with patient. . See orders for this visit  as documented in the electronic medical record.  Danielle Norris, MD  Records requested if needed. Time spent: *** minutes, of which >50% was spent in obtaining information about her symptoms, reviewing her previous labs, evaluations, and treatments, counseling her about her condition (please see the discussed topics above), and developing a plan to further investigate it; she had a number of questions which I addressed.   Lab Results  Component Value Date   WBC 5.8 08/09/2018   HGB 13.1 08/09/2018   HCT 38.2 08/09/2018   PLT 283.0 08/09/2018   GLUCOSE 105 (H) 08/09/2018   CHOL 215 (H) 07/07/2018   TRIG 196.0 (H) 07/07/2018   HDL 43.90 07/07/2018   LDLDIRECT 132.0 05/10/2018   LDLCALC 132 (H) 07/07/2018    ALT 12 08/09/2018   AST 12 08/09/2018   NA 140 08/09/2018   K 4.0 08/09/2018   CL 105 08/09/2018   CREATININE 0.67 08/09/2018   BUN 14 08/09/2018   CO2 27 08/09/2018   TSH 2.85 08/09/2018    Lab Results  Component Value Date   TSH 2.85 08/09/2018   Lab Results  Component Value Date   WBC 5.8 08/09/2018   HGB 13.1 08/09/2018   HCT 38.2 08/09/2018   MCV 92.7 08/09/2018   PLT 283.0 08/09/2018   Lab Results  Component Value Date   NA 140 08/09/2018   K 4.0 08/09/2018   CO2 27 08/09/2018   GLUCOSE 105 (H) 08/09/2018   BUN 14 08/09/2018   CREATININE 0.67 08/09/2018   BILITOT 0.4 08/09/2018   ALKPHOS 104 08/09/2018   AST 12 08/09/2018   ALT 12 08/09/2018   PROT 7.6 08/09/2018   ALBUMIN 4.4 08/09/2018   CALCIUM 9.4 08/09/2018   ANIONGAP 13 11/26/2015   GFR 97.22 08/09/2018   Lab Results  Component Value Date   CHOL 215 (H) 07/07/2018   Lab Results  Component Value Date   HDL 43.90 07/07/2018   Lab Results  Component Value Date   LDLCALC 132 (H) 07/07/2018   Lab Results  Component Value Date   TRIG 196.0 (H) 07/07/2018   Lab Results  Component Value Date   CHOLHDL 5 07/07/2018   No results found for: HGBA1C     Assessment & Plan:   Problem List Items Addressed This Visit      Active Problems   COVID-19 virus infection      I am having Yarden P. Espana maintain her SUMAtriptan, pantoprazole, acyclovir, Trulance, promethazine-codeine, azithromycin, and albuterol.  No orders of the defined types were placed in this encounter.    Danielle Norris, MD

## 2019-10-30 NOTE — Telephone Encounter (Addendum)
Please see message and advise.  Thank you. ° °

## 2019-10-30 NOTE — Telephone Encounter (Signed)
Patient called saying that she tested positive for COVID-19. Patient was seen by Baldo Ash and Dr. Kenton Kingfisher from [place] last week. Patient states that she was instructed by Dr. Kenton Kingfisher that if she didn't feel good by today that she needs a xray done. I did let patient know that because she just tested positive that she would not be able to come in the office. Patient is schedule to see Dr. Deborra Medina tomorrow through her mychart but she wanted to get an order for an xray sent to [facility] so she can get this done today. Please advise.

## 2019-10-31 ENCOUNTER — Telehealth: Payer: BC Managed Care – PPO | Admitting: Family Medicine

## 2019-10-31 ENCOUNTER — Telehealth: Payer: Self-pay | Admitting: Family Medicine

## 2019-10-31 ENCOUNTER — Ambulatory Visit
Admission: RE | Admit: 2019-10-31 | Discharge: 2019-10-31 | Disposition: A | Discharge status: Home / Self Care | Payer: BC Managed Care – PPO | Source: Ambulatory Visit | Attending: Family Medicine | Admitting: Family Medicine

## 2019-10-31 ENCOUNTER — Encounter: Payer: Self-pay | Admitting: Family Medicine

## 2019-10-31 ENCOUNTER — Other Ambulatory Visit: Payer: Self-pay | Admitting: Family Medicine

## 2019-10-31 ENCOUNTER — Other Ambulatory Visit: Payer: Self-pay

## 2019-10-31 DIAGNOSIS — R0602 Shortness of breath: Secondary | ICD-10-CM | POA: Insufficient documentation

## 2019-10-31 DIAGNOSIS — U071 COVID-19: Secondary | ICD-10-CM

## 2019-10-31 DIAGNOSIS — J1282 Pneumonia due to coronavirus disease 2019: Secondary | ICD-10-CM | POA: Diagnosis not present

## 2019-10-31 NOTE — Telephone Encounter (Signed)
Patient is calling and stated that she is still experiencing headaches and chills. Also patient stated that her left lung is sore.

## 2019-10-31 NOTE — Telephone Encounter (Signed)
Patient is calling and stated that she is still expericing headaches and chills. Pt also states that her left lung hurts and if there is anything she can do to stop the pain.

## 2019-10-31 NOTE — Telephone Encounter (Signed)
error 

## 2019-10-31 NOTE — Telephone Encounter (Signed)
This has been addressed.  Patient had xray performed yesterday.

## 2019-11-01 NOTE — Telephone Encounter (Signed)
This call has been addressed by Guido Sander, per Dr.Aron.

## 2019-11-01 NOTE — Progress Notes (Deleted)
Virtual Visit via Video   Due to the COVID-19 pandemic, this visit was completed with telemedicine (audio/video) technology to reduce patient and provider exposure as well as to preserve personal protective equipment.   I connected with Danielle Strickland by a video enabled telemedicine application and verified that I am speaking with the correct person using two identifiers. Location patient: Home Location provider: [facility], Office Persons participating in the virtual visit: [name], Arnette Norris, MD   I discussed the limitations of evaluation and management by telemedicine and the availability of in person appointments. The patient expressed understanding and agreed to proceed.  Care Team   Patient Care Team: Lucille Passy, MD as PCP - General (Family Medicine)  Subjective:   HPI:  Patient called and left the following message:  Patient called saying that she tested positive for COVID-19. Patient was seen by Baldo Ash and Dr. Kenton Kingfisher from [place] last week. Patient states that she was instructed by Dr. Kenton Kingfisher that if she didn't feel good by today that she needs a xray done. I did let patient know that because she just tested positive that she would not be able to come in the office. Patient is schedule to see Dr. Deborra Medina tomorrow through her mychart but she wanted to get an order for an xray sent to [facility] so she can get this done today. Please advise.  I ordered the CXR to be done at [facility] which showed:  CLINICAL DATA:  Follow-up COVID pneumonia  EXAM: CHEST - 2 VIEW  COMPARISON:  08/09/2018  FINDINGS: The heart size and mediastinal contours are within normal limits. Both lungs are clear. The visualized skeletal structures are unremarkable.  IMPRESSION: No active cardiopulmonary disease.   Electronically Signed   By: Kathreen Devoid   On: 10/31/2019 10:55    ROS   Patient Active Problem List   Diagnosis Date Noted  . COVID-19 virus  infection 10/30/2019  . SOB (shortness of breath) 08/09/2018  . Palpitation 07/22/2018  . HLD (hyperlipidemia) 05/10/2018  . Elevated TSH 05/10/2018  . Restless legs syndrome (RLS) 04/06/2018  . Anxiety 02/01/2018  . Gastroesophageal reflux disease 02/01/2018  . Inflammatory disease of liver 02/01/2018  . Menopausal syndrome 02/01/2018  . Migraine 02/01/2018  . Recurrent hematuria 02/01/2018  . Complex regional pain syndrome type 1 affecting right lower leg 09/03/2016  . PTSD (post-traumatic stress disorder) 03/02/2016  . Malleolar fracture 12/10/2015  . Hx MRSA infection 06/18/2015  . Abnormal chest x-ray 05/29/2015  . Family history of colon cancer 02/21/2013  . TMJ (dislocation of temporomandibular joint) 02/21/2013  . Allergy   . Skin cancer   . Peptic ulcer disease   . Generalized anxiety disorder   . Herpes simplex without mention of complication   . IBS (irritable bowel syndrome)   . History of migraine     Social History   Tobacco Use  . Smoking status: Former Smoker    Packs/day: 1.00    Types: Cigarettes    Quit date: 03/03/2015    Years since quitting: 4.6  . Smokeless tobacco: Never Used  . Tobacco comment: Quit May 8  Substance Use Topics  . Alcohol use: No    Alcohol/week: 0.0 standard drinks    Current Outpatient Medications:  .  acyclovir (ZOVIRAX) 200 MG capsule, TAKE 2 CAPSULES BY MOUTH 2 TO 3 TIMES DAILY FOR 5 DAYS AS NEEDED, Disp: 60 capsule, Rfl: 0 .  albuterol (VENTOLIN HFA) 108 (90 Base) MCG/ACT inhaler,  Inhale 2 puffs into the lungs every 4 (four) hours as needed for wheezing or shortness of breath., Disp: 8 g, Rfl: 1 .  azithromycin (ZITHROMAX) 250 MG tablet, Take 2 tabs PO x 1 dose, then 1 tab PO QD x 4 days (Patient not taking: Reported on 10/31/2019), Disp: 6 tablet, Rfl: 0 .  pantoprazole (PROTONIX) 40 MG tablet, Take 1 tablet (40 mg total) by mouth daily., Disp: 30 tablet, Rfl: 11 .  Plecanatide (TRULANCE) 3 MG TABS, Take 3 mg by mouth daily.,  Disp: 30 tablet, Rfl: 1 .  promethazine-codeine (PHENERGAN WITH CODEINE) 6.25-10 MG/5ML syrup, Take 5 mLs by mouth every 8 (eight) hours as needed for cough., Disp: 120 mL, Rfl: 0 .  SUMAtriptan (IMITREX) 50 MG tablet, Take 1 tablet (50 mg total) by mouth every 2 (two) hours as needed for migraine., Disp: 10 tablet, Rfl: 3  Allergies  Allergen Reactions  . Amoxicillin-Pot Clavulanate Nausea And Vomiting and Other (See Comments)    Has patient had a PCN reaction causing immediate rash, facial/tongue/throat swelling, SOB or lightheadedness with hypotension: No Has patient had a PCN reaction causing severe rash involving mucus membranes or skin necrosis: No Has patient had a PCN reaction that required hospitalization No Has patient had a PCN reaction occurring within the last 10 years: No If all of the above answers are "NO", then may proceed with Cephalosporin use. Other reaction(s): Vomiting  . Codeine Nausea And Vomiting  . Latex Rash  . Aspirin Nausea And Vomiting  . Neosporin [Neomycin-Bacitracin Zn-Polymyx]     Objective:  There were no vitals taken for this visit.  VITALS: Per patient if applicable, see vitals. GENERAL: Alert, appears well and in no acute distress. HEENT: Atraumatic, conjunctiva clear, no obvious abnormalities on inspection of external nose and ears. NECK: Normal movements of the head and neck. CARDIOPULMONARY: No increased WOB. Speaking in clear sentences. I:E ratio WNL.  MS: Moves all visible extremities without noticeable abnormality. PSYCH: Pleasant and cooperative, well-groomed. Speech normal rate and rhythm. Affect is appropriate. Insight and judgement are appropriate. Attention is focused, linear, and appropriate.  NEURO: CN grossly intact. Oriented as arrived to appointment on time with no prompting. Moves both UE equally.  SKIN: No obvious lesions, wounds, erythema, or cyanosis noted on face or hands.  Depression screen [facility] 2/9 07/22/2018 04/06/2018    Decreased Interest 0 0  Down, Depressed, Hopeless 1 1  PHQ - 2 Score 1 1  Altered sleeping 3 3  Tired, decreased energy 2 3  Change in appetite 2 3  Feeling bad or failure about yourself  0 1  Trouble concentrating 1 1  Moving slowly or fidgety/restless 2 0  Suicidal thoughts 0 0  PHQ-9 Score 11 12  Difficult doing work/chores - Very difficult     . COVID-19 Education: The signs and symptoms of COVID-19 were discussed with the patient and how to seek care for testing if needed. The importance of social distancing was discussed today. . Reviewed expectations re: course of current medical issues. . Discussed self-management of symptoms. . Outlined signs and symptoms indicating need for more acute intervention. . Patient verbalized understanding and all questions were answered. [name] Health Maintenance issues including appropriate healthy diet, exercise, and smoking avoidance were discussed with patient. . See orders for this visit as documented in the electronic medical record.  Arnette Norris, MD  Records requested if needed. Time spent: *** minutes, of which >50% was spent in obtaining information about her symptoms, reviewing  her previous labs, evaluations, and treatments, counseling her about her condition (please see the discussed topics above), and developing a plan to further investigate it; she had a number of questions which I addressed.   Lab Results  Component Value Date   WBC 5.8 08/09/2018   HGB 13.1 08/09/2018   HCT 38.2 08/09/2018   PLT 283.0 08/09/2018   GLUCOSE 105 (H) 08/09/2018   CHOL 215 (H) 07/07/2018   TRIG 196.0 (H) 07/07/2018   HDL 43.90 07/07/2018   LDLDIRECT 132.0 05/10/2018   LDLCALC 132 (H) 07/07/2018   ALT 12 08/09/2018   AST 12 08/09/2018   NA 140 08/09/2018   K 4.0 08/09/2018   CL 105 08/09/2018   CREATININE 0.67 08/09/2018   BUN 14 08/09/2018   CO2 27 08/09/2018   TSH 2.85 08/09/2018    Lab Results  Component Value Date   TSH 2.85 08/09/2018    Lab Results  Component Value Date   WBC 5.8 08/09/2018   HGB 13.1 08/09/2018   HCT 38.2 08/09/2018   MCV 92.7 08/09/2018   PLT 283.0 08/09/2018   Lab Results  Component Value Date   NA 140 08/09/2018   K 4.0 08/09/2018   CO2 27 08/09/2018   GLUCOSE 105 (H) 08/09/2018   BUN 14 08/09/2018   CREATININE 0.67 08/09/2018   BILITOT 0.4 08/09/2018   ALKPHOS 104 08/09/2018   AST 12 08/09/2018   ALT 12 08/09/2018   PROT 7.6 08/09/2018   ALBUMIN 4.4 08/09/2018   CALCIUM 9.4 08/09/2018   ANIONGAP 13 11/26/2015   GFR 97.22 08/09/2018   Lab Results  Component Value Date   CHOL 215 (H) 07/07/2018   Lab Results  Component Value Date   HDL 43.90 07/07/2018   Lab Results  Component Value Date   LDLCALC 132 (H) 07/07/2018   Lab Results  Component Value Date   TRIG 196.0 (H) 07/07/2018   Lab Results  Component Value Date   CHOLHDL 5 07/07/2018   No results found for: HGBA1C     Assessment & Plan:   Problem List Items Addressed This Visit    None      I am having Danielle Strickland maintain her SUMAtriptan, pantoprazole, acyclovir, Trulance, promethazine-codeine, azithromycin, and albuterol.  No orders of the defined types were placed in this encounter.    Arnette Norris, MD

## 2019-11-02 ENCOUNTER — Ambulatory Visit: Payer: BC Managed Care – PPO | Admitting: Family Medicine

## 2019-11-03 ENCOUNTER — Encounter: Payer: Self-pay | Admitting: Intensive Care

## 2019-11-03 ENCOUNTER — Other Ambulatory Visit: Payer: Self-pay

## 2019-11-03 ENCOUNTER — Emergency Department
Admission: EM | Admit: 2019-11-03 | Discharge: 2019-11-03 | Disposition: A | Discharge status: Home / Self Care | Payer: BC Managed Care – PPO | Attending: Student in an Organized Health Care Education/Training Program | Admitting: Student in an Organized Health Care Education/Training Program

## 2019-11-03 DIAGNOSIS — Z87891 Personal history of nicotine dependence: Secondary | ICD-10-CM | POA: Diagnosis not present

## 2019-11-03 DIAGNOSIS — Z85828 Personal history of other malignant neoplasm of skin: Secondary | ICD-10-CM | POA: Diagnosis not present

## 2019-11-03 DIAGNOSIS — R5383 Other fatigue: Secondary | ICD-10-CM | POA: Diagnosis not present

## 2019-11-03 DIAGNOSIS — U071 COVID-19: Secondary | ICD-10-CM | POA: Diagnosis not present

## 2019-11-03 LAB — URINALYSIS, COMPLETE (UACMP) WITH MICROSCOPIC
Bacteria, UA: NONE SEEN
Bilirubin Urine: NEGATIVE
Glucose, UA: NEGATIVE mg/dL
Ketones, ur: NEGATIVE mg/dL
Leukocytes,Ua: NEGATIVE
Nitrite: NEGATIVE
Protein, ur: NEGATIVE mg/dL
Specific Gravity, Urine: 1.027 (ref 1.005–1.030)
pH: 5 (ref 5.0–8.0)

## 2019-11-03 LAB — COMPREHENSIVE METABOLIC PANEL
ALT: 35 U/L (ref 0–44)
AST: 35 U/L (ref 15–41)
Albumin: 3.7 g/dL (ref 3.5–5.0)
Alkaline Phosphatase: 96 U/L (ref 38–126)
Anion gap: 11 (ref 5–15)
BUN: 9 mg/dL (ref 6–20)
CO2: 23 mmol/L (ref 22–32)
Calcium: 8.5 mg/dL — ABNORMAL LOW (ref 8.9–10.3)
Chloride: 101 mmol/L (ref 98–111)
Creatinine, Ser: 0.67 mg/dL (ref 0.44–1.00)
GFR calc Af Amer: >60 mL/min (ref >60)
GFR calc non Af Amer: >60 mL/min (ref >60)
Glucose, Bld: 110 mg/dL — ABNORMAL HIGH (ref 70–99)
Potassium: 3.2 mmol/L — ABNORMAL LOW (ref 3.5–5.1)
Sodium: 135 mmol/L (ref 135–145)
Total Bilirubin: 0.6 mg/dL (ref 0.3–1.2)
Total Protein: 8.1 g/dL (ref 6.5–8.1)

## 2019-11-03 LAB — CBC
HCT: 35.4 % — ABNORMAL LOW (ref 36.0–46.0)
Hemoglobin: 12.3 g/dL (ref 12.0–15.0)
MCH: 31.5 pg (ref 26.0–34.0)
MCHC: 34.7 g/dL (ref 30.0–36.0)
MCV: 90.8 fL (ref 80.0–100.0)
Platelets: 298 10*3/uL (ref 150–400)
RBC: 3.9 MIL/uL (ref 3.87–5.11)
RDW: 12.6 % (ref 11.5–15.5)
WBC: 5.1 10*3/uL (ref 4.0–10.5)
nRBC: 0 % (ref 0.0–0.2)

## 2019-11-03 LAB — LIPASE, BLOOD: Lipase: 43 U/L (ref 11–51)

## 2019-11-03 MED ORDER — PREDNISONE 20 MG PO TABS
60.0000 mg | ORAL_TABLET | Freq: Once | ORAL | Status: AC
  Administered 2019-11-03: 60 mg via ORAL
  Filled 2019-11-03: qty 3

## 2019-11-03 MED ORDER — ONDANSETRON 4 MG PO TBDP
4.0000 mg | ORAL_TABLET | Freq: Once | ORAL | Status: AC | PRN
  Administered 2019-11-03: 4 mg via ORAL
  Filled 2019-11-03: qty 1

## 2019-11-03 MED ORDER — PREDNISONE 20 MG PO TABS: ORAL_TABLET | ORAL | 0 refills | Status: DC

## 2019-11-03 MED ORDER — METOCLOPRAMIDE HCL 5 MG PO TABS: 5.0000 mg | ORAL_TABLET | Freq: Three times a day (TID) | ORAL | 0 refills | Status: DC | PRN

## 2019-11-03 NOTE — Discharge Instructions (Signed)
You are experiencing symptoms consistent with your COVID diagnosis. Continue to monitor and treat symptoms with the prescription meds. Rest, hydrate, and eat small meals. Consider carb-rich foods like rice, pasta, and potatoes. Follow-up with your provider for ongoing symptoms. Return as needed.

## 2019-11-03 NOTE — ED Notes (Signed)
Pt states that she was diagnosed with COVID 12/30. Pt states that she started having sx's on 12/26. Pt reports that her symptoms are not any better. Pt states that she is extremely fatigue, nausea, diarrhea, and not able to eat. Pt still c/o headache. Pt does not have fever or body aches.   Pt states that nurse at [facility] department wanted her to be seen for possible dehydration.

## 2019-11-03 NOTE — ED Provider Notes (Signed)
[facility] Emergency Department Provider Note ____________________________________________  Time seen: 1615  I have reviewed the triage vital signs and the nursing notes.  HISTORY  Chief Complaint  Fatigue, Diarrhea, and Headache  HPI Danielle Strickland is a 54 y.o. female presents with self to the ED for ongoing symptoms related to her previous Covid diagnosis.  Patient reports she was diagnosed on 12/30.  She is continuing to experience fatigue, nausea, diarrhea, loss of taste/smell sensation, and intermittent headache.  She has been treated with azithromycin, Phenergan with codeine, and an albuterol inhaler. She is denying any recent fevers, cough, or chest pain.   Past Medical History:  Diagnosis Date  . Abnormal Pap smear of vagina and vaginal HPV    ascus   . Allergy   . Endometriosis   . GERD (gastroesophageal reflux disease)   . 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 History of migraine   . HPV in female   . IBS (irritable bowel syndrome)   . Liver disease    H/O hepatitis  . Mental disorder    anxiety  . Microscopic hematuria   . Migraines   . Peptic ulcer disease   . Skin cancer    left thigh  . Tobacco abuse 02/21/2013    Patient Active Problem List   Diagnosis Date Noted  . COVID-19 virus infection 10/30/2019  . SOB (shortness of breath) 08/09/2018  . Palpitation 07/22/2018  . HLD (hyperlipidemia) 05/10/2018  . Elevated TSH 05/10/2018  . Restless legs syndrome (RLS) 04/06/2018  . Anxiety 02/01/2018  . Gastroesophageal reflux disease 02/01/2018  . Inflammatory disease of liver 02/01/2018  . Menopausal syndrome 02/01/2018  . Migraine 02/01/2018  . Recurrent hematuria 02/01/2018  . Complex regional pain syndrome type 1 affecting right lower leg 09/03/2016  . PTSD (post-traumatic stress disorder) 03/02/2016  . Malleolar fracture 12/10/2015  . Hx MRSA infection 06/18/2015  . Abnormal chest x-ray 05/29/2015  .  Family history of colon cancer 02/21/2013  . TMJ (dislocation of temporomandibular joint) 02/21/2013  . Allergy   . Skin cancer   . Peptic ulcer disease   . Generalized anxiety disorder   . Herpes simplex without mention of complication   . IBS (irritable bowel syndrome)   . History of migraine     Past Surgical History:  Procedure Laterality Date  . ABDOMINAL HYSTERECTOMY    . APPENDECTOMY    . CHOLECYSTECTOMY N/A 11/26/2015   Procedure: LAPAROSCOPIC CHOLECYSTECTOMY ;  Surgeon: Jules Husbands, MD;  Location: [facility] ORS;  Service: General;  Laterality: N/A;  . laparoscopically assisted vaginal hysterectomy  2006  . SALIVARY GLAND SURGERY    . submandular    . TUBAL LIGATION    . WISDOM TOOTH EXTRACTION      Prior to Admission medications   Medication Sig Start Date End Date Taking? Authorizing Provider  acyclovir (ZOVIRAX) 200 MG capsule TAKE 2 CAPSULES BY MOUTH 2 TO 3 TIMES DAILY FOR 5 DAYS AS NEEDED 09/28/19   Lucille Passy, MD  albuterol (VENTOLIN HFA) 108 (90 Base) MCG/ACT inhaler Inhale 2 puffs into the lungs every 4 (four) hours as needed for wheezing or shortness of breath. 10/25/19   Scot Jun, FNP  azithromycin (ZITHROMAX) 250 MG tablet Take 2 tabs PO x 1 dose, then 1 tab PO QD x 4 days Patient not taking: Reported on 10/31/2019 10/23/19   Scot Jun, FNP  metoCLOPramide (REGLAN) 5 MG tablet Take 1  tablet (5 mg total) by mouth every 8 (eight) hours as needed for up to 5 days for nausea or vomiting. 11/03/19 11/08/19  Tasharra Nodine, Dannielle Karvonen, PA-C  pantoprazole (PROTONIX) 40 MG tablet Take 1 tablet (40 mg total) by mouth daily. 06/27/19   Lucilla Lame, MD  Plecanatide (TRULANCE) 3 MG TABS Take 3 mg by mouth daily. 10/17/19   Lucilla Lame, MD  predniSONE (DELTASONE) 20 MG tablet Take 3 tabs daily x 2 days; Take 2 tabs daily x 3 days; Take 1 tabs daily x 3 days; Take 0.5 tabs daily x 4 days. 11/03/19   Oriya Kettering, Dannielle Karvonen, PA-C  promethazine-codeine (PHENERGAN WITH  CODEINE) 6.25-10 MG/5ML syrup Take 5 mLs by mouth every 8 (eight) hours as needed for cough. 10/23/19   Scot Jun, FNP  SUMAtriptan (IMITREX) 50 MG tablet Take 1 tablet (50 mg total) by mouth every 2 (two) hours as needed for migraine. 04/06/18   Lucille Passy, MD    Allergies Amoxicillin-pot clavulanate, Codeine, Latex, Aspirin, and Neosporin [neomycin-bacitracin zn-polymyx]  Family History  Problem Relation Age of Onset  . Cancer Mother        colon   . Breast cancer Maternal Grandmother   . Thyroid cancer Maternal Aunt   . Breast cancer Maternal Aunt   . Cancer Paternal Aunt        colon    Social History Social History   Tobacco Use  . Smoking status: Former Smoker    Packs/day: 1.00    Types: Cigarettes    Quit date: 03/03/2015    Years since quitting: 4.6  . Smokeless tobacco: Never Used  . Tobacco comment: Quit May 8  Substance Use Topics  . Alcohol use: No    Alcohol/week: 0.0 standard drinks  . Drug use: No    Review of Systems  Constitutional: Negative for fever. Reports fatigue and malaise.  Eyes: Negative for visual changes. ENT: Negative for sore throat. Cardiovascular: Negative for chest pain. Respiratory: Positive for shortness of breath. Gastrointestinal: Negative for abdominal pain. Reports vomiting and diarrhea. Genitourinary: Negative for dysuria. Musculoskeletal: Negative for back pain. Skin: Negative for rash. Neurological: Negative for headaches, focal weakness or numbness. ____________________________________________  PHYSICAL EXAM:  VITAL SIGNS: ED Triage Vitals  Enc Vitals Group     BP 11/03/19 1521 (!) 121/53     Pulse Rate 11/03/19 1521 (!) 104     Resp 11/03/19 1521 16     Temp 11/03/19 1521 99 F (37.2 C)     Temp Source 11/03/19 1521 Oral     SpO2 11/03/19 1521 96 %     Weight 11/03/19 1522 171 lb (77.6 kg)     Height 11/03/19 1522 5\' 4"  (1.626 m)     Head Circumference --      Peak Flow --      Pain Score 11/03/19  1522 0     Pain Loc --      Pain Edu? --      Excl. in [place]? --     Constitutional: Alert and oriented. Well appearing and in no distress. Head: Normocephalic and atraumatic. Eyes: Conjunctivae are normal. PERRL. Normal extraocular movements Cardiovascular: Normal rate, regular rhythm. Normal distal pulses. Respiratory: Normal respiratory effort. No wheezes/rales/rhonchi. Gastrointestinal: Soft and nontender. No distention. Musculoskeletal: Nontender with normal range of motion in all extremities.  Neurologic:  Normal gait without ataxia. Normal speech and language. No gross focal neurologic deficits are appreciated. Skin:  Skin is warm, dry  and intact. No rash noted. ____________________________________________   LABS (pertinent positives/negatives) Labs Reviewed  COMPREHENSIVE METABOLIC PANEL - Abnormal; Notable for the following components:      Result Value   Potassium 3.2 (*)    Glucose, Bld 110 (*)    Calcium 8.5 (*)    All other components within normal limits  CBC - Abnormal; Notable for the following components:   HCT 35.4 (*)    All other components within normal limits  URINALYSIS, COMPLETE (UACMP) WITH MICROSCOPIC - Abnormal; Notable for the following components:   Color, Urine AMBER (*)    APPearance HAZY (*)    Hgb urine dipstick SMALL (*)    All other components within normal limits  LIPASE, BLOOD  ____________________________________________  PROCEDURES  Prednisone 60 mg PO Procedures ____________________________________________  INITIAL IMPRESSION / ASSESSMENT AND PLAN / ED COURSE  Patient presents to the ED for ongoing symptoms related to her recent Covid diagnosis.  She continues to experience fatigue, malaise, nausea and anorexia.  Her exam is overall benign reassuring at this time.  No signs of acute infectious process, dehydration, or septic appearance.  Patient is without any signs of respiratory distress.  She is encouraged to continue to push fluids  to prevent dehydration.  She will be discharged at this time with a prescription for Reglan as well as a prednisone taper.  She is encouraged to use her inhaler regularly for shortness of breath.  She will be quarantine at work for the remainder of the week.  She will follow-up with [facility] for final disposition.  Return precautions have been discussed and the patient verbalizes understanding.  She is discharged at this time to follow-up as discussed.  Danielle Strickland was evaluated in Emergency Department on 11/03/2019 for the symptoms described in the history of present illness. She was evaluated in the context of the global COVID-19 pandemic, which necessitated consideration that the patient might be at risk for infection with the SARS-CoV-2 virus that causes COVID-19. Institutional protocols and algorithms that pertain to the evaluation of patients at risk for COVID-19 are in a state of rapid change based on information released by regulatory bodies including the CDC and federal and state organizations. These policies and algorithms were followed during the patient's care in the ED. ____________________________________________  FINAL CLINICAL IMPRESSION(S) / ED DIAGNOSES  Final diagnoses:  [id number] virus detected      Carmie End, Dannielle Karvonen, PA-C 11/03/19 1903    Merlyn Lot, MD 11/03/19 1921

## 2019-11-03 NOTE — ED Triage Notes (Addendum)
Diagnosed COVID + 10/25/19. Reports still feeling fatigue, nausea, loss of appetite, and diarrhea. Patient crying in triage stating "I cannot get my strength back" Patient drove herself to ER. Reports she was told to come here for fluids

## 2019-11-07 ENCOUNTER — Telehealth: Payer: Self-pay | Admitting: Family Medicine

## 2019-11-07 NOTE — Telephone Encounter (Signed)
Patient is calling and is requesting to speak to Dr. Deborra Medina regarding her returning back to work and being released since her Covid exposure. CB [phone]

## 2019-11-08 NOTE — Telephone Encounter (Signed)
Please call pt- per cdc guidelines:  She is safe to return to work after-    . at least 10 days since symptoms onset . AND 3 days fever free without antipyretics (Tylenol or Ibuprofen) . AND improvement in respiratory symptoms.

## 2019-11-08 NOTE — Telephone Encounter (Signed)
Patient checking on message left yesterday. Patient stated she hasn't received a call back.

## 2019-11-09 ENCOUNTER — Telehealth: Payer: Self-pay

## 2019-11-09 NOTE — Telephone Encounter (Signed)
Not for COVID for work ok per TA/thx dmf

## 2019-11-15 ENCOUNTER — Encounter: Payer: Self-pay | Admitting: Family Medicine

## 2019-11-15 ENCOUNTER — Telehealth: Payer: Self-pay | Admitting: Family Medicine

## 2019-11-15 NOTE — Telephone Encounter (Signed)
Scheduled pt for VV on Monday/thx dmf

## 2019-11-15 NOTE — Telephone Encounter (Signed)
Patient is calling to give an update on Covid and symptoms. CB is [phone]

## 2019-11-17 NOTE — Progress Notes (Signed)
TELEPHONE ENCOUNTER   Patient verbally agreed to telephone visit and is aware that copayment and coinsurance may apply. Patient was treated using telemedicine according to accepted telemedicine protocols.  Location of the patient: Patient's home           Location of provider: Provider''s home  Names of all persons participating in the telemedicine service and role in the encounter: Arnette Norris, MD Rupert Stacks   Subjective:   Chief Complaint  Patient presents with  . Shortness of Breath     HPI   Was diagnosed with covid almost one month ago.  Since she finished course of steroids last Thursday, every fatigued.  She has as very active job- walks 2 flights of stairs at work.  She is very SOB and fatigued.  Patient Active Problem List   Diagnosis Date Noted  . Encounter for completion of form with patient 11/20/2019  . COVID-19 virus infection 10/30/2019  . SOB (shortness of breath) 08/09/2018  . Palpitation 07/22/2018  . HLD (hyperlipidemia) 05/10/2018  . Elevated TSH 05/10/2018  . Restless legs syndrome (RLS) 04/06/2018  . Anxiety 02/01/2018  . Gastroesophageal reflux disease 02/01/2018  . Inflammatory disease of liver 02/01/2018  . Menopausal syndrome 02/01/2018  . Migraine 02/01/2018  . Recurrent hematuria 02/01/2018  . Complex regional pain syndrome type 1 affecting right lower leg 09/03/2016  . PTSD (post-traumatic stress disorder) 03/02/2016  . Malleolar fracture 12/10/2015  . Hx MRSA infection 06/18/2015  . Abnormal chest x-ray 05/29/2015  . Family history of colon cancer 02/21/2013  . TMJ (dislocation of temporomandibular joint) 02/21/2013  . Allergy   . Skin cancer   . Peptic ulcer disease   . Generalized anxiety disorder   . Herpes simplex without mention of complication   . IBS (irritable bowel syndrome)   . History of migraine    Social History   Tobacco Use  . Smoking status: Former Smoker    Packs/day: 1.00    Types: Cigarettes    Quit  date: 03/03/2015    Years since quitting: 4.7  . Smokeless tobacco: Never Used  . Tobacco comment: Quit May 8  Substance Use Topics  . Alcohol use: No    Alcohol/week: 0.0 standard drinks    Current Outpatient Medications:  .  acyclovir (ZOVIRAX) 200 MG capsule, TAKE 2 CAPSULES BY MOUTH 2 TO 3 TIMES DAILY FOR 5 DAYS AS NEEDED, Disp: 60 capsule, Rfl: 0 .  albuterol (VENTOLIN HFA) 108 (90 Base) MCG/ACT inhaler, Inhale 2 puffs into the lungs every 4 (four) hours as needed for wheezing or shortness of breath., Disp: 8 g, Rfl: 1 .  pantoprazole (PROTONIX) 40 MG tablet, Take 1 tablet (40 mg total) by mouth daily., Disp: 30 tablet, Rfl: 11 .  Plecanatide (TRULANCE) 3 MG TABS, Take 3 mg by mouth daily., Disp: 30 tablet, Rfl: 1 .  SUMAtriptan (IMITREX) 50 MG tablet, Take 1 tablet (50 mg total) by mouth every 2 (two) hours as needed for migraine., Disp: 10 tablet, Rfl: 3 .  metoCLOPramide (REGLAN) 5 MG tablet, Take 1 tablet (5 mg total) by mouth every 8 (eight) hours as needed for up to 5 days for nausea or vomiting., Disp: 15 tablet, Rfl: 0 Allergies  Allergen Reactions  . Amoxicillin-Pot Clavulanate Nausea And Vomiting and Other (See Comments)    Has patient had a PCN reaction causing immediate rash, facial/tongue/throat swelling, SOB or lightheadedness with hypotension: No Has patient had a PCN reaction causing severe rash involving mucus membranes or  skin necrosis: No Has patient had a PCN reaction that required hospitalization No Has patient had a PCN reaction occurring within the last 10 years: No If all of the above answers are "NO", then may proceed with Cephalosporin use. Other reaction(s): Vomiting  . Codeine Nausea And Vomiting  . Latex Rash  . Aspirin Nausea And Vomiting  . Neosporin [Neomycin-Bacitracin Zn-Polymyx]       Arnette Norris, MD 11/20/2019  Time spent with the patient: 22 minutes, spent in obtaining information about her symptoms, reviewing her previous labs, evaluations,  and treatments, counseling her about her condition (please see the discussed topics above), and developing a plan to further investigate it; she had a number of questions which I addressed.   [phone] physician/qualified health professional telephone evaluation 5 to 10 minutes [phone] physician/qualified help functional [place] evaluation for 11 to 20 minutes [phone] physician/qualify he will professional telephone evaluation for 21 to 30 minutes   Lab Results  Component Value Date   WBC 5.1 11/03/2019   HGB 12.3 11/03/2019   HCT 35.4 (L) 11/03/2019   PLT 298 11/03/2019   GLUCOSE 110 (H) 11/03/2019   CHOL 215 (H) 07/07/2018   TRIG 196.0 (H) 07/07/2018   HDL 43.90 07/07/2018   LDLDIRECT 132.0 05/10/2018   LDLCALC 132 (H) 07/07/2018   ALT 35 11/03/2019   AST 35 11/03/2019   NA 135 11/03/2019   K 3.2 (L) 11/03/2019   CL 101 11/03/2019   CREATININE 0.67 11/03/2019   BUN 9 11/03/2019   CO2 23 11/03/2019   TSH 2.85 08/09/2018    Lab Results  Component Value Date   TSH 2.85 08/09/2018   Lab Results  Component Value Date   WBC 5.1 11/03/2019   HGB 12.3 11/03/2019   HCT 35.4 (L) 11/03/2019   MCV 90.8 11/03/2019   PLT 298 11/03/2019   Lab Results  Component Value Date   NA 135 11/03/2019   K 3.2 (L) 11/03/2019   CO2 23 11/03/2019   GLUCOSE 110 (H) 11/03/2019   BUN 9 11/03/2019   CREATININE 0.67 11/03/2019   BILITOT 0.6 11/03/2019   ALKPHOS 96 11/03/2019   AST 35 11/03/2019   ALT 35 11/03/2019   PROT 8.1 11/03/2019   ALBUMIN 3.7 11/03/2019   CALCIUM 8.5 (L) 11/03/2019   ANIONGAP 11 11/03/2019   GFR 97.22 08/09/2018   Lab Results  Component Value Date   CHOL 215 (H) 07/07/2018   Lab Results  Component Value Date   HDL 43.90 07/07/2018   Lab Results  Component Value Date   LDLCALC 132 (H) 07/07/2018   Lab Results  Component Value Date   TRIG 196.0 (H) 07/07/2018   Lab Results  Component Value Date   CHOLHDL 5 07/07/2018   No results found for: HGBA1C       Assessment & Plan:   Problem List Items Addressed This Visit      Active Problems   SOB (shortness of breath)    Patient is connecting to F/U after hospital visit at [facility] on 1.8.21 for positive COVID.  She has been out of work and feels that she needs either a longer time out or a modified work schedule in order to return to work.  She gets winded with conversations and she does not feel that she will be able to hit the ground running for 12-hours-daily like she had been.       Relevant Orders   Temperature monitoring   COVID-19 virus infection - Primary  Relevant Orders   Temperature monitoring   Encounter for completion of form with patient    Discussed symptoms with pt- she will return to work part time (for one month), due to physical job and persistent fatigue and SOB.  She will continue to update [place].  I have ordered covid home monitoring system.         I have discontinued Marialuisa P. Mccreadie's promethazine-codeine, azithromycin, and predniSONE. I am also having her maintain her SUMAtriptan, pantoprazole, acyclovir, Trulance, albuterol, and metoCLOPramide.  No orders of the defined types were placed in this encounter.    Arnette Norris, MD

## 2019-11-19 NOTE — Assessment & Plan Note (Signed)
Patient is connecting to F/U after hospital visit at [facility] on 1.8.21 for positive COVID.  She has been out of work and feels that she needs either a longer time out or a modified work schedule in order to return to work.  She gets winded with conversations and she does not feel that she will be able to hit the ground running for 12-hours-daily like she had been.

## 2019-11-20 ENCOUNTER — Ambulatory Visit (INDEPENDENT_AMBULATORY_CARE_PROVIDER_SITE_OTHER): Payer: BC Managed Care – PPO | Admitting: Family Medicine

## 2019-11-20 ENCOUNTER — Encounter: Payer: Self-pay | Admitting: Family Medicine

## 2019-11-20 ENCOUNTER — Telehealth: Payer: Self-pay

## 2019-11-20 DIAGNOSIS — Z0000 Encounter for general adult medical examination without abnormal findings: Secondary | ICD-10-CM | POA: Insufficient documentation

## 2019-11-20 DIAGNOSIS — U071 COVID-19: Secondary | ICD-10-CM

## 2019-11-20 DIAGNOSIS — Z0289 Encounter for other administrative examinations: Secondary | ICD-10-CM | POA: Insufficient documentation

## 2019-11-20 DIAGNOSIS — R0602 Shortness of breath: Secondary | ICD-10-CM

## 2019-11-20 MED ORDER — ACYCLOVIR 200 MG PO CAPS: ORAL_CAPSULE | ORAL | 6 refills | Status: DC

## 2019-11-20 NOTE — Assessment & Plan Note (Signed)
Discussed symptoms with pt- she will return to work part time (for one month), due to physical job and persistent fatigue and SOB.  She will continue to update [place].  I have ordered covid home monitoring system.

## 2019-11-20 NOTE — Telephone Encounter (Signed)
Pt needed a new letter/per TA ok to rewrite/pt aware/thx dmf

## 2019-11-21 ENCOUNTER — Encounter (INDEPENDENT_AMBULATORY_CARE_PROVIDER_SITE_OTHER): Payer: Self-pay

## 2019-11-22 ENCOUNTER — Encounter (INDEPENDENT_AMBULATORY_CARE_PROVIDER_SITE_OTHER): Payer: Self-pay

## 2019-11-22 ENCOUNTER — Encounter: Payer: Self-pay | Admitting: Family Medicine

## 2019-11-23 ENCOUNTER — Other Ambulatory Visit: Payer: Self-pay

## 2019-11-23 ENCOUNTER — Encounter (INDEPENDENT_AMBULATORY_CARE_PROVIDER_SITE_OTHER): Payer: Self-pay

## 2019-11-23 MED ORDER — PROMETHAZINE HCL 25 MG PO TABS: ORAL_TABLET | ORAL | 11 refills | Status: DC

## 2019-11-23 MED ORDER — SUMATRIPTAN SUCCINATE 50 MG PO TABS: 50.0000 mg | ORAL_TABLET | ORAL | 11 refills | Status: DC | PRN

## 2019-11-23 NOTE — Progress Notes (Signed)
Sent in imitrex and phenergan per TA/thx dmf

## 2019-11-25 ENCOUNTER — Encounter (INDEPENDENT_AMBULATORY_CARE_PROVIDER_SITE_OTHER): Payer: Self-pay

## 2019-11-26 ENCOUNTER — Encounter (INDEPENDENT_AMBULATORY_CARE_PROVIDER_SITE_OTHER): Payer: Self-pay

## 2019-11-27 ENCOUNTER — Encounter (INDEPENDENT_AMBULATORY_CARE_PROVIDER_SITE_OTHER): Payer: Self-pay

## 2019-11-28 ENCOUNTER — Encounter (INDEPENDENT_AMBULATORY_CARE_PROVIDER_SITE_OTHER): Payer: Self-pay

## 2019-11-30 ENCOUNTER — Encounter: Payer: Self-pay | Admitting: Family Medicine

## 2019-11-30 ENCOUNTER — Encounter (INDEPENDENT_AMBULATORY_CARE_PROVIDER_SITE_OTHER): Payer: Self-pay

## 2019-11-30 ENCOUNTER — Telehealth: Payer: Self-pay | Admitting: *Deleted

## 2019-11-30 NOTE — Telephone Encounter (Signed)
Called patient in response to her covid questionnaire. She answered to worsening weakness/ fatigue. She stated she went back to work this week and only working half days. But she is just so tired when she gets home. She sits at her desk and does nothing strenuous.  She has been going to bed between 7 and 8 pm but tonight she is in bed at 6. She can not sleep. She still has the headache and throat still hurts at times. She just wanted her pcp to be aware of why she had answered more weakness than the day before. Asking for a call back with recommendations. She is advised that if she gets so weak, she can't get out of the bed, she would need to be seen in the ED. She voiced understanding.

## 2019-12-02 ENCOUNTER — Encounter (INDEPENDENT_AMBULATORY_CARE_PROVIDER_SITE_OTHER): Payer: Self-pay

## 2019-12-03 ENCOUNTER — Encounter (INDEPENDENT_AMBULATORY_CARE_PROVIDER_SITE_OTHER): Payer: Self-pay

## 2019-12-21 ENCOUNTER — Telehealth (INDEPENDENT_AMBULATORY_CARE_PROVIDER_SITE_OTHER): Payer: BC Managed Care – PPO | Admitting: Family Medicine

## 2019-12-21 DIAGNOSIS — F411 Generalized anxiety disorder: Secondary | ICD-10-CM | POA: Diagnosis not present

## 2019-12-21 DIAGNOSIS — Z8616 Personal history of COVID-19: Secondary | ICD-10-CM | POA: Diagnosis not present

## 2019-12-21 MED ORDER — SERTRALINE HCL 50 MG PO TABS: ORAL_TABLET | ORAL | 3 refills | Status: DC

## 2019-12-21 NOTE — Progress Notes (Signed)
Virtual Visit via Telephone Note  I connected with Danielle Strickland on 12/21/19 at  1:30 PM EST by telephone and verified that I am speaking with the correct person using two identifiers.   I discussed the limitations, risks, security and privacy concerns of performing an evaluation and management service by telephone and the availability of in person appointments. I also discussed with the patient that there may be a patient responsible charge related to this service. The patient expressed understanding and agreed to proceed.  Location patient: home Location provider: home office Participants present for the call: patient, provider Patient did not have a visit in the prior 7 days to address this/these issue(s).  Chief Complaint  Patient presents with  . Follow-up    Patient is connecting today to follow-up.  She was Dx with COVID on 12.28.20. 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 Deborra Medina to follow-up.     History of Present Illness: Danielle Strickland is a 54 y.o. female who is a previous patient of Dr. Deborra Medina who was was dx with COVID on 10/23/19. She had dealt with post-covid fatigue and was permitted to part part time in Feb 2020 and was then to f/u with PCP. Pt works at a [employer].  Pt does not feel she is 100% but does say her sleep is better. She is able to sleep 5-6hrs. She still endorses fatigue and feels exhausted easily. She has some "brain fog" but is doing ok at work.  She states she is not able to work part time any longer d/t financial reasons.   Pt states she anxious, frustrated, stressed with work. She is more irritable. She was previously on anxiety medication but has not been on this in a year. She was on zoloft and would like to restart.  Past Medical History:  Diagnosis Date  . Abnormal Pap smear of vagina and vaginal HPV    ascus   . Allergy   . Endometriosis   . GERD  (gastroesophageal reflux disease)   . 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 History of migraine   . HPV in female   . IBS (irritable bowel syndrome)   . Liver disease    H/O hepatitis  . Mental disorder    anxiety  . Microscopic hematuria   . Migraines   . Peptic ulcer disease   . Skin cancer    left thigh  . Tobacco abuse 02/21/2013    Past Surgical History:  Procedure Laterality Date  . ABDOMINAL HYSTERECTOMY    . APPENDECTOMY    . CHOLECYSTECTOMY N/A 11/26/2015   Procedure: LAPAROSCOPIC CHOLECYSTECTOMY ;  Surgeon: Jules Husbands, MD;  Location: [facility] ORS;  Service: General;  Laterality: N/A;  . laparoscopically assisted vaginal hysterectomy  2006  . SALIVARY GLAND SURGERY    . submandular    . TUBAL LIGATION    . WISDOM TOOTH EXTRACTION      Social History   Tobacco Use  . Smoking status: Former Smoker    Packs/day: 1.00    Types: Cigarettes    Quit date: 03/03/2015    Years since quitting: 4.8  . Smokeless tobacco: Never Used  . Tobacco comment: Quit May 8  Substance Use Topics  . Alcohol use: No    Alcohol/week: 0.0 standard drinks  . Drug use: No    Family History  Problem Relation Age  of Onset  . Cancer Mother        colon   . Breast cancer Maternal Grandmother   . Thyroid cancer Maternal Aunt   . Breast cancer Maternal Aunt   . Cancer Paternal Aunt        colon    Outpatient Encounter Medications as of 12/21/2019  Medication Sig  . acyclovir (ZOVIRAX) 200 MG capsule TAKE 2 CAPSULES BY MOUTH 2 TO 3 TIMES DAILY FOR 5 DAYS AS NEEDED  . albuterol (VENTOLIN HFA) 108 (90 Base) MCG/ACT inhaler Inhale 2 puffs into the lungs every 4 (four) hours as needed for wheezing or shortness of breath.  . metoCLOPramide (REGLAN) 5 MG tablet Take 1 tablet (5 mg total) by mouth every 8 (eight) hours as needed for up to 5 days for nausea or vomiting.  . pantoprazole (PROTONIX) 40 MG tablet Take 1 tablet (40 mg total) by mouth daily.  [name] Plecanatide  (TRULANCE) 3 MG TABS Take 3 mg by mouth daily.  . promethazine (PHENERGAN) 25 MG tablet Take 1q8h prn N&V with Migraines  . sertraline (ZOLOFT) 50 MG tablet 1/2 tab po daily x 1 week then 1 tab po daily  . SUMAtriptan (IMITREX) 50 MG tablet Take 1 tablet (50 mg total) by mouth every 2 (two) hours as needed for migraine.   No facility-administered encounter medications on file as of 12/21/2019.     Allergies  Allergen Reactions  . Amoxicillin-Pot Clavulanate Nausea And Vomiting and Other (See Comments)    Has patient had a PCN reaction causing immediate rash, facial/tongue/throat swelling, SOB or lightheadedness with hypotension: No Has patient had a PCN reaction causing severe rash involving mucus membranes or skin necrosis: No Has patient had a PCN reaction that required hospitalization No Has patient had a PCN reaction occurring within the last 10 years: No If all of the above answers are "NO", then may proceed with Cephalosporin use. Other reaction(s): Vomiting  . Codeine Nausea And Vomiting  . Latex Rash  . Aspirin Nausea And Vomiting  . Neosporin [Neomycin-Bacitracin Zn-Polymyx]     ROS: See pertinent positives and negatives per HPI.   Observations/Objective: Patient sounds anxious and tearful  on the phone. I do not appreciate any SOB. Speech and thought processing are grossly intact. Patient reported vitals:  There were no vitals taken for this visit.   Assessment and Plan:  1. History of COVID-19 - pt still with fatigue but overall improving - will write note that pt is able to return to work on 12/25/19 and adhere to [place]  2. Generalized anxiety disorder Restart: - sertraline (ZOLOFT) 50 MG tablet; 1/2 tab po daily x 1 week then 1 tab po daily  Dispense: 90 tablet; Refill: 3 - f/u in 4 wks or sooner PRN   I did not refer this patient for an OV in the next 24 hours for this/these issue(s).  I discussed the assessment and treatment plan with the patient.  The patient was provided an opportunity to ask questions and all were answered. The patient agreed with the plan and demonstrated an understanding of the instructions.   The patient was advised to call back or seek an in-person evaluation if the symptoms worsen or if the condition fails to improve as anticipated.  I provided 23 minutes of non-face-to-face time during this encounter.   Letta Median, DO

## 2019-12-22 ENCOUNTER — Ambulatory Visit: Payer: BC Managed Care – PPO | Admitting: Family Medicine

## 2019-12-25 ENCOUNTER — Other Ambulatory Visit: Payer: Self-pay

## 2019-12-25 ENCOUNTER — Telehealth (INDEPENDENT_AMBULATORY_CARE_PROVIDER_SITE_OTHER): Payer: BC Managed Care – PPO | Admitting: Nurse Practitioner

## 2019-12-25 ENCOUNTER — Encounter: Payer: Self-pay | Admitting: Nurse Practitioner

## 2019-12-25 VITALS — Ht 64.0 in

## 2019-12-25 DIAGNOSIS — N3000 Acute cystitis without hematuria: Secondary | ICD-10-CM | POA: Diagnosis not present

## 2019-12-25 MED ORDER — SULFAMETHOXAZOLE-TRIMETHOPRIM 800-160 MG PO TABS: 1.0000 | ORAL_TABLET | Freq: Two times a day (BID) | ORAL | 0 refills | Status: DC

## 2019-12-25 MED ORDER — PHENAZOPYRIDINE HCL 100 MG PO TABS: 100.0000 mg | ORAL_TABLET | Freq: Three times a day (TID) | ORAL | 0 refills | Status: DC | PRN

## 2019-12-25 NOTE — Patient Instructions (Signed)
Start bactrim BID x3days, take with food Maintain adequate oral hydration Use pyridium for urinary discomfort Go to lab on Thursday for urinalysis.

## 2019-12-25 NOTE — Progress Notes (Signed)
Virtual Visit via Video Note  I connected with@ on 12/25/19 at  9:45 AM EST by a video enabled telemedicine application and verified that I am speaking with the correct person using two identifiers.  Location: Patient:Home Provider: Office Participants: patient and provider  I discussed the limitations of evaluation and management by telemedicine and the availability of in person appointments. I also discussed with the patient that there may be a patient responsible charge related to this service. The patient expressed understanding and agreed to proceed.  CC:pt c/o lower abd pain--near bladder area,pain comes and goes--spasm--very painful with movements/going for 2 days/iburpofen/ FYI--hx of bladder infections--couldnt walk for 3 days.   History of Present Illness: Not sexually active, no vaginal symptoms. Last UTI was 4yrs ago  Urinary Tract Infection  This is a new problem. The current episode started in the past 7 days. The problem occurs every urination. The problem has been unchanged. The quality of the pain is described as aching. The pain is severe. She is not sexually active. There is no history of pyelonephritis. Associated symptoms include chills, frequency and urgency. Pertinent negatives include no discharge, flank pain, hematuria, hesitancy, nausea, possible pregnancy, sweats or vomiting. She has tried nothing for the symptoms.   Observations/Objective: Physical Exam  Constitutional: She is oriented to person, place, and time. No distress.  Pulmonary/Chest: Effort normal.  Neurological: She is alert and oriented to person, place, and time.   Assessment and Plan: Maryjoan was seen today for urinary tract infection.  Diagnoses and all orders for this visit:  Acute cystitis without hematuria -     sulfamethoxazole-trimethoprim (BACTRIM DS) 800-160 MG tablet; Take 1 tablet by mouth 2 (two) times daily. -     phenazopyridine (PYRIDIUM) 100 MG tablet; Take 1 tablet (100 mg  total) by mouth 3 (three) times daily as needed for pain (with food). -     Urinalysis w microscopic + reflex cultur; Future   Follow Up Instructions: See avs   I discussed the assessment and treatment plan with the patient. The patient was provided an opportunity to ask questions and all were answered. The patient agreed with the plan and demonstrated an understanding of the instructions.   The patient was advised to call back or seek an in-person evaluation if the symptoms worsen or if the condition fails to improve as anticipated.  Wilfred Lacy, NP

## 2019-12-28 ENCOUNTER — Other Ambulatory Visit: Payer: BC Managed Care – PPO

## 2019-12-28 ENCOUNTER — Other Ambulatory Visit: Payer: Self-pay

## 2019-12-28 DIAGNOSIS — N3000 Acute cystitis without hematuria: Secondary | ICD-10-CM | POA: Diagnosis not present

## 2019-12-29 ENCOUNTER — Telehealth: Payer: Self-pay | Admitting: Family Medicine

## 2019-12-29 ENCOUNTER — Encounter: Payer: Self-pay | Admitting: Nurse Practitioner

## 2019-12-29 NOTE — Telephone Encounter (Signed)
Patient is calling and stated that she received her labs result via my chart and wanted to see if someone could go over them with her. CB is [phone]

## 2019-12-29 NOTE — Telephone Encounter (Signed)
Danielle Strickland spoke with the pt an addressed her concerns.Pt verbally understood results.

## 2019-12-29 NOTE — Telephone Encounter (Signed)
Please see message and advise.  Thank you. ° °

## 2019-12-29 NOTE — Telephone Encounter (Signed)
Will await culture and sensitivity result to ensure bacteria not resistant to abx that pt was prescribed/took.

## 2019-12-29 NOTE — Telephone Encounter (Signed)
I spoke with pt and informed her Dr. Lurline Del message about test results.  Pt finished up her Rx yesterday.  Pt explained that she still feeing pressure and it still feels swollen but better then it was prior to taking medication.  I informed pt that I would send this message to provider and let them know of how she is feeling.

## 2019-12-29 NOTE — Telephone Encounter (Signed)
Pt saw Baldo Ash for UTI symptoms. UA shows likely UTI but culture is not yet resulted. She should continue taking abx as Rx'd.

## 2019-12-30 LAB — CULTURE INDICATED

## 2019-12-30 LAB — URINALYSIS W MICROSCOPIC + REFLEX CULTURE
Bacteria, UA: NONE SEEN /HPF
Bilirubin Urine: NEGATIVE
Glucose, UA: NEGATIVE
Hgb urine dipstick: NEGATIVE
Hyaline Cast: NONE SEEN /LPF
Ketones, ur: NEGATIVE
Nitrites, Initial: POSITIVE — AB
Protein, ur: NEGATIVE
Specific Gravity, Urine: 1.018 (ref 1.001–1.03)
Squamous Epithelial / HPF: NONE SEEN /HPF (ref <=5)
WBC, UA: NONE SEEN /HPF (ref 0–5)
pH: 7 (ref 5.0–8.0)

## 2019-12-30 LAB — URINE CULTURE
MICRO NUMBER:: 10219352
Result:: NO GROWTH
SPECIMEN QUALITY:: ADEQUATE

## 2020-01-24 DIAGNOSIS — H33103 Unspecified retinoschisis, bilateral: Secondary | ICD-10-CM | POA: Diagnosis not present

## 2020-01-24 DIAGNOSIS — H2513 Age-related nuclear cataract, bilateral: Secondary | ICD-10-CM | POA: Diagnosis not present

## 2020-01-24 DIAGNOSIS — H538 Other visual disturbances: Secondary | ICD-10-CM | POA: Diagnosis not present

## 2020-01-24 DIAGNOSIS — Z83511 Family history of glaucoma: Secondary | ICD-10-CM | POA: Diagnosis not present

## 2020-01-31 ENCOUNTER — Telehealth: Payer: Self-pay | Admitting: Family Medicine

## 2020-01-31 NOTE — Telephone Encounter (Signed)
error 

## 2020-02-09 ENCOUNTER — Telehealth (INDEPENDENT_AMBULATORY_CARE_PROVIDER_SITE_OTHER): Payer: BC Managed Care – PPO | Admitting: Family Medicine

## 2020-02-09 ENCOUNTER — Encounter: Payer: Self-pay | Admitting: Family Medicine

## 2020-02-09 VITALS — Ht 64.0 in | Wt 177.0 lb

## 2020-02-09 DIAGNOSIS — J019 Acute sinusitis, unspecified: Secondary | ICD-10-CM | POA: Diagnosis not present

## 2020-02-09 MED ORDER — AZITHROMYCIN 250 MG PO TABS: ORAL_TABLET | ORAL | 0 refills | Status: DC

## 2020-02-09 NOTE — Progress Notes (Signed)
Virtual Visit via Video Note  I connected with Danielle Strickland on 02/09/20 at 10:00 AM EDT by a video enabled telemedicine application and verified that I am speaking with the correct person using two identifiers. Location patient: home Location provider: work  Persons participating in the virtual visit: patient, provider  I discussed the limitations of evaluation and management by telemedicine and the availability of in person appointments. The patient expressed understanding and agreed to proceed.  Chief Complaint  Patient presents with  . Sinus Problem    Pt c/o sore throat, with cough and nasal drainage x 4days.     HPI: Danielle Strickland is a 54 y.o. female who is seen for the following issue/concern: 1. Cough, runny nose, nasal congestion, sore throat, hoarse voice, PND. No fever, chills. Pt states face and teeth hurt. Symptoms x 5-7 days.  She has Rx cough syrup from previous infection. She has been using OTC cough drops.    Past Medical History:  Diagnosis Date  . Abnormal Pap smear of vagina and vaginal HPV    ascus   . Allergy   . Endometriosis   . GERD (gastroesophageal reflux disease)   . 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 History of migraine   . HPV in female   . IBS (irritable bowel syndrome)   . Liver disease    H/O hepatitis  . Mental disorder    anxiety  . Microscopic hematuria   . Migraines   . Peptic ulcer disease   . Skin cancer    left thigh  . Tobacco abuse 02/21/2013    Past Surgical History:  Procedure Laterality Date  . ABDOMINAL HYSTERECTOMY    . APPENDECTOMY    . CHOLECYSTECTOMY N/A 11/26/2015   Procedure: LAPAROSCOPIC CHOLECYSTECTOMY ;  Surgeon: Jules Husbands, MD;  Location: [facility] ORS;  Service: General;  Laterality: N/A;  . laparoscopically assisted vaginal hysterectomy  2006  . SALIVARY GLAND SURGERY    . submandular    . TUBAL LIGATION    . WISDOM TOOTH EXTRACTION      Family History  Problem Relation Age  of Onset  . Cancer Mother        colon   . Breast cancer Maternal Grandmother   . Thyroid cancer Maternal Aunt   . Breast cancer Maternal Aunt   . Cancer Paternal Aunt        colon    Social History   Tobacco Use  . Smoking status: Former Smoker    Packs/day: 1.00    Types: Cigarettes    Quit date: 03/03/2015    Years since quitting: 4.9  . Smokeless tobacco: Never Used  . Tobacco comment: Quit May 8  Substance Use Topics  . Alcohol use: No    Alcohol/week: 0.0 standard drinks  . Drug use: No     Current Outpatient Medications:  .  acyclovir (ZOVIRAX) 200 MG capsule, TAKE 2 CAPSULES BY MOUTH 2 TO 3 TIMES DAILY FOR 5 DAYS AS NEEDED, Disp: 60 capsule, Rfl: 6 .  albuterol (VENTOLIN HFA) 108 (90 Base) MCG/ACT inhaler, Inhale 2 puffs into the lungs every 4 (four) hours as needed for wheezing or shortness of breath., Disp: 8 g, Rfl: 1 .  pantoprazole (PROTONIX) 40 MG tablet, Take 1 tablet (40 mg total) by mouth daily., Disp: 30 tablet, Rfl: 11 .  Plecanatide (TRULANCE) 3 MG TABS, Take 3 mg by mouth daily., Disp: 30 tablet, Rfl: 1 .  promethazine (PHENERGAN) 25 MG tablet, Take 1q8h prn N&V with Migraines, Disp: 30 tablet, Rfl: 11 .  sertraline (ZOLOFT) 50 MG tablet, 1/2 tab po daily x 1 week then 1 tab po daily, Disp: 90 tablet, Rfl: 3 .  SUMAtriptan (IMITREX) 50 MG tablet, Take 1 tablet (50 mg total) by mouth every 2 (two) hours as needed for migraine., Disp: 10 tablet, Rfl: 11 .  metoCLOPramide (REGLAN) 5 MG tablet, Take 1 tablet (5 mg total) by mouth every 8 (eight) hours as needed for up to 5 days for nausea or vomiting., Disp: 15 tablet, Rfl: 0 .  phenazopyridine (PYRIDIUM) 100 MG tablet, Take 1 tablet (100 mg total) by mouth 3 (three) times daily as needed for pain (with food). (Patient not taking: Reported on 02/09/2020), Disp: 10 tablet, Rfl: 0 .  sulfamethoxazole-trimethoprim (BACTRIM DS) 800-160 MG tablet, Take 1 tablet by mouth 2 (two) times daily. (Patient not taking: Reported  on 02/09/2020), Disp: 6 tablet, Rfl: 0  Allergies  Allergen Reactions  . Amoxicillin-Pot Clavulanate Nausea And Vomiting and Other (See Comments)    Has patient had a PCN reaction causing immediate rash, facial/tongue/throat swelling, SOB or lightheadedness with hypotension: No Has patient had a PCN reaction causing severe rash involving mucus membranes or skin necrosis: No Has patient had a PCN reaction that required hospitalization No Has patient had a PCN reaction occurring within the last 10 years: No If all of the above answers are "NO", then may proceed with Cephalosporin use. Other reaction(s): Vomiting  . Codeine Nausea And Vomiting  . Latex Rash  . Aspirin Nausea And Vomiting  . Neosporin [Neomycin-Bacitracin Zn-Polymyx]       ROS: See pertinent positives and negatives per HPI.   EXAM:  VITALS per patient if applicable: Ht 5\' 4"  (1.626 m)   Wt 177 lb (80.3 kg)   BMI 30.38 kg/m    GENERAL: alert, oriented, in no acute distress  NECK: normal movements of the head and neck  LUNGS: on inspection no signs of respiratory distress, breathing rate appears normal, no obvious gross SOB, gasping or wheezing, no conversational dyspnea  PSYCH/NEURO: pleasant and cooperative, speech and thought processing grossly intact   ASSESSMENT AND PLAN: 1. Acute non-recurrent sinusitis, unspecified location - increased water intake - nasal saline spray, flonase, claritin or zyrtec 1 tab daily - can add mucinex 1 tab BID Rx: - azithromycin (ZITHROMAX) 250 MG tablet; 2 tabs po x 1 dose then 1 tab po daily  Dispense: 6 tablet; Refill: 0 - f/u PRN   I discussed the assessment and treatment plan with the patient. The patient was provided an opportunity to ask questions and all were answered. The patient agreed with the plan and demonstrated an understanding of the instructions.   The patient was advised to call back or seek an in-person evaluation if the symptoms worsen or if the  condition fails to improve as anticipated.   Letta Median, DO

## 2020-02-09 NOTE — Patient Instructions (Addendum)
Nasal saline spray 3-4x/day Mucinex 1 tab twice per day and/or claritin or zyrtec 1 tab daily Flonase 2 sprays each side of your nose daily Azithromycin (antibiotic) as directed

## 2020-02-22 ENCOUNTER — Other Ambulatory Visit: Payer: Self-pay

## 2020-03-27 ENCOUNTER — Other Ambulatory Visit: Payer: Self-pay

## 2020-03-27 ENCOUNTER — Telehealth: Payer: Self-pay | Admitting: Family Medicine

## 2020-03-27 NOTE — Telephone Encounter (Signed)
It is fine with me for her to be seen and have her grandchildren with her

## 2020-03-27 NOTE — Telephone Encounter (Signed)
Pt is aware.  

## 2020-03-27 NOTE — Telephone Encounter (Signed)
Please see message and advise.  Thank you. ° °

## 2020-03-27 NOTE — Telephone Encounter (Signed)
Patient called and has an appointment tomorrow and will have four of her grandkids that are 11,10, 8 and 3. Patient stated that she just received custody and doesn't have anyone else to watch them. Offered to see if she wanted to do mychart but patient prefers in office visit due to appointment. Is this ok? CB is [phone]

## 2020-03-28 ENCOUNTER — Encounter: Payer: Self-pay | Admitting: Family Medicine

## 2020-03-28 ENCOUNTER — Ambulatory Visit (INDEPENDENT_AMBULATORY_CARE_PROVIDER_SITE_OTHER): Payer: BC Managed Care – PPO | Admitting: Family Medicine

## 2020-03-28 VITALS — BP 110/70 | HR 97 | Temp 97.7°F | Ht 64.0 in | Wt 181.8 lb

## 2020-03-28 DIAGNOSIS — R231 Pallor: Secondary | ICD-10-CM | POA: Diagnosis not present

## 2020-03-28 DIAGNOSIS — R7301 Impaired fasting glucose: Secondary | ICD-10-CM | POA: Diagnosis not present

## 2020-03-28 DIAGNOSIS — E785 Hyperlipidemia, unspecified: Secondary | ICD-10-CM

## 2020-03-28 LAB — LIPID PANEL
Cholesterol: 267 mg/dL — ABNORMAL HIGH (ref 0–200)
HDL: 46.1 mg/dL (ref >39.00)
NonHDL: 221.06
Total CHOL/HDL Ratio: 6
Triglycerides: 237 mg/dL — ABNORMAL HIGH (ref 0.0–149.0)
VLDL: 47.4 mg/dL — ABNORMAL HIGH (ref 0.0–40.0)

## 2020-03-28 LAB — LDL CHOLESTEROL, DIRECT: Direct LDL: 170 mg/dL

## 2020-03-28 LAB — HEMOGLOBIN A1C: Hgb A1c MFr Bld: 6 % (ref 4.6–6.5)
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
## 2020-03-28 NOTE — Patient Instructions (Signed)
Livedo reticularis   Vit C 1200mg  Vit D 1000IU daily Zinc 50mg 

## 2020-04-03 ENCOUNTER — Encounter: Payer: Self-pay | Admitting: Family Medicine

## 2020-04-04 ENCOUNTER — Encounter: Payer: Self-pay | Admitting: Family Medicine

## 2020-04-04 DIAGNOSIS — R7301 Impaired fasting glucose: Secondary | ICD-10-CM | POA: Insufficient documentation

## 2020-04-04 DIAGNOSIS — E785 Hyperlipidemia, unspecified: Secondary | ICD-10-CM

## 2020-04-04 NOTE — Telephone Encounter (Signed)
Please see other encounter.

## 2020-04-05 NOTE — Telephone Encounter (Signed)
Pt called and was asking why she didn't get a response back, I let her know that Dr Bryan Lemma was out of the office and wont be back until next Wednesday but she wanted to know what she should do in the mean time and would like a call back to let her know, she said you can leave a detailed voicemail on [phone] because she might not be able to answer because she will be at work.

## 2020-04-10 MED ORDER — SIMVASTATIN 20 MG PO TABS: 20.0000 mg | ORAL_TABLET | Freq: Every day | ORAL | 3 refills | Status: DC

## 2020-04-10 NOTE — Telephone Encounter (Signed)
Responded to pt in another my chart message.  

## 2020-05-29 ENCOUNTER — Telehealth: Payer: Self-pay | Admitting: Family Medicine

## 2020-05-29 NOTE — Telephone Encounter (Signed)
Patient is calling and stated that she is experiencing a cold and congestion and wanted to see if Dr. Bryan Lemma could call her something in to the pharmacy. Patient declined appointment and stated that she just started a new job and couldn't get off for an appointment, please advise. CB is [phone].

## 2020-05-29 NOTE — Telephone Encounter (Signed)
For cold/congestion symptoms: Use nasal saline spray 3x/day Use flonase 2 sprays each nostril daily Mucinex 1 tab twice per day Vicks rub, cool mist humidifier  Increase water intake

## 2020-05-29 NOTE — Telephone Encounter (Signed)
Please see message and advise.  Thank you. ° °

## 2020-05-29 NOTE — Telephone Encounter (Signed)
LDM on pt's VM of Dr. Lurline Del suggestions below.

## 2020-06-05 ENCOUNTER — Encounter: Payer: Self-pay | Admitting: Family Medicine

## 2020-06-05 ENCOUNTER — Ambulatory Visit: Payer: 59 | Admitting: Family Medicine

## 2020-06-05 ENCOUNTER — Ambulatory Visit (INDEPENDENT_AMBULATORY_CARE_PROVIDER_SITE_OTHER)
Admission: RE | Admit: 2020-06-05 | Discharge: 2020-06-05 | Disposition: A | Discharge status: Home / Self Care | Payer: 59 | Source: Ambulatory Visit | Attending: Family Medicine | Admitting: Family Medicine

## 2020-06-05 ENCOUNTER — Other Ambulatory Visit: Payer: Self-pay

## 2020-06-05 VITALS — BP 120/70 | HR 84 | Temp 97.8°F | Ht 64.0 in | Wt 179.2 lb

## 2020-06-05 DIAGNOSIS — G8929 Other chronic pain: Secondary | ICD-10-CM | POA: Diagnosis not present

## 2020-06-05 DIAGNOSIS — M549 Dorsalgia, unspecified: Secondary | ICD-10-CM

## 2020-06-05 DIAGNOSIS — M25571 Pain in right ankle and joints of right foot: Secondary | ICD-10-CM

## 2020-06-05 DIAGNOSIS — R200 Anesthesia of skin: Secondary | ICD-10-CM | POA: Diagnosis not present

## 2020-06-05 DIAGNOSIS — G4701 Insomnia due to medical condition: Secondary | ICD-10-CM

## 2020-06-05 DIAGNOSIS — G90521 Complex regional pain syndrome I of right lower limb: Secondary | ICD-10-CM | POA: Diagnosis not present

## 2020-06-05 DIAGNOSIS — M19171 Post-traumatic osteoarthritis, right ankle and foot: Secondary | ICD-10-CM

## 2020-06-05 MED ORDER — PREGABALIN 75 MG PO CAPS: 75.0000 mg | ORAL_CAPSULE | Freq: Two times a day (BID) | ORAL | 3 refills | Status: DC

## 2020-06-05 MED ORDER — DULOXETINE HCL 30 MG PO CPEP: ORAL_CAPSULE | ORAL | 0 refills | Status: DC

## 2020-06-05 MED ORDER — DULOXETINE HCL 60 MG PO CPEP: 60.0000 mg | ORAL_CAPSULE | Freq: Every day | ORAL | 3 refills | Status: DC
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
## 2020-06-06 ENCOUNTER — Telehealth: Payer: Self-pay | Admitting: Family Medicine

## 2020-06-06 MED ORDER — DULOXETINE HCL 30 MG PO CPEP: ORAL_CAPSULE | ORAL | 0 refills | Status: DC

## 2020-06-06 NOTE — Telephone Encounter (Signed)
Pt calling checking on her rx that dr copland gave her yesterday.   She stated she went to pick them up and 2 needed more information  1 needed prior autho  and 1 needed more instructions  Please advise when this is done and please leave message if pt doesn't answer  [facility]

## 2020-06-06 NOTE — Telephone Encounter (Signed)
I did not write correctly, apologies  1 tablet po daily for 2 weeks, then increase to 2 tablets po daily for 2 weeks

## 2020-06-06 NOTE — Telephone Encounter (Signed)
Pharmacy is questioning the directions of the 30mg  duloxetine. It reads take 1 by mouth twice a day for 2 weeks then take 2. Should it be take 1 capsule daily for 2 weeks then increase to 2?

## 2020-06-06 NOTE — Telephone Encounter (Signed)
Sent in corrected rx  

## 2020-06-07 NOTE — Telephone Encounter (Signed)
Pt  Called back stating lyrica was denied from insurance  She was wonder why.  Please advise   Best number [phone]

## 2020-06-10 NOTE — Telephone Encounter (Signed)
PA completed on CoverMyMeds.  Sent for a review and can take 72 hours for a decision.  I left message for Grettell with this information and advised I would call her once I hear back from her insurance.

## 2020-06-12 NOTE — Telephone Encounter (Signed)
PA denied.  Request was denied-doctor requested the drug for an unapproved medical condition.  Dx: Sympathetic dystrophy/complex regional pain syndrome.  FYI to Dr. Lorelei Pont.

## 2020-06-25 ENCOUNTER — Telehealth (INDEPENDENT_AMBULATORY_CARE_PROVIDER_SITE_OTHER): Payer: 59 | Admitting: Nurse Practitioner

## 2020-06-25 ENCOUNTER — Encounter: Payer: Self-pay | Admitting: Nurse Practitioner

## 2020-06-25 VITALS — BP 112/63 | HR 95 | Temp 98.1°F | Ht 64.0 in | Wt 178.0 lb

## 2020-06-25 DIAGNOSIS — J0141 Acute recurrent pansinusitis: Secondary | ICD-10-CM

## 2020-06-25 DIAGNOSIS — J209 Acute bronchitis, unspecified: Secondary | ICD-10-CM

## 2020-06-25 MED ORDER — PROMETHAZINE-CODEINE 6.25-10 MG/5ML PO SYRP: 5.0000 mL | ORAL_SOLUTION | Freq: Three times a day (TID) | ORAL | 0 refills | Status: DC | PRN

## 2020-06-25 MED ORDER — AZITHROMYCIN 250 MG PO TABS: 250.0000 mg | ORAL_TABLET | Freq: Every day | ORAL | 0 refills | Status: DC

## 2020-06-25 NOTE — Progress Notes (Signed)
Virtual Visit via Video Note  I connected with@ on 06/25/20 at 11:30 AM EDT by a video enabled telemedicine application and verified that I am speaking with the correct person using two identifiers.  Location: Patient:Home Provider: Office Participants: patient and provider  I discussed the limitations of evaluation and management by telemedicine and the availability of in person appointments. I also discussed with the patient that there may be a patient responsible charge related to this service. The patient expressed understanding and agreed to proceed.  CC:Pt c/o chest congestion, productive cough with green mucus, sore throat, runny nose, and headaches x4-5 days. Pt states she feels like she has bronchitis.  Negative COVID test 2days ago per patient  History of Present Illness: Cough This is a recurrent problem. The current episode started 1 to 4 weeks ago. The problem has been waxing and waning. The problem occurs constantly. The cough is productive of purulent sputum. Associated symptoms include chills, ear pain, headaches, nasal congestion, postnasal drip, rhinorrhea, a sore throat and wheezing. Pertinent negatives include no chest pain, fever, heartburn, hemoptysis, myalgias, rash or shortness of breath. The symptoms are aggravated by lying down. She has tried a beta-agonist inhaler and OTC cough suppressant for the symptoms. The treatment provided no relief. Her past medical history is significant for bronchitis and environmental allergies. There is no history of asthma.  had similar symptoms in December and April. Treated with azithromycin and promethazine/codeine.  Observations/Objective: Physical Exam Constitutional:      General: She is not in acute distress. Pulmonary:     Effort: Pulmonary effort is normal.  Neurological:     Mental Status: She is alert and oriented to person, place, and time.   PMP database reviewed: promethazine-codeine last filled  10/24/2019.  Assessment and Plan: Danielle Strickland was seen today for acute visit.  Diagnoses and all orders for this visit:  Acute bronchitis, unspecified organism -     promethazine-codeine (PHENERGAN WITH CODEINE) 6.25-10 MG/5ML syrup; Take 5 mLs by mouth every 8 (eight) hours as needed for cough.  Acute recurrent pansinusitis -     azithromycin (ZITHROMAX Z-PAK) 250 MG tablet; Take 1 tablet (250 mg total) by mouth daily. Take 2tabs on first day, then 1tab once a day till complete   Follow Up Instructions: Continue albuterol and mucinex DM Continue discussing need for ENT referral due to reccurrent laryngitis and sinusitis.   I discussed the assessment and treatment plan with the patient. The patient was provided an opportunity to ask questions and all were answered. The patient agreed with the plan and demonstrated an understanding of the instructions.   The patient was advised to call back or seek an in-person evaluation if the symptoms worsen or if the condition fails to improve as anticipated.  Wilfred Lacy, NP

## 2020-07-11 ENCOUNTER — Encounter: Payer: Self-pay | Admitting: Family Medicine

## 2020-07-17 ENCOUNTER — Ambulatory Visit: Payer: 59 | Admitting: Family Medicine

## 2020-08-27 ENCOUNTER — Other Ambulatory Visit: Payer: Self-pay | Admitting: Obstetrics and Gynecology

## 2020-08-27 DIAGNOSIS — Z1231 Encounter for screening mammogram for malignant neoplasm of breast: Secondary | ICD-10-CM

## 2020-08-28 ENCOUNTER — Other Ambulatory Visit: Payer: Self-pay

## 2020-08-28 ENCOUNTER — Telehealth: Payer: Self-pay | Admitting: Gastroenterology

## 2020-08-28 MED ORDER — PANTOPRAZOLE SODIUM 40 MG PO TBEC: 40.0000 mg | DELAYED_RELEASE_TABLET | Freq: Every day | ORAL | 11 refills | Status: DC

## 2020-08-28 NOTE — Telephone Encounter (Signed)
Patient requesting refill of medication pantoprazole sent to [facility] on [address]. Pt scheduled yearly follow up appt for 1.11.21.

## 2020-08-28 NOTE — Telephone Encounter (Signed)
Refill for Pantoprazole has been sent to pt's pharmacy. Pt notified via mychart.

## 2020-10-14 ENCOUNTER — Ambulatory Visit
Admission: RE | Admit: 2020-10-14 | Discharge: 2020-10-14 | Disposition: A | Discharge status: Home / Self Care | Payer: 59 | Source: Ambulatory Visit | Attending: Emergency Medicine | Admitting: Emergency Medicine

## 2020-10-14 VITALS — BP 128/70 | HR 98 | Temp 99.3°F | Resp 18 | Ht 64.0 in | Wt 178.0 lb

## 2020-10-14 DIAGNOSIS — R059 Cough, unspecified: Secondary | ICD-10-CM

## 2020-10-14 DIAGNOSIS — J22 Unspecified acute lower respiratory infection: Secondary | ICD-10-CM | POA: Diagnosis not present

## 2020-10-14 MED ORDER — DOXYCYCLINE HYCLATE 100 MG PO CAPS: 100.0000 mg | ORAL_CAPSULE | Freq: Two times a day (BID) | ORAL | 0 refills | Status: AC

## 2020-10-14 MED ORDER — BENZONATATE 100 MG PO CAPS: 100.0000 mg | ORAL_CAPSULE | Freq: Three times a day (TID) | ORAL | 0 refills | Status: DC | PRN

## 2020-10-14 MED ORDER — PREDNISONE 20 MG PO TABS: 40.0000 mg | ORAL_TABLET | Freq: Every day | ORAL | 0 refills | Status: AC

## 2020-10-14 NOTE — Discharge Instructions (Addendum)
Push fluids to ensure adequate hydration and keep secretions thin.  Tylenol and/or ibuprofen as needed for pain or fevers.  5 days of prednisone Tessalon as needed for cough. Complete course of antibiotics.  Self isolate until covid results are back and negative.  Will notify you by phone of any positive findings. Your negative results will be sent through your MyChart.     Return for any worsening of symptoms.

## 2020-10-14 NOTE — ED Triage Notes (Signed)
Pt reports having a cough that began on Friday. sts last night she began to hear crackling sounds. sts she has had pneumonia before and this feels the same. Also reports having chills last night. Also sts R ear hurt and having some facial pain.

## 2020-10-14 NOTE — ED Provider Notes (Signed)
Danielle Strickland    CSN: [id number] Arrival date & time: 10/14/20  1246      History   Chief Complaint Chief Complaint  Patient presents with  . Appointment  . Cough    HPI Danielle Strickland is a 54 y.o. female.   Danielle Strickland presents with complaints of cough, congestion, "lung discomfort" which started over the past few days. Worse last night, endorses rigors. No shortness of breath . Cough was productive this morning. Achy. Right ear pain. No sore throat. No gi symptoms. Had covid-19 one year ago. Hasn't taken any medcations for symptoms. Has been around various children via family and church, and works at the [employer].    ROS per HPI, negative if not otherwise mentioned.      Past Medical History:  Diagnosis Date  . Abnormal Pap smear of vagina and vaginal HPV    ascus   . Allergy   . Endometriosis   . GERD (gastroesophageal reflux disease)   . 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 History of migraine   . HPV in female   . IBS (irritable bowel syndrome)   . Liver disease    H/O hepatitis  . Mental disorder    anxiety  . Microscopic hematuria   . Migraines   . Peptic ulcer disease   . Skin cancer    left thigh  . Tobacco abuse 02/21/2013    Patient Active Problem List   Diagnosis Date Noted  . IFG (impaired fasting glucose) 04/04/2020  . Encounter for completion of form with patient 11/20/2019  . COVID-19 virus infection 10/30/2019  . SOB (shortness of breath) 08/09/2018  . Palpitation 07/22/2018  . Hypercholesterolemia 05/10/2018  . Elevated TSH 05/10/2018  . Restless legs syndrome (RLS) 04/06/2018  . Anxiety 02/01/2018  . Gastroesophageal reflux disease 02/01/2018  . Inflammatory disease of liver 02/01/2018  . Menopausal syndrome 02/01/2018  . Migraine 02/01/2018  . Recurrent hematuria 02/01/2018  . Complex regional pain syndrome type 1 affecting right lower leg 09/03/2016  . PTSD (post-traumatic stress disorder)  03/02/2016  . Malleolar fracture 12/10/2015  . Hx MRSA infection 06/18/2015  . Abnormal chest x-ray 05/29/2015  . Family history of colon cancer 02/21/2013  . TMJ (dislocation of temporomandibular joint) 02/21/2013  . Allergy   . Skin cancer   . Peptic ulcer disease   . Generalized anxiety disorder   . Herpes simplex without mention of complication   . IBS (irritable bowel syndrome)   . History of migraine     Past Surgical History:  Procedure Laterality Date  . ABDOMINAL HYSTERECTOMY    . APPENDECTOMY    . CHOLECYSTECTOMY N/A 11/26/2015   Procedure: LAPAROSCOPIC CHOLECYSTECTOMY ;  Surgeon: Jules Husbands, MD;  Location: [facility] ORS;  Service: General;  Laterality: N/A;  . laparoscopically assisted vaginal hysterectomy  2006  . SALIVARY GLAND SURGERY    . submandular    . TUBAL LIGATION    . WISDOM TOOTH EXTRACTION      OB History    Gravida  3   Para  3   Term  3   Preterm      AB      Living  3     SAB      IAB      Ectopic      Multiple      Live Births  3  Home Medications    Prior to Admission medications   Medication Sig Start Date End Date Taking? Authorizing Provider  acyclovir (ZOVIRAX) 200 MG capsule TAKE 2 CAPSULES BY MOUTH 2 TO 3 TIMES DAILY FOR 5 DAYS AS NEEDED 11/20/19   Lucille Passy, MD  albuterol (VENTOLIN HFA) 108 (90 Base) MCG/ACT inhaler Inhale 2 puffs into the lungs every 4 (four) hours as needed for wheezing or shortness of breath. 10/25/19   Scot Jun, FNP  azithromycin (ZITHROMAX Z-PAK) 250 MG tablet Take 1 tablet (250 mg total) by mouth daily. Take 2tabs on first day, then 1tab once a day till complete 06/25/20   Nche, Charlene Brooke, NP  benzonatate (TESSALON) 100 MG capsule Take 1-2 capsules (100-200 mg total) by mouth 3 (three) times daily as needed for cough. 10/14/20   Zigmund Gottron, NP  doxycycline (VIBRAMYCIN) 100 MG capsule Take 1 capsule (100 mg total) by mouth 2 (two) times daily for 10 days. 10/14/20  10/24/20  Zigmund Gottron, NP  DULoxetine (CYMBALTA) 30 MG capsule 1 tablet daily for 2 weeks, then increase to 2 tablets 06/06/20   Copland, Frederico Hamman, MD  DULoxetine (CYMBALTA) 60 MG capsule Take 1 capsule (60 mg total) by mouth daily. 06/05/20   Copland, Frederico Hamman, MD  pantoprazole (PROTONIX) 40 MG tablet Take 1 tablet (40 mg total) by mouth daily. 08/28/20   Lucilla Lame, MD  Plecanatide (TRULANCE) 3 MG TABS Take 3 mg by mouth daily. 10/17/19   Lucilla Lame, MD  predniSONE (DELTASONE) 20 MG tablet Take 2 tablets (40 mg total) by mouth daily with breakfast for 5 days. 10/14/20 10/19/20  Zigmund Gottron, NP  pregabalin (LYRICA) 75 MG capsule Take 1 capsule (75 mg total) by mouth 2 (two) times daily. Patient not taking: Reported on 06/25/2020 06/05/20   Owens Loffler, MD  promethazine (PHENERGAN) 25 MG tablet Take 1q8h prn N&V with Migraines 11/23/19   Lucille Passy, MD  promethazine-codeine [facility] WITH CODEINE) 6.25-10 MG/5ML syrup Take 5 mLs by mouth every 8 (eight) hours as needed for cough. 06/25/20   Nche, Charlene Brooke, NP  simvastatin (ZOCOR) 20 MG tablet Take 1 tablet (20 mg total) by mouth at bedtime. 04/10/20   Cirigliano, Garvin Fila, DO  SUMAtriptan (IMITREX) 50 MG tablet Take 1 tablet (50 mg total) by mouth every 2 (two) hours as needed for migraine. 11/23/19   Lucille Passy, MD    Family History Family History  Problem Relation Age of Onset  . Cancer Mother        colon   . Breast cancer Maternal Grandmother   . Thyroid cancer Maternal Aunt   . Breast cancer Maternal Aunt   . Cancer Paternal Aunt        colon    Social History Social History   Tobacco Use  . Smoking status: Former Smoker    Packs/day: 1.00    Types: Cigarettes    Quit date: 03/03/2015    Years since quitting: 5.6  . Smokeless tobacco: Never Used  . Tobacco comment: Quit May 8  Substance Use Topics  . Alcohol use: No    Alcohol/week: 0.0 standard drinks  . Drug use: No     Allergies   Amoxicillin-pot  clavulanate, Latex, Aspirin, and Neosporin [neomycin-bacitracin zn-polymyx]   Review of Systems Review of Systems   Physical Exam Triage Vital Signs ED Triage Vitals  Enc Vitals Group     BP 10/14/20 1253 128/70     Pulse Rate  10/14/20 1253 98     Resp 10/14/20 1253 18     Temp 10/14/20 1253 99.3 F (37.4 C)     Temp Source 10/14/20 1253 Oral     SpO2 10/14/20 1253 95 %     Weight 10/14/20 1256 178 lb (80.7 kg)     Height 10/14/20 1256 5\' 4"  (1.626 m)     Head Circumference --      Peak Flow --      Pain Score 10/14/20 1256 0     Pain Loc --      Pain Edu? --      Excl. in [place]? --    No data found.  Updated Vital Signs BP 128/70 (BP Location: Left Arm)   Pulse 98   Temp 99.3 F (37.4 C) (Oral)   Resp 18   Ht 5\' 4"  (1.626 m)   Wt 178 lb (80.7 kg)   SpO2 95%   BMI 30.55 kg/m    Physical Exam Constitutional:      General: She is not in acute distress.    Appearance: She is well-developed.  HENT:     Mouth/Throat:     Mouth: Mucous membranes are moist.  Eyes:     Extraocular Movements: Extraocular movements intact.     Pupils: Pupils are equal, round, and reactive to light.  Cardiovascular:     Rate and Rhythm: Normal rate and regular rhythm.  Pulmonary:     Effort: Pulmonary effort is normal. No respiratory distress.     Comments: Coarse lung sounds noted and clear with cough; cough triggered with deep inspiration Skin:    General: Skin is warm and dry.  Neurological:     Mental Status: She is alert and oriented to person, place, and time.      UC Treatments / Results  Labs (all labs ordered are listed, but only abnormal results are displayed) Labs Reviewed  COVID-19, FLU A+B NAA    EKG   Radiology No results found.  Procedures Procedures (including critical care time)  Medications Ordered in UC Medications - No data to display  Initial Impression / Assessment and Plan / UC Course  I have reviewed the triage vital signs and the nursing  notes.  Pertinent labs & imaging results that were available during my care of the patient were reviewed by me and considered in my medical decision making (see chart for details).     Low grade temp here today, endorses rigors, cough, congestion. Noted o2 of 95%. Empiric treatment with doxycycline provided here today, deferred imaging. Discussed viral etiology as well, patient declines covid/ flu testing due to uncertainty of insurance coverage/ cost. Return precautions provided. Patient verbalized understanding and agreeable to plan.   Final Clinical Impressions(s) / UC Diagnoses   Final diagnoses:  Lower respiratory tract infection  Cough     Discharge Instructions     Push fluids to ensure adequate hydration and keep secretions thin.  Tylenol and/or ibuprofen as needed for pain or fevers.  5 days of prednisone Tessalon as needed for cough. Complete course of antibiotics.  Self isolate until covid results are back and negative.  Will notify you by phone of any positive findings. Your negative results will be sent through your MyChart.     Return for any worsening of symptoms.    ED Prescriptions    Medication Sig Dispense Auth. Provider   doxycycline (VIBRAMYCIN) 100 MG capsule Take 1 capsule (100 mg total) by mouth 2 (two)  times daily for 10 days. 20 capsule Augusto Gamble B, NP   predniSONE (DELTASONE) 20 MG tablet Take 2 tablets (40 mg total) by mouth daily with breakfast for 5 days. 10 tablet Augusto Gamble B, NP   benzonatate (TESSALON) 100 MG capsule Take 1-2 capsules (100-200 mg total) by mouth 3 (three) times daily as needed for cough. 21 capsule Zigmund Gottron, NP     PDMP not reviewed this encounter.   Zigmund Gottron, NP 10/14/20 1429

## 2020-10-21 ENCOUNTER — Ambulatory Visit
Admission: RE | Admit: 2020-10-21 | Discharge: 2020-10-21 | Disposition: A | Discharge status: Home / Self Care | Payer: 59 | Source: Ambulatory Visit | Attending: Obstetrics and Gynecology | Admitting: Obstetrics and Gynecology

## 2020-10-21 ENCOUNTER — Other Ambulatory Visit: Payer: Self-pay

## 2020-10-21 DIAGNOSIS — Z1231 Encounter for screening mammogram for malignant neoplasm of breast: Secondary | ICD-10-CM

## 2020-10-23 ENCOUNTER — Telehealth: Payer: Self-pay | Admitting: Family Medicine

## 2020-10-23 MED ORDER — FLUCONAZOLE 150 MG PO TABS: 150.0000 mg | ORAL_TABLET | Freq: Every day | ORAL | 0 refills | Status: DC

## 2020-10-23 NOTE — Telephone Encounter (Signed)
Treating for yeast infection

## 2020-11-05 ENCOUNTER — Telehealth: Payer: 59 | Admitting: Gastroenterology

## 2020-11-05 ENCOUNTER — Other Ambulatory Visit: Payer: Self-pay

## 2020-11-29 ENCOUNTER — Emergency Department (HOSPITAL_COMMUNITY): Payer: 59

## 2020-11-29 ENCOUNTER — Other Ambulatory Visit: Payer: Self-pay

## 2020-11-29 ENCOUNTER — Emergency Department (HOSPITAL_COMMUNITY)
Admission: EM | Admit: 2020-11-29 | Discharge: 2020-11-30 | Disposition: A | Discharge status: Home / Self Care | Payer: 59 | Attending: Emergency Medicine | Admitting: Emergency Medicine

## 2020-11-29 DIAGNOSIS — Y999 Unspecified external cause status: Secondary | ICD-10-CM | POA: Insufficient documentation

## 2020-11-29 DIAGNOSIS — Y939 Activity, unspecified: Secondary | ICD-10-CM | POA: Insufficient documentation

## 2020-11-29 DIAGNOSIS — M25512 Pain in left shoulder: Secondary | ICD-10-CM | POA: Insufficient documentation

## 2020-11-29 DIAGNOSIS — S0083XA Contusion of other part of head, initial encounter: Secondary | ICD-10-CM | POA: Insufficient documentation

## 2020-11-29 DIAGNOSIS — S0990XA Unspecified injury of head, initial encounter: Secondary | ICD-10-CM | POA: Diagnosis present

## 2020-11-29 DIAGNOSIS — Y92410 Unspecified street and highway as the place of occurrence of the external cause: Secondary | ICD-10-CM | POA: Diagnosis not present
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
## 2020-11-30 MED ORDER — METHOCARBAMOL 500 MG PO TABS: 500.0000 mg | ORAL_TABLET | Freq: Two times a day (BID) | ORAL | 0 refills | Status: DC

## 2020-11-30 MED ORDER — KETOROLAC TROMETHAMINE 60 MG/2ML IM SOLN
60.0000 mg | Freq: Once | INTRAMUSCULAR | Status: AC
  Administered 2020-11-30: 60 mg via INTRAMUSCULAR
  Filled 2020-11-30: qty 2

## 2020-11-30 MED ORDER — METHOCARBAMOL 500 MG PO TABS
500.0000 mg | ORAL_TABLET | Freq: Once | ORAL | Status: AC
  Administered 2020-11-30: 500 mg via ORAL
  Filled 2020-11-30: qty 1
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
## 2020-11-30 NOTE — ED Notes (Signed)
Discharge instructions discussed with pt. Pt verbalized understanding. Pt stable and ambulatory. No signature pad available. 

## 2020-11-30 NOTE — Discharge Instructions (Signed)
Take NSAIDs or Tylenol as needed for the next week. Take this medicine with food. Take muscle relaxer at bedtime to help you sleep. This medicine makes you drowsy so do not take before driving or work Use a heating pad for sore muscles - use for 20 minutes several times a day Try gentle range of motion exercises Return for worsening symptoms  

## 2020-12-03 ENCOUNTER — Ambulatory Visit (INDEPENDENT_AMBULATORY_CARE_PROVIDER_SITE_OTHER): Payer: 59 | Admitting: Family Medicine

## 2020-12-03 ENCOUNTER — Other Ambulatory Visit: Payer: Self-pay

## 2020-12-03 ENCOUNTER — Encounter: Payer: Self-pay | Admitting: Family Medicine

## 2020-12-03 VITALS — BP 122/70 | HR 103 | Temp 97.7°F | Ht 64.0 in | Wt 186.6 lb

## 2020-12-03 DIAGNOSIS — T07XXXA Unspecified multiple injuries, initial encounter: Secondary | ICD-10-CM | POA: Diagnosis not present

## 2020-12-03 DIAGNOSIS — F430 Acute stress reaction: Secondary | ICD-10-CM

## 2020-12-03 MED ORDER — CYCLOBENZAPRINE HCL 10 MG PO TABS: 10.0000 mg | ORAL_TABLET | Freq: Three times a day (TID) | ORAL | 0 refills | Status: DC | PRN

## 2020-12-03 MED ORDER — ZOLPIDEM TARTRATE 5 MG PO TABS: 5.0000 mg | ORAL_TABLET | Freq: Every evening | ORAL | 1 refills | Status: DC | PRN
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
## 2020-12-19 ENCOUNTER — Other Ambulatory Visit: Payer: Self-pay

## 2020-12-20 ENCOUNTER — Encounter: Payer: Self-pay | Admitting: Family Medicine

## 2020-12-20 ENCOUNTER — Ambulatory Visit (INDEPENDENT_AMBULATORY_CARE_PROVIDER_SITE_OTHER): Payer: 59 | Admitting: Family Medicine

## 2020-12-20 VITALS — BP 138/80 | HR 86 | Temp 97.9°F | Ht 64.0 in | Wt 187.8 lb

## 2020-12-20 DIAGNOSIS — F4310 Post-traumatic stress disorder, unspecified: Secondary | ICD-10-CM | POA: Diagnosis not present

## 2020-12-20 DIAGNOSIS — T07XXXA Unspecified multiple injuries, initial encounter: Secondary | ICD-10-CM

## 2020-12-20 MED ORDER — DICLOFENAC SODIUM 75 MG PO TBEC: 75.0000 mg | DELAYED_RELEASE_TABLET | Freq: Two times a day (BID) | ORAL | 0 refills | Status: DC
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
## 2020-12-20 NOTE — Patient Instructions (Addendum)
Continue use of heat and movement. Continue Flexeril in the evenings and hold Ambien.

## 2021-01-06 ENCOUNTER — Telehealth: Payer: Self-pay | Admitting: Family Medicine

## 2021-01-06 NOTE — Telephone Encounter (Signed)
Patient states that the pain medication she was put on is not working and she is in excruciating pain. Please call her at [phone] and advise.

## 2021-01-06 NOTE — Telephone Encounter (Signed)
Spoke to patient and advised to schedule appt. Scheduled appointment for 01/07/21 @ 2:30 pm.  Dm/cma

## 2021-01-07 ENCOUNTER — Ambulatory Visit (INDEPENDENT_AMBULATORY_CARE_PROVIDER_SITE_OTHER): Payer: 59 | Admitting: Family Medicine

## 2021-01-07 ENCOUNTER — Encounter: Payer: Self-pay | Admitting: Family Medicine

## 2021-01-07 ENCOUNTER — Other Ambulatory Visit: Payer: Self-pay

## 2021-01-07 VITALS — BP 122/70 | HR 104 | Temp 97.6°F | Ht 64.0 in | Wt 180.2 lb

## 2021-01-07 DIAGNOSIS — M25512 Pain in left shoulder: Secondary | ICD-10-CM | POA: Diagnosis not present

## 2021-01-07 DIAGNOSIS — M542 Cervicalgia: Secondary | ICD-10-CM

## 2021-01-07 DIAGNOSIS — F4310 Post-traumatic stress disorder, unspecified: Secondary | ICD-10-CM

## 2021-01-07 DIAGNOSIS — M546 Pain in thoracic spine: Secondary | ICD-10-CM
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
## 2021-01-15 ENCOUNTER — Ambulatory Visit: Payer: 59 | Admitting: Family Medicine

## 2021-01-17 ENCOUNTER — Ambulatory Visit: Payer: 59 | Admitting: Family Medicine

## 2021-01-24 ENCOUNTER — Other Ambulatory Visit: Payer: Self-pay

## 2021-01-24 ENCOUNTER — Ambulatory Visit: Admission: RE | Admit: 2021-01-24 | Payer: 59 | Source: Ambulatory Visit

## 2021-01-24 ENCOUNTER — Other Ambulatory Visit: Payer: 59

## 2021-01-24 ENCOUNTER — Ambulatory Visit
Admission: RE | Admit: 2021-01-24 | Discharge: 2021-01-24 | Disposition: A | Discharge status: Home / Self Care | Payer: 59 | Source: Ambulatory Visit | Attending: Family Medicine | Admitting: Family Medicine

## 2021-01-24 DIAGNOSIS — M542 Cervicalgia: Secondary | ICD-10-CM | POA: Diagnosis present

## 2021-01-24 DIAGNOSIS — M25512 Pain in left shoulder: Secondary | ICD-10-CM | POA: Diagnosis present

## 2021-01-24 DIAGNOSIS — M546 Pain in thoracic spine: Secondary | ICD-10-CM | POA: Diagnosis present

## 2021-01-25 ENCOUNTER — Encounter: Payer: Self-pay | Admitting: Family Medicine

## 2021-01-25 DIAGNOSIS — M5030 Other cervical disc degeneration, unspecified cervical region: Secondary | ICD-10-CM | POA: Insufficient documentation

## 2021-01-25 DIAGNOSIS — I700 Atherosclerosis of aorta: Secondary | ICD-10-CM | POA: Insufficient documentation

## 2021-01-28 ENCOUNTER — Other Ambulatory Visit: Payer: Self-pay

## 2021-01-29 ENCOUNTER — Ambulatory Visit (INDEPENDENT_AMBULATORY_CARE_PROVIDER_SITE_OTHER): Payer: 59 | Admitting: Family Medicine

## 2021-01-29 ENCOUNTER — Encounter: Payer: Self-pay | Admitting: Family Medicine

## 2021-01-29 VITALS — BP 150/90 | HR 97 | Temp 98.4°F | Ht 64.0 in | Wt 189.6 lb

## 2021-01-29 DIAGNOSIS — M25512 Pain in left shoulder: Secondary | ICD-10-CM | POA: Diagnosis not present

## 2021-01-29 DIAGNOSIS — G43709 Chronic migraine without aura, not intractable, without status migrainosus: Secondary | ICD-10-CM

## 2021-01-29 DIAGNOSIS — M5030 Other cervical disc degeneration, unspecified cervical region: Secondary | ICD-10-CM | POA: Diagnosis not present

## 2021-01-29 MED ORDER — PREDNISONE 20 MG PO TABS: ORAL_TABLET | ORAL | 0 refills | Status: DC

## 2021-01-29 MED ORDER — PROMETHAZINE HCL 25 MG PO TABS: ORAL_TABLET | ORAL | 2 refills | Status: DC

## 2021-01-29 MED ORDER — SUMATRIPTAN SUCCINATE 50 MG PO TABS
50.0000 mg | ORAL_TABLET | ORAL | 2 refills | Status: DC | PRN
Start: 2021-01-29 — End: 2021-12-31

## 2021-01-29 MED ORDER — BENZONATATE 100 MG PO CAPS: 100.0000 mg | ORAL_CAPSULE | Freq: Two times a day (BID) | ORAL | 0 refills | Status: DC | PRN
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
## 2021-01-29 NOTE — Addendum Note (Signed)
Addended by: Ronnald Nian on: 01/29/2021 04:59 PM   Modules accepted: Orders

## 2021-02-03 ENCOUNTER — Ambulatory Visit: Payer: 59

## 2021-02-05 ENCOUNTER — Ambulatory Visit: Payer: 59

## 2021-02-05 ENCOUNTER — Other Ambulatory Visit: Payer: Self-pay

## 2021-02-05 ENCOUNTER — Ambulatory Visit (INDEPENDENT_AMBULATORY_CARE_PROVIDER_SITE_OTHER): Payer: 59 | Admitting: Family Medicine

## 2021-02-05 ENCOUNTER — Encounter: Payer: Self-pay | Admitting: Family Medicine

## 2021-02-05 VITALS — BP 120/80 | HR 86 | Temp 98.0°F | Ht 64.0 in | Wt 190.5 lb

## 2021-02-05 DIAGNOSIS — M25512 Pain in left shoulder: Secondary | ICD-10-CM | POA: Diagnosis not present

## 2021-02-05 DIAGNOSIS — F411 Generalized anxiety disorder: Secondary | ICD-10-CM

## 2021-02-05 DIAGNOSIS — M5030 Other cervical disc degeneration, unspecified cervical region: Secondary | ICD-10-CM | POA: Diagnosis not present

## 2021-02-05 DIAGNOSIS — M7502 Adhesive capsulitis of left shoulder: Secondary | ICD-10-CM | POA: Diagnosis not present

## 2021-02-05 MED ORDER — DIAZEPAM 5 MG PO TABS: ORAL_TABLET | ORAL | 0 refills | Status: DC
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
## 2021-02-12 ENCOUNTER — Other Ambulatory Visit: Payer: Self-pay

## 2021-02-12 ENCOUNTER — Ambulatory Visit: Payer: 59 | Attending: Family Medicine

## 2021-02-12 DIAGNOSIS — M25512 Pain in left shoulder: Secondary | ICD-10-CM | POA: Diagnosis not present

## 2021-02-12 DIAGNOSIS — M6281 Muscle weakness (generalized): Secondary | ICD-10-CM | POA: Diagnosis present

## 2021-02-12 DIAGNOSIS — M542 Cervicalgia: Secondary | ICD-10-CM | POA: Diagnosis present
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
## 2021-02-16 ENCOUNTER — Other Ambulatory Visit: Payer: Self-pay

## 2021-02-16 ENCOUNTER — Ambulatory Visit
Admission: RE | Admit: 2021-02-16 | Discharge: 2021-02-16 | Disposition: A | Discharge status: Home / Self Care | Payer: 59 | Source: Ambulatory Visit | Attending: Family Medicine | Admitting: Family Medicine

## 2021-02-16 DIAGNOSIS — M25512 Pain in left shoulder: Secondary | ICD-10-CM

## 2021-02-18 ENCOUNTER — Other Ambulatory Visit: Payer: Self-pay | Admitting: Family Medicine

## 2021-02-18 DIAGNOSIS — M25612 Stiffness of left shoulder, not elsewhere classified: Secondary | ICD-10-CM

## 2021-02-18 DIAGNOSIS — M25512 Pain in left shoulder: Secondary | ICD-10-CM

## 2021-02-18 DIAGNOSIS — S46012A Strain of muscle(s) and tendon(s) of the rotator cuff of left shoulder, initial encounter: Secondary | ICD-10-CM

## 2021-02-18 NOTE — Telephone Encounter (Signed)
POC reviewed on phone.  See order note.
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
## 2021-02-24 ENCOUNTER — Other Ambulatory Visit: Payer: Self-pay

## 2021-02-24 ENCOUNTER — Ambulatory Visit: Payer: 59 | Attending: Family Medicine

## 2021-02-24 DIAGNOSIS — M25512 Pain in left shoulder: Secondary | ICD-10-CM | POA: Insufficient documentation

## 2021-02-24 DIAGNOSIS — M6281 Muscle weakness (generalized): Secondary | ICD-10-CM | POA: Insufficient documentation

## 2021-02-24 DIAGNOSIS — M542 Cervicalgia: Secondary | ICD-10-CM | POA: Insufficient documentation
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
## 2021-02-26 ENCOUNTER — Ambulatory Visit: Payer: 59

## 2021-02-26 ENCOUNTER — Other Ambulatory Visit: Payer: Self-pay

## 2021-02-26 DIAGNOSIS — M25512 Pain in left shoulder: Secondary | ICD-10-CM | POA: Diagnosis not present

## 2021-02-26 DIAGNOSIS — M6281 Muscle weakness (generalized): Secondary | ICD-10-CM
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
## 2021-03-05 ENCOUNTER — Ambulatory Visit: Payer: 59

## 2021-03-05 ENCOUNTER — Other Ambulatory Visit: Payer: Self-pay

## 2021-03-05 DIAGNOSIS — M25512 Pain in left shoulder: Secondary | ICD-10-CM | POA: Diagnosis not present

## 2021-03-05 DIAGNOSIS — M6281 Muscle weakness (generalized): Secondary | ICD-10-CM
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
## 2021-03-10 ENCOUNTER — Other Ambulatory Visit: Payer: Self-pay

## 2021-03-10 ENCOUNTER — Ambulatory Visit: Payer: 59

## 2021-03-10 DIAGNOSIS — M6281 Muscle weakness (generalized): Secondary | ICD-10-CM

## 2021-03-10 DIAGNOSIS — M25512 Pain in left shoulder: Secondary | ICD-10-CM
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
## 2021-03-11 ENCOUNTER — Encounter: Payer: Self-pay | Admitting: Family Medicine

## 2021-03-11 DIAGNOSIS — R7989 Other specified abnormal findings of blood chemistry: Secondary | ICD-10-CM

## 2021-03-11 DIAGNOSIS — E7800 Pure hypercholesterolemia, unspecified: Secondary | ICD-10-CM

## 2021-03-11 DIAGNOSIS — R7301 Impaired fasting glucose: Secondary | ICD-10-CM

## 2021-03-11 DIAGNOSIS — Z1321 Encounter for screening for nutritional disorder: Secondary | ICD-10-CM

## 2021-03-11 NOTE — Telephone Encounter (Signed)
Please see message and advise.  Thank you. ° °

## 2021-03-12 ENCOUNTER — Ambulatory Visit: Payer: 59

## 2021-03-12 ENCOUNTER — Other Ambulatory Visit: Payer: Self-pay

## 2021-03-12 DIAGNOSIS — M25512 Pain in left shoulder: Secondary | ICD-10-CM | POA: Diagnosis not present

## 2021-03-12 DIAGNOSIS — M6281 Muscle weakness (generalized): Secondary | ICD-10-CM
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
## 2021-03-19 ENCOUNTER — Other Ambulatory Visit: Payer: Self-pay

## 2021-03-19 ENCOUNTER — Encounter: Payer: Self-pay | Admitting: Family Medicine

## 2021-03-19 ENCOUNTER — Ambulatory Visit (INDEPENDENT_AMBULATORY_CARE_PROVIDER_SITE_OTHER): Payer: 59 | Admitting: Family Medicine

## 2021-03-19 VITALS — BP 104/72 | HR 88 | Temp 98.1°F | Ht 64.0 in | Wt 190.0 lb

## 2021-03-19 DIAGNOSIS — E7800 Pure hypercholesterolemia, unspecified: Secondary | ICD-10-CM | POA: Diagnosis not present

## 2021-03-19 DIAGNOSIS — S46012A Strain of muscle(s) and tendon(s) of the rotator cuff of left shoulder, initial encounter: Secondary | ICD-10-CM | POA: Diagnosis not present

## 2021-03-19 DIAGNOSIS — Z1321 Encounter for screening for nutritional disorder: Secondary | ICD-10-CM

## 2021-03-19 DIAGNOSIS — M25612 Stiffness of left shoulder, not elsewhere classified: Secondary | ICD-10-CM | POA: Diagnosis not present

## 2021-03-19 DIAGNOSIS — R7301 Impaired fasting glucose: Secondary | ICD-10-CM | POA: Diagnosis not present

## 2021-03-19 DIAGNOSIS — F411 Generalized anxiety disorder: Secondary | ICD-10-CM

## 2021-03-19 DIAGNOSIS — R7989 Other specified abnormal findings of blood chemistry: Secondary | ICD-10-CM | POA: Diagnosis not present

## 2021-03-19 LAB — COMPREHENSIVE METABOLIC PANEL
ALT: 36 U/L — ABNORMAL HIGH (ref 0–35)
AST: 41 U/L — ABNORMAL HIGH (ref 0–37)
Albumin: 4.3 g/dL (ref 3.5–5.2)
Alkaline Phosphatase: 81 U/L (ref 39–117)
BUN: 9 mg/dL (ref 6–23)
CO2: 28 mEq/L (ref 19–32)
Calcium: 9.3 mg/dL (ref 8.4–10.5)
Chloride: 103 mEq/L (ref 96–112)
Creatinine, Ser: 0.71 mg/dL (ref 0.40–1.20)
GFR: 94.44 mL/min (ref >60.00)
Glucose, Bld: 106 mg/dL — ABNORMAL HIGH (ref 70–99)
Potassium: 4.1 mEq/L (ref 3.5–5.1)
Sodium: 140 mEq/L (ref 135–145)
Total Bilirubin: 0.6 mg/dL (ref 0.2–1.2)
Total Protein: 7.2 g/dL (ref 6.0–8.3)

## 2021-03-19 LAB — LIPID PANEL
Cholesterol: 193 mg/dL (ref 0–200)
HDL: 51.1 mg/dL (ref >39.00)
NonHDL: 141.43
Total CHOL/HDL Ratio: 4
Triglycerides: 211 mg/dL — ABNORMAL HIGH (ref 0.0–149.0)
VLDL: 42.2 mg/dL — ABNORMAL HIGH (ref 0.0–40.0)

## 2021-03-19 LAB — CBC
HCT: 38.5 % (ref 36.0–46.0)
Hemoglobin: 13.4 g/dL (ref 12.0–15.0)
MCHC: 34.8 g/dL (ref 30.0–36.0)
MCV: 93.9 fl (ref 78.0–100.0)
Platelets: 258 10*3/uL (ref 150.0–400.0)
RBC: 4.1 Mil/uL (ref 3.87–5.11)
RDW: 13.5 % (ref 11.5–15.5)
WBC: 5.2 10*3/uL (ref 4.0–10.5)

## 2021-03-19 LAB — LDL CHOLESTEROL, DIRECT: Direct LDL: 116 mg/dL

## 2021-03-19 LAB — HEMOGLOBIN A1C: Hgb A1c MFr Bld: 5.9 % (ref 4.6–6.5)

## 2021-03-19 LAB — T4, FREE: Free T4: 0.64 ng/dL (ref 0.60–1.60)

## 2021-03-19 LAB — TSH: TSH: 3.53 u[IU]/mL (ref 0.35–4.50)

## 2021-03-19 LAB — VITAMIN D 25 HYDROXY (VIT D DEFICIENCY, FRACTURES): VITD: 10.66 ng/mL — ABNORMAL LOW (ref 30.00–100.00)

## 2021-03-19 MED ORDER — CITALOPRAM HYDROBROMIDE 10 MG PO TABS: 10.0000 mg | ORAL_TABLET | Freq: Every day | ORAL | 3 refills | Status: DC

## 2021-03-22 ENCOUNTER — Encounter: Payer: Self-pay | Admitting: Family Medicine

## 2021-03-22 DIAGNOSIS — R7401 Elevation of levels of liver transaminase levels: Secondary | ICD-10-CM

## 2021-03-25 ENCOUNTER — Telehealth: Payer: Self-pay | Admitting: Family Medicine

## 2021-03-25 NOTE — Telephone Encounter (Signed)
Pt called back to schedule an appointment and she is wanting to know if it can be a mychart video. Is this ok or do you want an OV?

## 2021-03-25 NOTE — Telephone Encounter (Signed)
Cirigliano, Garvin Fila, DO  [place], [place] Cc: [place], Frutoso Schatz, [place] Burley Saver,  Would you please call this patient and schedule her for a f/u appt in 3-4 wks for anxiety?  Thanks!  Dr. Laqueta Linden and left detailed message to call the office and schedule an appointment per Dr. Loletha Grayer.

## 2021-03-27 ENCOUNTER — Telehealth: Payer: Self-pay | Admitting: Family Medicine

## 2021-03-27 NOTE — Telephone Encounter (Signed)
VV is ok

## 2021-03-27 NOTE — Telephone Encounter (Signed)
Pt is wanting a cb concerning her most recent lab results. Please advise pt at [phone].

## 2021-03-27 NOTE — Telephone Encounter (Signed)
Called patient and left detailed message to call the office and schedule VV.

## 2021-03-27 NOTE — Telephone Encounter (Signed)
Can you please call pt to inform her?  Please and  Thank you.

## 2021-03-28 ENCOUNTER — Other Ambulatory Visit: Payer: Self-pay | Admitting: Family Medicine

## 2021-03-28 DIAGNOSIS — E559 Vitamin D deficiency, unspecified: Secondary | ICD-10-CM

## 2021-03-28 MED ORDER — VITAMIN D (ERGOCALCIFEROL) 1.25 MG (50000 UNIT) PO CAPS: 50000.0000 [IU] | ORAL_CAPSULE | ORAL | 3 refills | Status: DC

## 2021-03-28 NOTE — Telephone Encounter (Signed)
Responded to pts mychart message about this same concern

## 2021-03-31 ENCOUNTER — Ambulatory Visit: Payer: 59 | Attending: Family Medicine

## 2021-03-31 ENCOUNTER — Other Ambulatory Visit: Payer: Self-pay

## 2021-03-31 DIAGNOSIS — M25512 Pain in left shoulder: Secondary | ICD-10-CM | POA: Diagnosis not present

## 2021-03-31 DIAGNOSIS — M542 Cervicalgia: Secondary | ICD-10-CM | POA: Diagnosis present

## 2021-03-31 DIAGNOSIS — M6281 Muscle weakness (generalized): Secondary | ICD-10-CM | POA: Diagnosis present
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
## 2021-04-02 ENCOUNTER — Ambulatory Visit: Payer: 59

## 2021-04-02 ENCOUNTER — Other Ambulatory Visit: Payer: Self-pay

## 2021-04-02 DIAGNOSIS — M25512 Pain in left shoulder: Secondary | ICD-10-CM

## 2021-04-02 DIAGNOSIS — M542 Cervicalgia: Secondary | ICD-10-CM

## 2021-04-02 DIAGNOSIS — M6281 Muscle weakness (generalized): Secondary | ICD-10-CM
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
## 2021-04-03 ENCOUNTER — Telehealth: Payer: Self-pay | Admitting: Family Medicine

## 2021-04-03 NOTE — Telephone Encounter (Signed)
Mrs. Colasurdo called in wanted to know if she can get something that shows that she had labs done on her and what type but no results.  Spoke with Leafy Ro and she stated that we can email the medical release form. Email address: [email or web address]  The fax number is  [phone] its her fax number

## 2021-04-04 NOTE — Telephone Encounter (Signed)
The medical release form has been emailed to patient.

## 2021-04-07 ENCOUNTER — Encounter: Payer: Self-pay | Admitting: Physical Therapy

## 2021-04-07 ENCOUNTER — Other Ambulatory Visit: Payer: Self-pay

## 2021-04-07 ENCOUNTER — Ambulatory Visit: Payer: 59 | Admitting: Physical Therapy

## 2021-04-07 DIAGNOSIS — M542 Cervicalgia: Secondary | ICD-10-CM

## 2021-04-07 DIAGNOSIS — M6281 Muscle weakness (generalized): Secondary | ICD-10-CM

## 2021-04-07 DIAGNOSIS — M25512 Pain in left shoulder: Secondary | ICD-10-CM | POA: Diagnosis not present
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
## 2021-04-09 ENCOUNTER — Encounter: Payer: Self-pay | Admitting: Physical Therapy

## 2021-04-09 ENCOUNTER — Ambulatory Visit: Payer: 59 | Admitting: Physical Therapy

## 2021-04-09 DIAGNOSIS — M6281 Muscle weakness (generalized): Secondary | ICD-10-CM

## 2021-04-09 DIAGNOSIS — M25512 Pain in left shoulder: Secondary | ICD-10-CM | POA: Diagnosis not present

## 2021-04-09 DIAGNOSIS — M542 Cervicalgia: Secondary | ICD-10-CM
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
## 2021-04-14 ENCOUNTER — Ambulatory Visit: Payer: 59 | Admitting: Physical Therapy

## 2021-04-14 ENCOUNTER — Other Ambulatory Visit: Payer: Self-pay

## 2021-04-14 DIAGNOSIS — M6281 Muscle weakness (generalized): Secondary | ICD-10-CM

## 2021-04-14 DIAGNOSIS — M542 Cervicalgia: Secondary | ICD-10-CM

## 2021-04-14 DIAGNOSIS — M25512 Pain in left shoulder: Secondary | ICD-10-CM
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
## 2021-04-16 ENCOUNTER — Encounter: Payer: 59 | Admitting: Physical Therapy

## 2021-04-21 ENCOUNTER — Encounter: Payer: 59 | Admitting: Physical Therapy

## 2021-04-21 ENCOUNTER — Encounter: Payer: Self-pay | Admitting: Physical Therapy

## 2021-04-21 ENCOUNTER — Other Ambulatory Visit: Payer: Self-pay

## 2021-04-21 ENCOUNTER — Ambulatory Visit: Payer: 59 | Admitting: Physical Therapy

## 2021-04-21 DIAGNOSIS — M25512 Pain in left shoulder: Secondary | ICD-10-CM

## 2021-04-21 DIAGNOSIS — M6281 Muscle weakness (generalized): Secondary | ICD-10-CM

## 2021-04-21 DIAGNOSIS — M542 Cervicalgia: Secondary | ICD-10-CM
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
## 2021-04-23 ENCOUNTER — Encounter: Payer: 59 | Admitting: Physical Therapy

## 2021-04-30 ENCOUNTER — Telehealth (INDEPENDENT_AMBULATORY_CARE_PROVIDER_SITE_OTHER): Payer: 59 | Admitting: Family Medicine

## 2021-04-30 ENCOUNTER — Encounter: Payer: Self-pay | Admitting: Family Medicine

## 2021-04-30 VITALS — Ht 64.0 in

## 2021-04-30 DIAGNOSIS — E785 Hyperlipidemia, unspecified: Secondary | ICD-10-CM | POA: Diagnosis not present

## 2021-04-30 DIAGNOSIS — F4310 Post-traumatic stress disorder, unspecified: Secondary | ICD-10-CM | POA: Diagnosis not present

## 2021-04-30 DIAGNOSIS — F411 Generalized anxiety disorder: Secondary | ICD-10-CM | POA: Diagnosis not present

## 2021-04-30 MED ORDER — SIMVASTATIN 20 MG PO TABS: 20.0000 mg | ORAL_TABLET | Freq: Every day | ORAL | 3 refills | Status: DC

## 2021-04-30 MED ORDER — CITALOPRAM HYDROBROMIDE 10 MG PO TABS: 15.0000 mg | ORAL_TABLET | Freq: Every day | ORAL | 3 refills | Status: DC
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
## 2021-05-05 ENCOUNTER — Ambulatory Visit: Payer: 59 | Admitting: Physical Therapy

## 2021-05-06 ENCOUNTER — Telehealth: Payer: Self-pay

## 2021-05-06 MED ORDER — ACYCLOVIR 200 MG PO CAPS: ORAL_CAPSULE | ORAL | 6 refills | Status: DC

## 2021-05-06 NOTE — Telephone Encounter (Signed)
Last VV 04/30/21 Last fill 11/20/19  #60/0

## 2021-05-12 ENCOUNTER — Encounter: Payer: 59 | Admitting: Physical Therapy

## 2021-05-19 ENCOUNTER — Encounter: Payer: 59 | Admitting: Physical Therapy

## 2021-05-21 ENCOUNTER — Encounter: Payer: Self-pay | Admitting: Family Medicine

## 2021-05-21 ENCOUNTER — Ambulatory Visit (INDEPENDENT_AMBULATORY_CARE_PROVIDER_SITE_OTHER): Payer: 59 | Admitting: Family Medicine

## 2021-05-21 ENCOUNTER — Ambulatory Visit: Payer: 59 | Admitting: Family Medicine

## 2021-05-21 ENCOUNTER — Other Ambulatory Visit: Payer: Self-pay

## 2021-05-21 VITALS — BP 110/64 | HR 76 | Temp 97.6°F | Ht 64.0 in | Wt 189.2 lb

## 2021-05-21 DIAGNOSIS — F411 Generalized anxiety disorder: Secondary | ICD-10-CM

## 2021-05-21 DIAGNOSIS — M542 Cervicalgia: Secondary | ICD-10-CM

## 2021-05-21 DIAGNOSIS — M5030 Other cervical disc degeneration, unspecified cervical region: Secondary | ICD-10-CM | POA: Diagnosis not present

## 2021-05-21 DIAGNOSIS — S46012A Strain of muscle(s) and tendon(s) of the rotator cuff of left shoulder, initial encounter: Secondary | ICD-10-CM | POA: Diagnosis not present

## 2021-05-21 MED ORDER — ALBUTEROL SULFATE HFA 108 (90 BASE) MCG/ACT IN AERS: 2.0000 | INHALATION_SPRAY | RESPIRATORY_TRACT | 1 refills | Status: DC | PRN
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
## 2021-05-21 NOTE — Patient Instructions (Signed)
[place]  [place], [place] Tommi Rumps, MD

## 2021-05-22 ENCOUNTER — Ambulatory Visit: Payer: 59 | Admitting: Family Medicine

## 2021-05-26 ENCOUNTER — Encounter: Payer: 59 | Admitting: Physical Therapy

## 2021-06-11 ENCOUNTER — Encounter: Payer: 59 | Admitting: Nurse Practitioner

## 2021-06-18 ENCOUNTER — Ambulatory Visit (INDEPENDENT_AMBULATORY_CARE_PROVIDER_SITE_OTHER): Payer: 59 | Admitting: Nurse Practitioner

## 2021-06-18 ENCOUNTER — Encounter: Payer: Self-pay | Admitting: Nurse Practitioner

## 2021-06-18 ENCOUNTER — Other Ambulatory Visit: Payer: Self-pay

## 2021-06-18 VITALS — BP 122/76 | HR 81 | Temp 97.7°F | Resp 18 | Ht 64.0 in | Wt 188.5 lb

## 2021-06-18 DIAGNOSIS — R748 Abnormal levels of other serum enzymes: Secondary | ICD-10-CM | POA: Diagnosis not present

## 2021-06-18 DIAGNOSIS — Z800 Family history of malignant neoplasm of digestive organs: Secondary | ICD-10-CM

## 2021-06-18 DIAGNOSIS — G43709 Chronic migraine without aura, not intractable, without status migrainosus: Secondary | ICD-10-CM

## 2021-06-18 DIAGNOSIS — R5383 Other fatigue: Secondary | ICD-10-CM

## 2021-06-18 DIAGNOSIS — E559 Vitamin D deficiency, unspecified: Secondary | ICD-10-CM | POA: Diagnosis not present

## 2021-06-18 DIAGNOSIS — F411 Generalized anxiety disorder: Secondary | ICD-10-CM

## 2021-06-18 DIAGNOSIS — E7800 Pure hypercholesterolemia, unspecified: Secondary | ICD-10-CM

## 2021-06-18 DIAGNOSIS — K219 Gastro-esophageal reflux disease without esophagitis: Secondary | ICD-10-CM

## 2021-06-18 DIAGNOSIS — C4490 Unspecified malignant neoplasm of skin, unspecified: Secondary | ICD-10-CM

## 2021-06-18 LAB — COMPREHENSIVE METABOLIC PANEL
ALT: 19 U/L (ref 0–35)
AST: 18 U/L (ref 0–37)
Albumin: 4.1 g/dL (ref 3.5–5.2)
Alkaline Phosphatase: 78 U/L (ref 39–117)
BUN: 16 mg/dL (ref 6–23)
CO2: 27 mEq/L (ref 19–32)
Calcium: 9.5 mg/dL (ref 8.4–10.5)
Chloride: 103 mEq/L (ref 96–112)
Creatinine, Ser: 0.82 mg/dL (ref 0.40–1.20)
GFR: 79.31 mL/min (ref >60.00)
Glucose, Bld: 85 mg/dL (ref 70–99)
Potassium: 4.1 mEq/L (ref 3.5–5.1)
Sodium: 139 mEq/L (ref 135–145)
Total Bilirubin: 0.5 mg/dL (ref 0.2–1.2)
Total Protein: 7.5 g/dL (ref 6.0–8.3)

## 2021-06-18 LAB — CBC
HCT: 39.8 % (ref 36.0–46.0)
Hemoglobin: 13.4 g/dL (ref 12.0–15.0)
MCHC: 33.7 g/dL (ref 30.0–36.0)
MCV: 95.4 fl (ref 78.0–100.0)
Platelets: 277 10*3/uL (ref 150.0–400.0)
RBC: 4.17 Mil/uL (ref 3.87–5.11)
RDW: 13.3 % (ref 11.5–15.5)
WBC: 5.4 10*3/uL (ref 4.0–10.5)

## 2021-06-18 LAB — VITAMIN D 25 HYDROXY (VIT D DEFICIENCY, FRACTURES): VITD: 42.39 ng/mL (ref 30.00–100.00)
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
## 2021-06-18 NOTE — Assessment & Plan Note (Signed)
Patient states large improvement since her divorce.  States she is to have up to 15 a month.  States she has headaches now but migraines are infrequent.  Her migraines do consist of starting in the back of the head moving to the front of the head or just being in the front of the head.  She does have nausea, vomiting, photosensitivity, phonosensitivity.  Not on preventative treatment but does use abortive therapy.  Currently maintained on promethazine and sumatriptan.  Continue medication as listed above.

## 2021-06-18 NOTE — Assessment & Plan Note (Signed)
States have strong history of colon cancers.  Has been getting regular colonoscopies every 5 years.  States she is overdue about 1 year currently.  Will contact GI to get set up.

## 2021-06-18 NOTE — Telephone Encounter (Signed)
I still see that Vitamin D is on her current medication list. Not sure if it was noted somewhere else in the note maybe?

## 2021-06-18 NOTE — Assessment & Plan Note (Signed)
Currently maintained on Zocor.  Last lipids fairly controlled.  LDL still slightly above goal.  Continue Zocor.

## 2021-06-18 NOTE — Assessment & Plan Note (Signed)
Well-controlled on Protonix.  She is followed by GI for strong family history of colon cancer.  States she has done diet modifications that seem to help.  Continue Protonix for GERD and follow-up with GI as scheduled.
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
## 2021-06-18 NOTE — Assessment & Plan Note (Signed)
History of melanoma x2 per patient report.  States she has established with dermatology.  States she has a skin survey every year.  Continue follow-up with dermatology as directed

## 2021-06-18 NOTE — Patient Instructions (Signed)
Was nice to see you today. Will see you in approx 6 months. Sooner if you need me Will be in touch regarding your labs

## 2021-06-18 NOTE — Assessment & Plan Note (Signed)
Have mild elevation of 2 liver functions on recent lab results in May 2022.  We will recheck today to see if elevated still, increasing, or normalized.

## 2021-06-18 NOTE — Assessment & Plan Note (Addendum)
History of the same.  Currently on vitamin D 50,000 IUs once weekly.   Pending results.

## 2021-08-04 ENCOUNTER — Encounter: Payer: Self-pay | Admitting: Nurse Practitioner

## 2021-08-04 MED ORDER — ALBUTEROL SULFATE HFA 108 (90 BASE) MCG/ACT IN AERS: 2.0000 | INHALATION_SPRAY | RESPIRATORY_TRACT | 1 refills | Status: DC | PRN

## 2021-09-02 ENCOUNTER — Other Ambulatory Visit: Payer: Self-pay | Admitting: Obstetrics and Gynecology

## 2021-09-02 DIAGNOSIS — Z1231 Encounter for screening mammogram for malignant neoplasm of breast: Secondary | ICD-10-CM

## 2021-09-27 ENCOUNTER — Other Ambulatory Visit: Payer: Self-pay | Admitting: Gastroenterology

## 2021-10-02 ENCOUNTER — Telehealth: Payer: Self-pay | Admitting: Gastroenterology

## 2021-10-02 ENCOUNTER — Other Ambulatory Visit: Payer: Self-pay

## 2021-10-02 MED ORDER — PANTOPRAZOLE SODIUM 40 MG PO TBEC: 40.0000 mg | DELAYED_RELEASE_TABLET | Freq: Every day | ORAL | 0 refills | Status: DC

## 2021-10-02 NOTE — Telephone Encounter (Signed)
Inbound call from pt requesting a refill for pantoprazole. Pt is out of her medication.

## 2021-10-02 NOTE — Telephone Encounter (Signed)
Pt has been notified about the med refill and also she has been scheduled as well

## 2021-10-22 ENCOUNTER — Ambulatory Visit: Payer: 59

## 2021-10-23 ENCOUNTER — Ambulatory Visit
Admission: RE | Admit: 2021-10-23 | Discharge: 2021-10-23 | Disposition: A | Discharge status: Home / Self Care | Payer: 59 | Source: Ambulatory Visit | Attending: Obstetrics and Gynecology | Admitting: Obstetrics and Gynecology

## 2021-10-23 ENCOUNTER — Other Ambulatory Visit: Payer: Self-pay

## 2021-10-23 DIAGNOSIS — Z1231 Encounter for screening mammogram for malignant neoplasm of breast: Secondary | ICD-10-CM

## 2021-11-04 ENCOUNTER — Other Ambulatory Visit: Payer: Self-pay | Admitting: Gastroenterology

## 2021-11-04 ENCOUNTER — Other Ambulatory Visit: Payer: Self-pay

## 2021-11-04 MED ORDER — PANTOPRAZOLE SODIUM 40 MG PO TBEC: 40.0000 mg | DELAYED_RELEASE_TABLET | Freq: Every day | ORAL | 0 refills | Status: DC

## 2021-11-20 ENCOUNTER — Telehealth: Payer: Self-pay

## 2021-11-20 ENCOUNTER — Ambulatory Visit (INDEPENDENT_AMBULATORY_CARE_PROVIDER_SITE_OTHER): Payer: Managed Care, Other (non HMO) | Admitting: Gastroenterology

## 2021-11-20 ENCOUNTER — Other Ambulatory Visit: Payer: Self-pay

## 2021-11-20 ENCOUNTER — Encounter: Payer: Self-pay | Admitting: Gastroenterology

## 2021-11-20 VITALS — BP 125/77 | HR 106 | Temp 98.7°F | Wt 195.0 lb

## 2021-11-20 DIAGNOSIS — K581 Irritable bowel syndrome with constipation: Secondary | ICD-10-CM | POA: Diagnosis not present

## 2021-11-20 DIAGNOSIS — K219 Gastro-esophageal reflux disease without esophagitis: Secondary | ICD-10-CM

## 2021-11-20 DIAGNOSIS — Z800 Family history of malignant neoplasm of digestive organs: Secondary | ICD-10-CM | POA: Diagnosis not present

## 2021-11-20 MED ORDER — PANTOPRAZOLE SODIUM 40 MG PO TBEC: 40.0000 mg | DELAYED_RELEASE_TABLET | Freq: Every day | ORAL | 0 refills | Status: DC

## 2021-11-20 MED ORDER — LINACLOTIDE 145 MCG PO CAPS: 145.0000 ug | ORAL_CAPSULE | Freq: Every day | ORAL | 3 refills | Status: DC

## 2021-11-20 MED ORDER — PANTOPRAZOLE SODIUM 40 MG PO TBEC: 40.0000 mg | DELAYED_RELEASE_TABLET | Freq: Every day | ORAL | 11 refills | Status: DC

## 2021-11-20 MED ORDER — NA SULFATE-K SULFATE-MG SULF 17.5-3.13-1.6 GM/177ML PO SOLN: 1.0000 | Freq: Once | ORAL | 0 refills | Status: AC
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
## 2021-11-20 NOTE — Addendum Note (Signed)
Addended by: Lurlean Nanny on: 11/20/2021 05:25 PM   Modules accepted: Orders

## 2021-11-20 NOTE — Addendum Note (Signed)
Addended by: Lurlean Nanny on: 11/20/2021 05:15 PM   Modules accepted: Orders

## 2021-11-20 NOTE — Telephone Encounter (Signed)
-----   Message from Eliseo Squires, [place] sent at 11/20/2021  4:06 PM EST ----- Patient states medicine is 3112 a month at her pharmacy is there anything else she can use cheaper
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
## 2021-11-21 NOTE — Telephone Encounter (Signed)
I called the pharmacy and they stated that a prior auth is not needed... pt just has a very high deductible that has to be met before insurance will kick in for coverage... I am giving her 6 boxes of samples for her to try in the meantime...  What else can pt take, as I am sure Trulance would be even more expensive? Please advise

## 2021-11-25 MED ORDER — LUBIPROSTONE 24 MCG PO CAPS: 24.0000 ug | ORAL_CAPSULE | Freq: Two times a day (BID) | ORAL | 3 refills | Status: DC

## 2021-11-25 NOTE — Addendum Note (Signed)
Addended by: Lurlean Nanny on: 11/25/2021 09:49 AM   Modules accepted: Orders

## 2021-11-25 NOTE — Telephone Encounter (Signed)
Amitizia 24 BID sent to pharmacy and pt is aware as instructed   Linzess d/c from med list due to cost

## 2021-12-09 ENCOUNTER — Encounter
Admission: RE | Disposition: A | Discharge status: Home / Self Care | Payer: Self-pay | Source: Home / Self Care | Attending: Gastroenterology

## 2021-12-09 ENCOUNTER — Encounter: Payer: Self-pay | Admitting: Gastroenterology

## 2021-12-09 ENCOUNTER — Ambulatory Visit: Payer: Commercial Managed Care - HMO | Admitting: Registered Nurse

## 2021-12-09 ENCOUNTER — Ambulatory Visit
Admission: RE | Admit: 2021-12-09 | Discharge: 2021-12-09 | Disposition: A | Discharge status: Home / Self Care | Payer: Commercial Managed Care - HMO | Attending: Gastroenterology | Admitting: Gastroenterology

## 2021-12-09 DIAGNOSIS — K589 Irritable bowel syndrome without diarrhea: Secondary | ICD-10-CM | POA: Diagnosis not present

## 2021-12-09 DIAGNOSIS — Z87891 Personal history of nicotine dependence: Secondary | ICD-10-CM | POA: Insufficient documentation

## 2021-12-09 DIAGNOSIS — K5730 Diverticulosis of large intestine without perforation or abscess without bleeding: Secondary | ICD-10-CM | POA: Diagnosis not present

## 2021-12-09 DIAGNOSIS — Z79899 Other long term (current) drug therapy: Secondary | ICD-10-CM | POA: Diagnosis not present

## 2021-12-09 DIAGNOSIS — Z800 Family history of malignant neoplasm of digestive organs: Secondary | ICD-10-CM | POA: Diagnosis not present

## 2021-12-09 DIAGNOSIS — K641 Second degree hemorrhoids: Secondary | ICD-10-CM | POA: Insufficient documentation

## 2021-12-09 DIAGNOSIS — D126 Benign neoplasm of colon, unspecified: Secondary | ICD-10-CM | POA: Insufficient documentation

## 2021-12-09 DIAGNOSIS — Z86010 Personal history of colonic polyps: Secondary | ICD-10-CM | POA: Insufficient documentation

## 2021-12-09 DIAGNOSIS — K635 Polyp of colon: Secondary | ICD-10-CM | POA: Diagnosis not present

## 2021-12-09 DIAGNOSIS — K219 Gastro-esophageal reflux disease without esophagitis: Secondary | ICD-10-CM | POA: Insufficient documentation

## 2021-12-09 DIAGNOSIS — Z1211 Encounter for screening for malignant neoplasm of colon: Secondary | ICD-10-CM | POA: Diagnosis present

## 2021-12-09 DIAGNOSIS — F419 Anxiety disorder, unspecified: Secondary | ICD-10-CM | POA: Insufficient documentation

## 2021-12-09 HISTORY — PX: COLONOSCOPY WITH PROPOFOL: SHX5780

## 2021-12-09 SURGERY — COLONOSCOPY WITH PROPOFOL
Anesthesia: General

## 2021-12-09 MED ORDER — SODIUM CHLORIDE 0.9 % IV SOLN: INTRAVENOUS | Status: DC

## 2021-12-09 MED ORDER — LIDOCAINE HCL (CARDIAC) PF 100 MG/5ML IV SOSY
PREFILLED_SYRINGE | INTRAVENOUS | Status: DC | PRN
  Administered 2021-12-09: 100 mg via INTRAVENOUS

## 2021-12-09 MED ORDER — PROPOFOL 500 MG/50ML IV EMUL
INTRAVENOUS | Status: DC | PRN
  Administered 2021-12-09: 150 ug/kg/min via INTRAVENOUS

## 2021-12-09 MED ORDER — GLYCOPYRROLATE 0.2 MG/ML IJ SOLN
INTRAMUSCULAR | Status: AC
  Filled 2021-12-09: qty 1

## 2021-12-09 MED ORDER — SODIUM CHLORIDE 0.9 % IV SOLN: INTRAVENOUS | Status: DC | PRN

## 2021-12-09 MED ORDER — LUBIPROSTONE 24 MCG PO CAPS: 24.0000 ug | ORAL_CAPSULE | Freq: Two times a day (BID) | ORAL | 3 refills | Status: DC

## 2021-12-09 MED ORDER — PROPOFOL 500 MG/50ML IV EMUL
INTRAVENOUS | Status: AC
  Filled 2021-12-09: qty 350

## 2021-12-09 MED ORDER — LIDOCAINE HCL (PF) 2 % IJ SOLN
INTRAMUSCULAR | Status: AC
  Filled 2021-12-09: qty 35

## 2021-12-09 MED ORDER — DEXMEDETOMIDINE HCL IN NACL 80 MCG/20ML IV SOLN
INTRAVENOUS | Status: AC
  Filled 2021-12-09: qty 20

## 2021-12-09 MED ORDER — PROPOFOL 10 MG/ML IV BOLUS
INTRAVENOUS | Status: DC | PRN
  Administered 2021-12-09: 20 mg via INTRAVENOUS
  Administered 2021-12-09: 80 mg via INTRAVENOUS
  Administered 2021-12-09: 20 mg via INTRAVENOUS

## 2021-12-09 NOTE — Anesthesia Postprocedure Evaluation (Signed)
Anesthesia Post Note  Patient: Danielle Strickland  Procedure(s) Performed: COLONOSCOPY WITH PROPOFOL  Patient location during evaluation: Endoscopy Anesthesia Type: General Level of consciousness: awake and alert Pain management: pain level controlled Vital Signs Assessment: post-procedure vital signs reviewed and stable Respiratory status: spontaneous breathing, nonlabored ventilation and respiratory function stable Cardiovascular status: blood pressure returned to baseline and stable Postop Assessment: no apparent nausea or vomiting Anesthetic complications: no   No notable events documented.   Last Vitals:  Vitals:   12/09/21 0830 12/09/21 0840  BP: (!) 102/55 (!) 110/57  Pulse: 85 85  Resp: 14 17  Temp:    SpO2: 99% 100%    Last Pain:  Vitals:   12/09/21 0725  TempSrc: Temporal  PainSc: 0-No pain                 [place] Ouch

## 2021-12-09 NOTE — Anesthesia Preprocedure Evaluation (Addendum)
Anesthesia Evaluation  Patient identified by MRN, date of birth, ID band Patient awake    Reviewed: Allergy & Precautions, NPO status , Patient's Chart, lab work & pertinent test results  Airway Mallampati: II  TM Distance: >3 FB Neck ROM: full    Dental  (+) Missing,    Pulmonary shortness of breath and with exertion, former smoker,  Moderated risk for OSA   Pulmonary exam normal        Cardiovascular Exercise Tolerance: Good + DOE  Normal cardiovascular exam     Neuro/Psych  Headaches, PSYCHIATRIC DISORDERS Anxiety PTSD Neuromuscular disease (CRPS RLE)    GI/Hepatic GERD  Controlled and Medicated,(+) Hepatitis - (history at age 35)  Endo/Other  negative endocrine ROS  Renal/GU negative Renal ROS  negative genitourinary   Musculoskeletal  (+) Arthritis ,   Abdominal   Peds  Hematology negative hematology ROS (+)   Anesthesia Other Findings Past Medical History: No date: Abnormal Pap smear of vagina and vaginal HPV     Comment:  ascus  No date: Allergy No date: Endometriosis No date: GERD (gastroesophageal reflux disease) No date: Herpes simplex without mention of complication     Comment:  HSV-2 No date: History of migraine No date: HPV in female No date: IBS (irritable bowel syndrome) No date: Liver disease     Comment:  H/O hepatitis No date: Mental disorder     Comment:  anxiety No date: Microscopic hematuria No date: Migraines No date: Peptic ulcer disease No date: Skin cancer     Comment:  left thigh 02/21/2013: Tobacco abuse  Past Surgical History: No date: ABDOMINAL HYSTERECTOMY No date: APPENDECTOMY 11/26/2015: CHOLECYSTECTOMY; N/A     Comment:  Procedure: LAPAROSCOPIC CHOLECYSTECTOMY ;  Surgeon:               Jules Husbands, MD;  Location: [facility] ORS;  Service:               General;  Laterality: N/A; 2006: laparoscopically assisted vaginal hysterectomy No date: SALIVARY GLAND SURGERY No  date: submandular No date: TUBAL LIGATION No date: WISDOM TOOTH EXTRACTION     Reproductive/Obstetrics negative OB ROS                            Anesthesia Physical Anesthesia Plan  ASA: 2  Anesthesia Plan: General   Post-op Pain Management:    Induction: Intravenous  PONV Risk Score and Plan: Propofol infusion and TIVA  Airway Management Planned: Natural Airway and Simple Face Mask  Additional Equipment:   Intra-op Plan:   Post-operative Plan:   Informed Consent: I have reviewed the patients History and Physical, chart, labs and discussed the procedure including the risks, benefits and alternatives for the proposed anesthesia with the patient or authorized representative who has indicated his/her understanding and acceptance.     Dental advisory given  Plan Discussed with: Anesthesiologist, CRNA and Surgeon  Anesthesia Plan Comments:        Anesthesia Quick Evaluation

## 2021-12-09 NOTE — Interval H&P Note (Signed)
Lucilla Lame, MD [facility] [address], [place], [place] [address] Phone:[phone] Fax : [phone]  Primary Care Physician:  Michela Pitcher, NP Primary Gastroenterologist:  Dr. Allen Norris  Pre-Procedure History & Physical: HPI:  RAHIMA FLEISHMAN is a 56 y.o. female is here for an colonoscopy.   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   History of migraine    HPV in female    IBS (irritable bowel syndrome)    Liver disease    H/O hepatitis   Mental disorder    anxiety   Microscopic hematuria    Migraines    Peptic ulcer disease    Skin cancer    left thigh   Tobacco abuse 02/21/2013    Past Surgical History:  Procedure Laterality Date   ABDOMINAL HYSTERECTOMY     APPENDECTOMY     CHOLECYSTECTOMY N/A 11/26/2015   Procedure: LAPAROSCOPIC CHOLECYSTECTOMY ;  Surgeon: Jules Husbands, MD;  Location: [facility] ORS;  Service: General;  Laterality: N/A;   laparoscopically assisted vaginal hysterectomy  2006   SALIVARY GLAND SURGERY     submandular     TUBAL LIGATION     WISDOM TOOTH EXTRACTION      Prior to Admission medications   Medication Sig Start Date End Date Taking? Authorizing Provider  citalopram (CELEXA) 10 MG tablet Take 1.5 tablets (15 mg total) by mouth daily. 04/30/21  Yes Cirigliano, Mary K, DO  simvastatin (ZOCOR) 20 MG tablet Take 1 tablet (20 mg total) by mouth at bedtime. 04/30/21  Yes Cirigliano, Mary K, DO  acyclovir (ZOVIRAX) 200 MG capsule TAKE 2 CAPSULES BY MOUTH 2 TO 3 TIMES DAILY FOR 5 DAYS AS NEEDED 05/06/21   Cirigliano, Mary K, DO  albuterol (VENTOLIN HFA) 108 (90 Base) MCG/ACT inhaler Inhale 2 puffs into the lungs every 4 (four) hours as needed for wheezing or shortness of breath. 08/04/21   Michela Pitcher, NP  cyclobenzaprine (FLEXERIL) 10 MG tablet Take 1 tablet (10  mg total) by mouth 3 (three) times daily as needed for muscle spasms. 12/03/20   Haydee Salter, MD  fexofenadine (ALLEGRA) 180 MG tablet Take 180 mg by mouth daily. Over the counter.    [provider]  lubiprostone (AMITIZA) 24 MCG capsule Take 1 capsule (24 mcg total) by mouth 2 (two) times daily with a meal. 11/25/21   Lucilla Lame, MD  meloxicam (MOBIC) 7.5 MG tablet SMARTSIG:1 Tablet(s) By Mouth Every 12 Hours 02/24/21   [provider]  pantoprazole (PROTONIX) 40 MG tablet Take 1 tablet (40 mg total) by mouth daily. 11/20/21   Lucilla Lame, MD  promethazine (PHENERGAN) 25 MG tablet Take 1q8h prn N&V with Migraines 01/29/21   Cirigliano, Garvin Fila, DO  SUMAtriptan (IMITREX) 50 MG tablet Take 1 tablet (50 mg total) by mouth every 2 (two) hours as needed for migraine. 01/29/21   Cirigliano, Garvin Fila, DO  triamcinolone ointment (KENALOG) 0.1 % triamcinolone acetonide 0.1 % topical ointment    [provider]  Vitamin D, Ergocalciferol, (DRISDOL) 1.25 MG (50000 UNIT) CAPS capsule Take 1 capsule (50,000 Units total) by mouth every 7 (seven) days. 03/28/21   Ronnald Nian, DO    Allergies as of 11/20/2021 - Review Complete 11/20/2021  Allergen  Reaction Noted   Amoxicillin-pot clavulanate Nausea And Vomiting and Other (See Comments) 09/07/2012   Latex Rash 09/07/2012   Aspirin Nausea And Vomiting 11/25/2015   Neosporin [neomycin-bacitracin zn-polymyx]  09/24/2016    Family History  Problem Relation Age of Onset   Cancer Mother        colon    Hyperlipidemia Sister    Thyroid cancer Maternal Aunt    Breast cancer Maternal Aunt    Cancer Paternal Aunt        colon   Breast cancer Maternal Grandmother     Social History   Socioeconomic History   Marital status: Divorced    Spouse name: Not on file   Number of children: Not on file   Years of education: Not on file   Highest education level: Not on file  Occupational History   Not on file  Tobacco Use   Smoking  status: Former    Packs/day: 1.00    Types: Cigarettes    Quit date: 03/03/2015    Years since quitting: 6.7   Smokeless tobacco: Never   Tobacco comments:    Quit May 8  Substance and Sexual Activity   Alcohol use: No    Alcohol/week: 0.0 standard drinks   Drug use: No   Sexual activity: Not Currently    Birth control/protection: Surgical    Comment: hyst/BTL   Other Topics Concern   Not on file  Social History Narrative   [phone]   Married to fourth husband   Social Determinants of Health   Financial Resource Strain: Not on file  Food Insecurity: Not on file  Transportation Needs: Not on file  Physical Activity: Not on file  Stress: Not on file  Social Connections: Not on file  Intimate Partner Violence: Not on file    Review of Systems: See HPI, otherwise negative ROS  Physical Exam: BP 138/75    Pulse 90    Temp 97.9 F (36.6 C) (Temporal)    Resp 16    Ht 5\' 4"  (1.626 m)    Wt 85.7 kg    SpO2 96%    BMI 32.44 kg/m  General:   Alert,  pleasant and cooperative in NAD Head:  Normocephalic and atraumatic. Neck:  Supple; no masses or thyromegaly. Lungs:  Clear throughout to auscultation.    Heart:  Regular rate and rhythm. Abdomen:  Soft, nontender and nondistended. Normal bowel sounds, without guarding, and without rebound.   Neurologic:  Alert and  oriented x4;  grossly normal neurologically.  Impression/Plan: [name] is here for an colonoscopy to be performed for family history of colon cancer in her mother at 36  Risks, benefits, limitations, and alternatives regarding  colonoscopy have been reviewed with the patient.  Questions have been answered.  All parties agreeable.   Lucilla Lame, MD  12/09/2021, 7:40 AM

## 2021-12-09 NOTE — Op Note (Signed)
[facility] Patient Name: Danielle Strickland Procedure Date: 12/09/2021 7:49 AM MRN: [id number] Account #: [id number] Date of Birth: [birth year] Admit Type: Outpatient Age: 56 Room: [facility] ENDO ROOM 4 Gender: Female Note Status: Finalized Instrument Name: Jasper Riling [id number] Procedure:             Colonoscopy Indications:           Family history of colon cancer in a first-degree                         relative before age 55 years Providers:             Lucilla Lame MD, MD Referring MD:          Alyson Locket. Cable (Referring MD) Medicines:             Propofol per Anesthesia Complications:         No immediate complications. Procedure:             Pre-Anesthesia Assessment:                        - Prior to the procedure, a History and Physical was                         performed, and patient medications and allergies were                         reviewed. The patient's tolerance of previous                         anesthesia was also reviewed. The risks and benefits                         of the procedure and the sedation options and risks                         were discussed with the patient. All questions were                         answered, and informed consent was obtained. Prior                         Anticoagulants: The patient has taken no previous                         anticoagulant or antiplatelet agents. ASA Grade                         Assessment: II - A patient with mild systemic disease.                         After reviewing the risks and benefits, the patient                         was deemed in satisfactory condition to undergo the                         procedure.  After obtaining informed consent, the colonoscope was                         passed under direct vision. Throughout the procedure,                         the patient's blood pressure, pulse, and oxygen                         saturations  were monitored continuously. The                         Colonoscope was introduced through the anus and                         advanced to the the cecum, identified by appendiceal                         orifice and ileocecal valve. The colonoscopy was                         performed without difficulty. The patient tolerated                         the procedure well. The quality of the bowel                         preparation was excellent. Findings:      The perianal and digital rectal examinations were normal.      A 4 mm polyp was found in the sigmoid colon. The polyp was sessile. The       polyp was removed with a cold snare. Resection and retrieval were       complete.      Multiple small-mouthed diverticula were found in the sigmoid colon.      Non-bleeding internal hemorrhoids were found during retroflexion. The       hemorrhoids were Grade II (internal hemorrhoids that prolapse but reduce       spontaneously). Impression:            - One 4 mm polyp in the sigmoid colon, removed with a                         cold snare. Resected and retrieved.                        - Diverticulosis in the sigmoid colon.                        - Non-bleeding internal hemorrhoids. Recommendation:        - Discharge patient to home.                        - Resume previous diet.                        - Continue present medications.                        - Await pathology results.                        -  Repeat colonoscopy in 5 years for surveillance. Procedure Code(s):     --- Professional ---                        [phone], Colonoscopy, flexible; with removal of                         tumor(s), polyp(s), or other lesion(s) by snare                         technique Diagnosis Code(s):     --- Professional ---                        Z80.0, Family history of malignant neoplasm of                         digestive organs                        K63.5, Polyp of colon CPT copyright 2019  American Medical Association. All rights reserved. The codes documented in this report are preliminary and upon coder review may  be revised to meet current compliance requirements. Lucilla Lame MD, MD 12/09/2021 8:09:23 AM This report has been signed electronically. Number of Addenda: 0 Note Initiated On: 12/09/2021 7:49 AM Scope Withdrawal Time: 0 hours 5 minutes 37 seconds  Total Procedure Duration: 0 hours 11 minutes 7 seconds  Estimated Blood Loss:  Estimated blood loss: none.      [facility]

## 2021-12-09 NOTE — Transfer of Care (Signed)
Immediate Anesthesia Transfer of Care Note  Patient: [name]  Procedure(s) Performed: COLONOSCOPY WITH PROPOFOL  Patient Location: Endoscopy Unit  Anesthesia Type:General  Level of Consciousness: drowsy  Airway & Oxygen Therapy: Patient Spontanous Breathing  Post-op Assessment: Report given to RN and Post -op Vital signs reviewed and stable  Post vital signs: Reviewed and stable  Last Vitals:  Vitals Value Taken Time  BP 87/42 12/09/21 0810  Temp    Pulse 89 12/09/21 0810  Resp 11 12/09/21 0810  SpO2 96 % 12/09/21 0810    Last Pain:  Vitals:   12/09/21 0725  TempSrc: Temporal  PainSc: 0-No pain         Complications: No notable events documented.

## 2021-12-10 ENCOUNTER — Encounter: Payer: Self-pay | Admitting: Gastroenterology

## 2021-12-10 LAB — SURGICAL PATHOLOGY

## 2021-12-31 ENCOUNTER — Ambulatory Visit (INDEPENDENT_AMBULATORY_CARE_PROVIDER_SITE_OTHER): Payer: Managed Care, Other (non HMO) | Admitting: Nurse Practitioner

## 2021-12-31 ENCOUNTER — Other Ambulatory Visit: Payer: Self-pay

## 2021-12-31 ENCOUNTER — Encounter: Payer: Self-pay | Admitting: Nurse Practitioner

## 2021-12-31 ENCOUNTER — Telehealth: Payer: Self-pay | Admitting: *Deleted

## 2021-12-31 VITALS — BP 108/72 | HR 92 | Temp 97.5°F | Resp 12 | Ht 63.5 in | Wt 190.4 lb

## 2021-12-31 DIAGNOSIS — F419 Anxiety disorder, unspecified: Secondary | ICD-10-CM

## 2021-12-31 DIAGNOSIS — A6000 Herpesviral infection of urogenital system, unspecified: Secondary | ICD-10-CM | POA: Insufficient documentation

## 2021-12-31 DIAGNOSIS — R5383 Other fatigue: Secondary | ICD-10-CM | POA: Insufficient documentation

## 2021-12-31 DIAGNOSIS — Z0000 Encounter for general adult medical examination without abnormal findings: Secondary | ICD-10-CM

## 2021-12-31 DIAGNOSIS — R7301 Impaired fasting glucose: Secondary | ICD-10-CM

## 2021-12-31 DIAGNOSIS — F411 Generalized anxiety disorder: Secondary | ICD-10-CM

## 2021-12-31 DIAGNOSIS — C4490 Unspecified malignant neoplasm of skin, unspecified: Secondary | ICD-10-CM

## 2021-12-31 DIAGNOSIS — E7800 Pure hypercholesterolemia, unspecified: Secondary | ICD-10-CM

## 2021-12-31 DIAGNOSIS — G43709 Chronic migraine without aura, not intractable, without status migrainosus: Secondary | ICD-10-CM

## 2021-12-31 DIAGNOSIS — Z122 Encounter for screening for malignant neoplasm of respiratory organs: Secondary | ICD-10-CM

## 2021-12-31 DIAGNOSIS — I700 Atherosclerosis of aorta: Secondary | ICD-10-CM

## 2021-12-31 LAB — COMPREHENSIVE METABOLIC PANEL
ALT: 16 U/L (ref 0–35)
AST: 17 U/L (ref 0–37)
Albumin: 4.6 g/dL (ref 3.5–5.2)
Alkaline Phosphatase: 92 U/L (ref 39–117)
BUN: 13 mg/dL (ref 6–23)
CO2: 27 mEq/L (ref 19–32)
Calcium: 9.7 mg/dL (ref 8.4–10.5)
Chloride: 102 mEq/L (ref 96–112)
Creatinine, Ser: 0.73 mg/dL (ref 0.40–1.20)
GFR: 90.84 mL/min (ref >60.00)
Glucose, Bld: 98 mg/dL (ref 70–99)
Potassium: 4.3 mEq/L (ref 3.5–5.1)
Sodium: 139 mEq/L (ref 135–145)
Total Bilirubin: 0.6 mg/dL (ref 0.2–1.2)
Total Protein: 7.8 g/dL (ref 6.0–8.3)

## 2021-12-31 LAB — LIPID PANEL
Cholesterol: 194 mg/dL (ref 0–200)
HDL: 48.1 mg/dL (ref >39.00)
LDL Cholesterol: 110 mg/dL — ABNORMAL HIGH (ref 0–99)
NonHDL: 145.78
Total CHOL/HDL Ratio: 4
Triglycerides: 179 mg/dL — ABNORMAL HIGH (ref 0.0–149.0)
VLDL: 35.8 mg/dL (ref 0.0–40.0)

## 2021-12-31 LAB — CBC WITH DIFFERENTIAL/PLATELET
Basophils Absolute: 0 10*3/uL (ref 0.0–0.1)
Basophils Relative: 0.5 % (ref 0.0–3.0)
Eosinophils Absolute: 0.1 10*3/uL (ref 0.0–0.7)
Eosinophils Relative: 1.5 % (ref 0.0–5.0)
HCT: 39.4 % (ref 36.0–46.0)
Hemoglobin: 13.5 g/dL (ref 12.0–15.0)
Lymphocytes Relative: 24 % (ref 12.0–46.0)
Lymphs Abs: 1.7 10*3/uL (ref 0.7–4.0)
MCHC: 34.3 g/dL (ref 30.0–36.0)
MCV: 94.7 fl (ref 78.0–100.0)
Monocytes Absolute: 0.5 10*3/uL (ref 0.1–1.0)
Monocytes Relative: 7.1 % (ref 3.0–12.0)
Neutro Abs: 4.8 10*3/uL (ref 1.4–7.7)
Neutrophils Relative %: 66.9 % (ref 43.0–77.0)
Platelets: 303 10*3/uL (ref 150.0–400.0)
RBC: 4.16 Mil/uL (ref 3.87–5.11)
RDW: 12.9 % (ref 11.5–15.5)
WBC: 7.1 10*3/uL (ref 4.0–10.5)

## 2021-12-31 LAB — HEMOGLOBIN A1C: Hgb A1c MFr Bld: 5.8 % (ref 4.6–6.5)

## 2021-12-31 LAB — TSH: TSH: 6.4 u[IU]/mL — ABNORMAL HIGH (ref 0.35–5.50)

## 2021-12-31 MED ORDER — ALBUTEROL SULFATE HFA 108 (90 BASE) MCG/ACT IN AERS: 2.0000 | INHALATION_SPRAY | RESPIRATORY_TRACT | 1 refills | Status: DC | PRN

## 2021-12-31 MED ORDER — SUMATRIPTAN SUCCINATE 50 MG PO TABS: 50.0000 mg | ORAL_TABLET | ORAL | 2 refills | Status: DC | PRN

## 2021-12-31 MED ORDER — PROMETHAZINE HCL 25 MG PO TABS: ORAL_TABLET | ORAL | 2 refills | Status: DC

## 2021-12-31 MED ORDER — ACYCLOVIR 200 MG PO CAPS: ORAL_CAPSULE | ORAL | 6 refills | Status: DC

## 2021-12-31 NOTE — Assessment & Plan Note (Signed)
Cervical diagnosis take valacyclovir throughout the year as needed.  Continue medication ?

## 2021-12-31 NOTE — Assessment & Plan Note (Signed)
History of the same pending A1c today. ?
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
## 2021-12-31 NOTE — Patient Instructions (Signed)
Nice to see you today ?Work on exercise we would like you to get 30 mins 5 times a week ?Start taking 5 mg of the citalopram and do a virtual visit with me in 6 weeks ?I will be in touch in regards to the lab results ? ?

## 2021-12-31 NOTE — Assessment & Plan Note (Signed)
Stable does use abortive therapy as needed 3-4 migraines a month currently.  Continue medications follow-up as needed ?

## 2021-12-31 NOTE — Assessment & Plan Note (Signed)
History of skin cancer.  Does see dermatology yearly for skin surveys continue following up with dermatology as recommended ?

## 2021-12-31 NOTE — Assessment & Plan Note (Signed)
Renal CT scan.  Did encourage healthy lifestyle modifications along with exercise patient currently on statin therapy ?

## 2021-12-31 NOTE — Telephone Encounter (Signed)
Patient advised that referral to Pulmologist office was put in and that is who will set up this scan and go over pre questions for this test. ?

## 2021-12-31 NOTE — Telephone Encounter (Signed)
Pt came for her lab work and asked if PCP ordered the low dose lung CT she said they talked about it but it didn't look like it was ordered, please advised, I told pt I  would send this message and they will f/u with her ?

## 2021-12-31 NOTE — Telephone Encounter (Signed)
Order placed and should be visible in chart now ?

## 2021-12-31 NOTE — Assessment & Plan Note (Signed)
Encouraged healthy lifestyle modifications continue statin therapy. ?

## 2021-12-31 NOTE — Assessment & Plan Note (Signed)
Ambulatory referral to lung cancer screening service through [facility] pulmonology. ?

## 2022-01-01 ENCOUNTER — Other Ambulatory Visit (INDEPENDENT_AMBULATORY_CARE_PROVIDER_SITE_OTHER): Payer: Managed Care, Other (non HMO)

## 2022-01-01 DIAGNOSIS — R7989 Other specified abnormal findings of blood chemistry: Secondary | ICD-10-CM

## 2022-01-01 LAB — T4, FREE: Free T4: 0.71 ng/dL (ref 0.60–1.60)

## 2022-01-02 ENCOUNTER — Encounter: Payer: Self-pay | Admitting: Nurse Practitioner

## 2022-01-02 DIAGNOSIS — E785 Hyperlipidemia, unspecified: Secondary | ICD-10-CM

## 2022-01-02 MED ORDER — SIMVASTATIN 20 MG PO TABS: 20.0000 mg | ORAL_TABLET | Freq: Every day | ORAL | 3 refills | Status: DC

## 2022-01-14 ENCOUNTER — Encounter: Payer: Self-pay | Admitting: Nurse Practitioner

## 2022-01-15 ENCOUNTER — Other Ambulatory Visit: Payer: Self-pay

## 2022-01-15 ENCOUNTER — Ambulatory Visit (INDEPENDENT_AMBULATORY_CARE_PROVIDER_SITE_OTHER): Payer: Managed Care, Other (non HMO) | Admitting: Family Medicine

## 2022-01-15 ENCOUNTER — Encounter: Payer: Self-pay | Admitting: Family Medicine

## 2022-01-15 VITALS — BP 100/64 | HR 97 | Temp 98.4°F | Ht 63.5 in | Wt 192.2 lb

## 2022-01-15 DIAGNOSIS — M79671 Pain in right foot: Secondary | ICD-10-CM | POA: Diagnosis not present

## 2022-01-15 DIAGNOSIS — M7711 Lateral epicondylitis, right elbow: Secondary | ICD-10-CM

## 2022-01-15 DIAGNOSIS — M7712 Lateral epicondylitis, left elbow: Secondary | ICD-10-CM

## 2022-01-15 MED ORDER — MELOXICAM 15 MG PO TABS: 15.0000 mg | ORAL_TABLET | Freq: Every day | ORAL | 2 refills | Status: DC

## 2022-01-15 NOTE — Progress Notes (Signed)
? ? ?Danielle Mcpheeters T. Milon Dethloff, MD, [facility] ?Therapist, music at [facility] ?[address] ?[place], [address] ? ?Phone: [phone]  FAX: [phone] ? ?Danielle Strickland - 56 y.o. female  MRN [id number]  Date of Birth: Jan 24, 1966 ? ?Date: 01/15/2022  PCP: Michela Pitcher, NP  Referral: Michela Pitcher, NP ? ?Chief Complaint  ?Patient presents with  ? Foot Pain  ?  Right Ankle-Woke up Monday morning unable to walk  ? Elbow Pain  ?  Bilateral  ? ? ?This visit occurred during the SARS-CoV-2 public health emergency.  Safety protocols were in place, including screening questions prior to the visit, additional usage of staff PPE, and extensive cleaning of exam room while observing appropriate contact time as indicated for disinfecting solutions.  ? ?Subjective:  ? ?Danielle Strickland is a 56 y.o. very pleasant female patient with Body mass index is 33.52 kg/m?. who presents with the following: ? ?Friday woke up and had some ankle swelling. ? ?She is a very pleasant young lady, and I remember very well.  Friday morning she woke up and had some foot and ankle swelling without any cause whatsoever.  She was completely fine on Sunday, and then when she woke up she was having pain and some burning on the bottom of her foot.  She is able to move at the ankle as well.  She does have some swelling about the ankle.  No trauma at all.  She denies a history of known gout or CPPD. ? ?She also has some ongoing tennis elbow that is been there for a while, and this does cause some pain with repetitive motion tasks, lifting coffee cup or supination. ? ?Review of Systems is noted in the HPI, as appropriate ? ?Objective:  ? ?BP 100/64   Pulse 97   Temp 98.4 ?F (36.9 ?C) (Oral)   Ht 5' 3.5" (1.613 m)   Wt 192 lb 4 oz (87.2 kg)   SpO2 96%   BMI 33.52 kg/m?  ? ?GEN: No acute distress; alert,appropriate. ?PULM: Breathing comfortably in no respiratory distress ?PSYCH: Normally interactive.  ? ?Foot and ankle exam:  Nontender along the tibia, fibula, medial and lateral malleolus.  ATFL, CFL, deltoid ligaments are nontender.  Nontender and stable anterior drawer test as well as subtalar tilt test.  Nontender calcaneus.  Nontender essentially throughout the bony anatomy.  There is some tenderness to compression and tenderness on the bottom of the foot, but not at the calcaneus. ? ?Elbow exam has diffuse tenderness at the lateral epicondyle, none at the medial epicondyles.  Full range of motion otherwise with no additional bony pain.  Strength is intact at 5/5 without neurovascular changes. ? ?Laboratory and Imaging Data: ? ?Assessment and Plan:  ? ?  ICD-10-CM   ?1. Acute foot pain, right  M79.671   ?  ?2. Bilateral tennis elbow  M77.11   ? M77.12   ?  ? ?There is no history at all of any kind of inciting injury or event, she woke up on Monday with some fairly severe pain with difficulty walking.  A history of gout versus CPPD would support this.  Otherwise, unclear etiology.  I am going to place the patient on some high-dose meloxicam.  She is healthy with healthy renal function, and also think that she could use this intermittently after this acute setting. ? ?Bilateral tennis elbow.  This is challenging.  We talked about unloading the [facility].  This is reasonably  short-lived, so hopefully this will resolve.  If symptoms persist over the next month or so, and we can recheck it in the office. ? ?Patient Instructions  ?Voltaren 1% gel, over the counter ?You can apply up to 4 times a day ? ?This can be applied to any joint: knee, wrist, fingers, elbows, shoulders, feet and ankles. ?Can apply to any tendon: tennis elbow, achilles, tendon, rotator cuff or any other tendon. ? ?Minimal is absorbed in the bloodstream: ok with oral anti-inflammatory or a blood thinner. ? ?Cost is about 9 dollars   ? ?Meds ordered this encounter  ?Medications  ? meloxicam (MOBIC) 15 MG tablet  ?  Sig: Take 1 tablet (15 mg total) by mouth daily.  ?   Dispense:  30 tablet  ?  Refill:  2  ? ?Medications Discontinued During This Encounter  ?Medication Reason  ? meloxicam (MOBIC) 7.5 MG tablet   ? ?No orders of the defined types were placed in this encounter. ? ? ?Follow-up: No follow-ups on file. ? ?[employer] One speech-to-text software was used for transcription in this dictation.  Possible transcriptional errors can occur using Editor, commissioning.  ? ?Signed, ? ?Georgina Krist T. Chasey Dull, MD ? ? ?Outpatient Encounter Medications as of 01/15/2022  ?Medication Sig  ? acyclovir (ZOVIRAX) 200 MG capsule TAKE 2 CAPSULES BY MOUTH 2 TO 3 TIMES DAILY FOR 5 DAYS AS NEEDED  ? albuterol (VENTOLIN HFA) 108 (90 Base) MCG/ACT inhaler Inhale 2 puffs into the lungs every 4 (four) hours as needed for wheezing or shortness of breath.  ? citalopram (CELEXA) 10 MG tablet Take 1.5 tablets (15 mg total) by mouth daily.  ? desonide (DESOWEN) 0.05 % ointment As needed  ? fexofenadine (ALLEGRA) 180 MG tablet Take 180 mg by mouth daily. Over the counter.  ? meloxicam (MOBIC) 15 MG tablet Take 1 tablet (15 mg total) by mouth daily.  ? pantoprazole (PROTONIX) 40 MG tablet Take 1 tablet (40 mg total) by mouth daily.  ? promethazine (PHENERGAN) 25 MG tablet Take 1q8h prn N&V with Migraines  ? simvastatin (ZOCOR) 20 MG tablet Take 1 tablet (20 mg total) by mouth at bedtime.  ? SUMAtriptan (IMITREX) 50 MG tablet Take 1 tablet (50 mg total) by mouth every 2 (two) hours as needed for migraine.  ? [DISCONTINUED] meloxicam (MOBIC) 7.5 MG tablet SMARTSIG:1 Tablet(s) By Mouth Every 12 Hours  ? ?No facility-administered encounter medications on file as of 01/15/2022.  ?  ?

## 2022-01-15 NOTE — Patient Instructions (Signed)
Voltaren 1% gel, over the counter ?You can apply up to 4 times a day ? ?This can be applied to any joint: knee, wrist, fingers, elbows, shoulders, feet and ankles. ?Can apply to any tendon: tennis elbow, achilles, tendon, rotator cuff or any other tendon. ? ?Minimal is absorbed in the bloodstream: ok with oral anti-inflammatory or a blood thinner. ? ?Cost is about 9 dollars  ?

## 2022-01-20 ENCOUNTER — Other Ambulatory Visit: Payer: Self-pay

## 2022-01-20 DIAGNOSIS — Z87891 Personal history of nicotine dependence: Secondary | ICD-10-CM

## 2022-02-03 ENCOUNTER — Ambulatory Visit (INDEPENDENT_AMBULATORY_CARE_PROVIDER_SITE_OTHER): Payer: Managed Care, Other (non HMO) | Admitting: Acute Care

## 2022-02-03 ENCOUNTER — Ambulatory Visit
Admission: RE | Admit: 2022-02-03 | Discharge: 2022-02-03 | Disposition: A | Discharge status: Home / Self Care | Payer: Managed Care, Other (non HMO) | Source: Ambulatory Visit | Attending: Nurse Practitioner | Admitting: Nurse Practitioner

## 2022-02-03 ENCOUNTER — Encounter: Payer: Self-pay | Admitting: Acute Care

## 2022-02-03 ENCOUNTER — Other Ambulatory Visit: Payer: Self-pay

## 2022-02-03 DIAGNOSIS — Z87891 Personal history of nicotine dependence: Secondary | ICD-10-CM | POA: Diagnosis present

## 2022-02-03 NOTE — Patient Instructions (Signed)

## 2022-02-03 NOTE — Progress Notes (Signed)
Virtual Visit via Telephone Note ? ?I connected with Danielle Strickland on 02/03/22 at  3:30 PM EDT by telephone and verified that I am speaking with the correct person using two identifiers. ? ?Location: ?Patient:  At home ?Provider:  [place], [place], [place], [address]  ?  ?I discussed the limitations, risks, security and privacy concerns of performing an evaluation and management service by telephone and the availability of in person appointments. I also discussed with the patient that there may be a patient responsible charge related to this service. The patient expressed understanding and agreed to proceed. ? ? ?Shared Decision Making Visit Lung Cancer Screening Program ?([phone]) ? ? ?Eligibility: ?Age 56 y.o. ?Pack Years Smoking History Calculation  32 pack year smoking history ?(# packs/per year x # years smoked) ?Recent History of coughing up blood  no ?Unexplained weight loss? no ?( >Than 15 pounds within the last 6 months ) ?Prior History Lung / other cancer no ?(Diagnosis within the last 5 years already requiring surveillance chest CT Scans). ?Smoking Status Former Smoker ?Former Smokers: Years since quit: 7 years ? Quit Date: 03/03/2015 ? ?Visit Components: ?Discussion included one or more decision making aids. yes ?Discussion included risk/benefits of screening. yes ?Discussion included potential follow up diagnostic testing for abnormal scans. yes ?Discussion included meaning and risk of over diagnosis. yes ?Discussion included meaning and risk of False Positives. yes ?Discussion included meaning of total radiation exposure. yes ? ?Counseling Included: ?Importance of adherence to annual lung cancer LDCT screening. yes ?Impact of comorbidities on ability to participate in the program. yes ?Ability and willingness to under diagnostic treatment. yes ? ?Smoking Cessation Counseling: ?Current Smokers:  ?Discussed importance of smoking cessation. yes ?Information about tobacco cessation classes and  interventions provided to patient. yes ?Patient provided with "ticket" for LDCT Scan. yes ?Symptomatic Patient. no ? Counseling NA ?Diagnosis Code: Tobacco Use Z72.0 ?Asymptomatic Patient yes ? Counseling (Intermediate counseling: > three minutes counseling) G8366 ?Former Smokers:  ?Discussed the importance of maintaining cigarette abstinence. yes ?Diagnosis Code: Personal History of Nicotine Dependence. Q94.765 ?Information about tobacco cessation classes and interventions provided to patient. Yes ?Patient provided with "ticket" for LDCT Scan. yes ?Written Order for Lung Cancer Screening with LDCT placed in Epic. Yes ?(CT Chest Lung Cancer Screening Low Dose W/O CM) [id number] ?Z12.2-Screening of respiratory organs ?Z87.891-Personal history of nicotine dependence ? ?I spent 25 minutes of face to face time/virtual visit time  with  [name] discussing the risks and benefits of lung cancer screening. We took the time to pause the power point at intervals to allow for questions to be asked and answered to ensure understanding. We discussed that she had taken the single most powerful action possible to decrease her risk of developing lung cancer when she quit smoking. I counseled her to remain smoke free, and to contact me if she ever had the desire to smoke again so that I can provide resources and tools to help support the effort to remain smoke free. We discussed the time and location of the scan, and that either  [name], [name], RN or I  or I will call / send a letter with the results within  24-72 hours of receiving them. She has the office contact information in the event she needs to speak with me,  she verbalized understanding of all of the above and had no further questions upon leaving the office.  ? ? ? ?I explained to the patient that  there has been a high incidence of coronary artery disease noted on these exams. I explained that this is a non-gated exam therefore degree or severity cannot  be determined. This patient is on statin therapy. I have asked the patient to follow-up with their PCP regarding any incidental finding of coronary artery disease and management with diet or medication as they feel is clinically indicated. The patient verbalized understanding of the above and had no further questions. ?  ? ? ?Danielle Spatz, NP ?02/03/2022 ? ? ? ? ? ? ?

## 2022-02-05 ENCOUNTER — Encounter: Payer: Self-pay | Admitting: Nurse Practitioner

## 2022-02-05 ENCOUNTER — Other Ambulatory Visit: Payer: Self-pay

## 2022-02-05 DIAGNOSIS — Z122 Encounter for screening for malignant neoplasm of respiratory organs: Secondary | ICD-10-CM

## 2022-02-05 DIAGNOSIS — Z87891 Personal history of nicotine dependence: Secondary | ICD-10-CM

## 2022-02-06 ENCOUNTER — Telehealth: Payer: Self-pay | Admitting: Acute Care

## 2022-02-06 ENCOUNTER — Other Ambulatory Visit: Payer: Self-pay | Admitting: Acute Care

## 2022-02-06 ENCOUNTER — Encounter: Payer: Self-pay | Admitting: Nurse Practitioner

## 2022-02-06 DIAGNOSIS — F418 Other specified anxiety disorders: Secondary | ICD-10-CM

## 2022-02-06 DIAGNOSIS — R0609 Other forms of dyspnea: Secondary | ICD-10-CM

## 2022-02-06 NOTE — Progress Notes (Signed)
Pt. Called with LDCT scan results ?

## 2022-02-06 NOTE — Telephone Encounter (Signed)
I spoke with patient via phone regarding her CT scan results and next steps. ? ?She was referred to pulmonology per Eric Form, NP for exertional dyspnea in the setting of emphysema.  ? ?Consider better lipid control with higher intensity statin given recent LDL results in the setting of aortic atherosclerosis.  ? ?Also consider cardiology referral for history of exertional dyspnea to rule out cardiac cause. ? ?Will forward to PCP who has a phone visit scheduled with patient for next week.  Patient thanked me for my call and looks forward to speaking with her PCP next week during the visit. ?

## 2022-02-06 NOTE — Telephone Encounter (Signed)
CT results were faxed to PCP. Order was placed for 12 mth f/u low dose screening CT scan.  ?

## 2022-02-06 NOTE — Telephone Encounter (Signed)
I have called the patient with the results of her low dose CT Chest. Scan was read as a Lung RADS 2: nodules that are benign in appearance and behavior with a very low likelihood of becoming a clinically active cancer due to size or lack of growth. Recommendation per radiology is for a repeat LDCT in 12 months.  ? ?There was notation of emphysema on her scan which she is very concerned about. She quit smoking 2016. She does have exertional dyspnea. She has a rescue inhaler, but no maintenance, and she has not had PFT's. I have referred her to [facility] in [place] to be evaluated for COPD and possible need for maintenance inhalers.  ? ?There was also notation of Atherosclerotic nonaneurysmal thoracic aorta.She is very concerned about this. Both of her parents dies young, so she does not kknow if there was any cardiac history in her family. I have asked her to talk with her NP Karl Ito about a cardiology referral . ? ?Denise, 12 month follow up LDCT. Fax results to PCP. Thanks so much ?

## 2022-02-06 NOTE — Telephone Encounter (Signed)
I saw a note that Maggie Schwalbe called and spoke with the patient? Is she needing a call from me when I return to office. I will also send her a note via mychart ?

## 2022-02-09 MED ORDER — HYDROXYZINE PAMOATE 25 MG PO CAPS: 25.0000 mg | ORAL_CAPSULE | Freq: Two times a day (BID) | ORAL | 0 refills | Status: DC | PRN

## 2022-02-11 ENCOUNTER — Telehealth (INDEPENDENT_AMBULATORY_CARE_PROVIDER_SITE_OTHER): Payer: Managed Care, Other (non HMO) | Admitting: Nurse Practitioner

## 2022-02-11 ENCOUNTER — Encounter: Payer: Self-pay | Admitting: Nurse Practitioner

## 2022-02-11 VITALS — HR 80

## 2022-02-11 DIAGNOSIS — F411 Generalized anxiety disorder: Secondary | ICD-10-CM | POA: Diagnosis not present

## 2022-02-11 DIAGNOSIS — F419 Anxiety disorder, unspecified: Secondary | ICD-10-CM | POA: Diagnosis not present

## 2022-02-11 DIAGNOSIS — I700 Atherosclerosis of aorta: Secondary | ICD-10-CM

## 2022-02-11 DIAGNOSIS — J432 Centrilobular emphysema: Secondary | ICD-10-CM | POA: Diagnosis not present

## 2022-02-11 NOTE — Assessment & Plan Note (Signed)
Patient was maintained on citalopram 5 mg.  Recent diagnosis of emphysema through low-dose CT scan.  Patient's anxiety has increased since then she is experiencing chest tightness and pain.  We will continue citalopram 5 mg daily did add on hydroxyzine 25 mg twice daily as needed, sedation precautions reviewed.  Did give her signs and symptoms when to be seen urgent or emergent in regard to the chest tightness/pain.  ASCVD risk score of 2.1% last LDL 110 currently on Zocor 20 mg. ?

## 2022-02-11 NOTE — Assessment & Plan Note (Signed)
Maintained on Zocor 20 mg.  Patient was concerned as this is a diagnosis seen on CT scan.  Has been on previous CT scan in the past.  Patient has ASCVD risk score of 2.1% currently on statin therapy.  No history of cardiovascular events per her or her family.  The dyspnea on exertion likely is related to the emphysema.  We did discuss referring to cardiology as the pulmonary NP and mention.  Given patient's symptoms of chest tightness discomfort started after she received diagnosis of emphysema do think this is related to her anxiety.  We will have her cleared from pulmonology first if they do not find a cause for her shortness of breath we will refer her to cardiology.  Did review and discussed this with patient she is agreement with plan.  Did review signs and symptoms when to be seen emergently due to chest tightness/discomfort ?

## 2022-02-11 NOTE — Progress Notes (Signed)
? ? Patient ID: Danielle Strickland, female    DOB: 1966-02-21, 56 y.o.   MRN: [id number] ? ?Virtual visit completed through [employer], a video enabled telemedicine application. Due to national recommendations of social distancing due to COVID-19, a virtual visit is felt to be most appropriate for this patient at this time. Reviewed limitations, risks, security and privacy concerns of performing a virtual visit and the availability of in person appointments. I also reviewed that there may be a patient responsible charge related to this service. The patient agreed to proceed.  ? ?Patient location: home ?Provider location: Financial controller at [facility], office ?Persons participating in this virtual visit: patient, provider  ? ?If any vitals were documented, they were collected by patient at home unless specified below.   ? ?Pulse 80 Comment: per patient  SpO2 97%   ? ?CC: Depression/anxiety ?Subjective:  ? ?HPI: ?Danielle Strickland is a 56 y.o. female presenting on 02/11/2022 for Depression (Follow up) ? ? ?Patient was seen by me on 12/31/2021 and was on a higher dose of citalopram. We reduced her to a 0.5 pill ('5mg'$ ) daily and patient has done well.  We did the changes she felt more anxious on the higher dose when driving but this is now resolved ? ?She recently had a LDCT scan of her chest and noted to have aortic atherosclerosis and Emphysema. Since then she has been experiencing chest tightness. I spoke with patient on the phone on 02/09/2022 and she states after having that converstation that she felt better. She was written hydroxzyine as needed for anxiety that she has not had it yet.  ? ?Patient pending appointment with pulmonology for official PFTs stage emphysema.  Patient is concerned about discomfort and shortness of breath and mentioned that pulmonary NP mention possible cardiology referral.  Discussed with patient giving her lipids are trending down no history of cardiovascular event and ASCVD risk score of 2.1%  given sudden onset with CT scan diagnosis we can defer cardiology referral until pulmonology clears her. ? ? ? ? ?  02/11/2022  ?  2:01 PM 12/31/2021  ? 10:28 AM 06/18/2021  ?  9:51 AM  ?PHQ9 SCORE ONLY  ?PHQ-9 Total Score '5 4 5  '$ ?  ? ?  02/11/2022  ?  2:04 PM 12/31/2021  ? 10:28 AM 06/18/2021  ?  9:52 AM 07/22/2018  ? 10:52 AM  ?GAD 7 : Generalized Anxiety Score  ?Nervous, Anxious, on Edge '2 1 1 2  '$ ?Control/stop worrying 0 0 0 2  ?Worry too much - different things 0 0 1 2  ?Trouble relaxing 0 '1 1 2  '$ ?Restless '1 1 1 3  '$ ?Easily annoyed or irritable 1 1 0 1  ?Afraid - awful might happen 0 0 0 0  ?Total GAD 7 Score '4 4 4 12  '$ ?Anxiety Difficulty  Somewhat difficult Somewhat difficult   ? ?  ? ? ? ?Relevant past medical, surgical, family and social history reviewed and updated as indicated. Interim medical history since our last visit reviewed. ?Allergies and medications reviewed and updated. ?Outpatient Medications Prior to Visit  ?Medication Sig Dispense Refill  ? acyclovir (ZOVIRAX) 200 MG capsule TAKE 2 CAPSULES BY MOUTH 2 TO 3 TIMES DAILY FOR 5 DAYS AS NEEDED 60 capsule 6  ? albuterol (VENTOLIN HFA) 108 (90 Base) MCG/ACT inhaler Inhale 2 puffs into the lungs every 4 (four) hours as needed for wheezing or shortness of breath. 18 g 1  ? citalopram (CELEXA) 10 MG tablet  Take 1.5 tablets (15 mg total) by mouth daily. (Patient taking differently: Take 5 mg by mouth daily.) 135 tablet 3  ? desonide (DESOWEN) 0.05 % ointment As needed    ? fexofenadine (ALLEGRA) 180 MG tablet Take 180 mg by mouth daily. Over the counter.    ? hydrOXYzine (VISTARIL) 25 MG capsule Take 1 capsule (25 mg total) by mouth 2 (two) times daily as needed for up to 15 days. 30 capsule 0  ? meloxicam (MOBIC) 15 MG tablet Take 1 tablet (15 mg total) by mouth daily. 30 tablet 2  ? pantoprazole (PROTONIX) 40 MG tablet Take 1 tablet (40 mg total) by mouth daily. 30 tablet 11  ? promethazine (PHENERGAN) 25 MG tablet Take 1q8h prn N&V with Migraines 30 tablet 2  ?  simvastatin (ZOCOR) 20 MG tablet Take 1 tablet (20 mg total) by mouth at bedtime. 90 tablet 3  ? SUMAtriptan (IMITREX) 50 MG tablet Take 1 tablet (50 mg total) by mouth every 2 (two) hours as needed for migraine. 10 tablet 2  ? ?No facility-administered medications prior to visit.  ?  ? ?Per HPI unless specifically indicated in ROS section below ?Review of Systems  ?Constitutional:  Negative for chills and fever.  ?Respiratory:  Positive for chest tightness and shortness of breath (DOE baseline).   ?Cardiovascular:  Positive for chest pain.  ?Psychiatric/Behavioral:  The patient is nervous/anxious.   ?Objective:  ?Pulse 80 Comment: per patient  SpO2 97%   ?Wt Readings from Last 3 Encounters:  ?01/15/22 192 lb 4 oz (87.2 kg)  ?12/31/21 190 lb 7 oz (86.4 kg)  ?12/09/21 189 lb (85.7 kg)  ?  ?  ? ?Physical exam: ?Gen: alert, NAD, not ill appearing ?Pulm: speaks in complete sentences without increased work of breathing ?Psych: normal mood, normal thought content  ? ?   ?Results for orders placed or performed in visit on 01/01/22  ?T4, free  ?Result Value Ref Range  ? Free T4 0.71 0.60 - 1.60 ng/dL  ? ?Assessment & Plan:  ? ?Problem List Items Addressed This Visit   ? ?  ? Cardiovascular and Mediastinum  ? Aortic atherosclerosis ([place]) - Primary  ?  Maintained on Zocor 20 mg.  Patient was concerned as this is a diagnosis seen on CT scan.  Has been on previous CT scan in the past.  Patient has ASCVD risk score of 2.1% currently on statin therapy.  No history of cardiovascular events per her or her family.  The dyspnea on exertion likely is related to the emphysema.  We did discuss referring to cardiology as the pulmonary NP and mention.  Given patient's symptoms of chest tightness discomfort started after she received diagnosis of emphysema do think this is related to her anxiety.  We will have her cleared from pulmonology first if they do not find a cause for her shortness of breath we will refer her to cardiology.  Did  review and discussed this with patient she is agreement with plan.  Did review signs and symptoms when to be seen emergently due to chest tightness/discomfort ? ?  ?  ?  ? Respiratory  ? Centrilobular emphysema ([place])  ?  Diagnosis by low-dose CT scan.  Patient has been referred to pulmonology through Eric Form, NP.  Patient has not heard from referral yet but looking computer is in works and she has been here with provider.  Make appointment to follow through we will discuss PFTs and further work-up after getting note ? ?  ?  ?  ?  Other  ? Generalized anxiety disorder (Chronic)  ?  Patient was maintained on citalopram 5 mg.  Recent diagnosis of emphysema through low-dose CT scan.  Patient's anxiety has increased since then she is experiencing chest tightness and pain.  We will continue citalopram 5 mg daily did add on hydroxyzine 25 mg twice daily as needed, sedation precautions reviewed.  Did give her signs and symptoms when to be seen urgent or emergent in regard to the chest tightness/pain.  ASCVD risk score of 2.1% last LDL 110 currently on Zocor 20 mg. ? ?  ?  ? Anxiety  ?  ? ?No orders of the defined types were placed in this encounter. ? ?No orders of the defined types were placed in this encounter. ? ? ?I discussed the assessment and treatment plan with the patient. The patient was provided an opportunity to ask questions and all were answered. The patient agreed with the plan and demonstrated an understanding of the instructions. The patient was advised to call back or seek an in-person evaluation if the symptoms worsen or if the condition fails to improve as anticipated. ? ?Follow up plan: ?No follow-ups on file. ? ?Romilda Garret, NP   ?

## 2022-02-11 NOTE — Assessment & Plan Note (Signed)
Diagnosis by low-dose CT scan.  Patient has been referred to pulmonology through Eric Form, NP.  Patient has not heard from referral yet but looking computer is in works and she has been here with provider.  Make appointment to follow through we will discuss PFTs and further work-up after getting note ?

## 2022-02-13 ENCOUNTER — Encounter: Payer: Self-pay | Admitting: Family Medicine

## 2022-02-19 MED ORDER — HYDROXYZINE HCL 10 MG PO TABS: 10.0000 mg | ORAL_TABLET | Freq: Two times a day (BID) | ORAL | 0 refills | Status: DC | PRN

## 2022-02-19 NOTE — Addendum Note (Signed)
Addended by: Michela Pitcher on: 02/19/2022 01:22 PM ? ? Modules accepted: Orders ? ?

## 2022-02-23 ENCOUNTER — Ambulatory Visit: Payer: Managed Care, Other (non HMO) | Admitting: Nurse Practitioner

## 2022-02-23 ENCOUNTER — Encounter: Payer: Self-pay | Admitting: Nurse Practitioner

## 2022-02-23 VITALS — BP 136/78 | HR 85 | Temp 96.2°F | Resp 14 | Ht 63.5 in | Wt 193.0 lb

## 2022-02-23 DIAGNOSIS — F419 Anxiety disorder, unspecified: Secondary | ICD-10-CM

## 2022-02-23 DIAGNOSIS — F4310 Post-traumatic stress disorder, unspecified: Secondary | ICD-10-CM

## 2022-02-23 DIAGNOSIS — R42 Dizziness and giddiness: Secondary | ICD-10-CM | POA: Diagnosis not present

## 2022-02-23 DIAGNOSIS — R600 Localized edema: Secondary | ICD-10-CM

## 2022-02-23 DIAGNOSIS — K120 Recurrent oral aphthae: Secondary | ICD-10-CM

## 2022-02-23 MED ORDER — MECLIZINE HCL 12.5 MG PO TABS: 12.5000 mg | ORAL_TABLET | Freq: Every day | ORAL | 0 refills | Status: DC | PRN
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
## 2022-02-23 NOTE — Assessment & Plan Note (Signed)
Increasing as of late dealing with her daughter whose husband died at early age due to an MI.  States she is driving 45 minutes to an hour each way to work.  Recently got diagnosed with emphysema from low-dose CT scan.  Patient is under a lot of stress.  We will increase citalopram 5 mg back to 10 mg.  Patient acknowledged understood follow-up if no improvement ?
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
## 2022-02-23 NOTE — Patient Instructions (Signed)
Nice to see you today ?You can try compression hose to see if that helps on the swelling of your feet ?I sent in meclizine to help with the lightheadedness ?Follow up if you need me ?We will up the citalopram to '10mg'$  (1 tablet) daily ?
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
## 2022-02-23 NOTE — Assessment & Plan Note (Signed)
Patient is using over the counter treatments with good relief.  Things to be healing.  Continue over-the-counter treatments follow-up if no improvement.  Do think this is partly stress-induced ?
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
## 2022-02-25 ENCOUNTER — Encounter: Payer: Self-pay | Admitting: Nurse Practitioner

## 2022-03-02 ENCOUNTER — Encounter: Payer: Self-pay | Admitting: Nurse Practitioner

## 2022-03-02 ENCOUNTER — Other Ambulatory Visit: Payer: Self-pay | Admitting: Nurse Practitioner

## 2022-03-02 DIAGNOSIS — K120 Recurrent oral aphthae: Secondary | ICD-10-CM

## 2022-03-02 MED ORDER — NYSTATIN 100000 UNIT/ML MT SUSP: 10.0000 mL | Freq: Three times a day (TID) | OROMUCOSAL | 0 refills | Status: DC

## 2022-03-03 ENCOUNTER — Ambulatory Visit (INDEPENDENT_AMBULATORY_CARE_PROVIDER_SITE_OTHER): Payer: Commercial Managed Care - HMO | Admitting: Nurse Practitioner

## 2022-03-03 ENCOUNTER — Other Ambulatory Visit: Payer: Self-pay | Admitting: Nurse Practitioner

## 2022-03-03 ENCOUNTER — Encounter: Payer: Self-pay | Admitting: Nurse Practitioner

## 2022-03-03 VITALS — BP 138/84 | HR 90 | Temp 97.2°F | Resp 14 | Wt 194.2 lb

## 2022-03-03 DIAGNOSIS — K120 Recurrent oral aphthae: Secondary | ICD-10-CM

## 2022-03-03 MED ORDER — TRIAMCINOLONE ACETONIDE 0.1 % MT PSTE: 1.0000 "application " | PASTE | Freq: Two times a day (BID) | OROMUCOSAL | 2 refills | Status: DC

## 2022-03-03 NOTE — Patient Instructions (Signed)
Nice to see you today ?If you use the mouth wash I sent in stop using the benadryl and milk of mag over the counter. Hold off on the salt water gargles too.  ?Keep me updated on mychart ?

## 2022-03-03 NOTE — Progress Notes (Signed)
? ?  Acute Office Visit ? ?Subjective:  ? ?  ?Patient ID: Danielle Strickland, female    DOB: 19-Apr-1966, 56 y.o.   MRN: [id number] ? ?Chief Complaint  ?Patient presents with  ? Mouth Lesions  ?  Ulcers/canker sores, tongue feels swollen.   ? ? ? ?Patient is in today for canker sore ? ?Has been using benadryl with milk of mag, anbsol, warm salt water gargle. Without good relief. ? ?Throat started to hurt on the right side. Started yesterday. Hurts all the time. Salt water garggles have not helped. ? ?Patient had sent me a MyChart message regarding previous office visit and canker sores.  Did represcribe Magic mouthwash patient not get notification until after she was on her way to the visit today.  She has not started medication as of yet ? ?Review of Systems  ?Constitutional:  Positive for chills. Negative for fever.  ?HENT:  Positive for mouth sores.   ?     No trouble swallowing, breathing, talking.  Patient endorses difficulty talking eating and swallowing due to pain of ulcer on tongue  ? ? ?   ?Objective:  ?  ?BP 138/84   Pulse 90   Temp (!) 97.2 ?F (36.2 ?C)   Resp 14   Wt 194 lb 3 oz (88.1 kg)   SpO2 96%   BMI 33.86 kg/m?  ? ? ?Physical Exam ?Vitals and nursing note reviewed.  ?Constitutional:   ?   Appearance: Normal appearance.  ?HENT:  ?   Right Ear: Tympanic membrane, ear canal and external ear normal.  ?   Left Ear: Tympanic membrane, ear canal and external ear normal.  ?   Mouth/Throat:  ?   Mouth: Mucous membranes are moist.  ?   Pharynx: Oropharynx is clear.  ? ?Cardiovascular:  ?   Heart sounds: Normal heart sounds.  ?Pulmonary:  ?   Effort: Pulmonary effort is normal.  ?   Breath sounds: Normal breath sounds.  ?Neurological:  ?   Mental Status: She is alert.  ? ? ?No results found for any visits on 03/03/22. ? ? ?   ?Assessment & Plan:  ? ?Problem List Items Addressed This Visit   ? ?  ? Digestive  ? Canker sore - Primary  ?  Patient has tried over-the-counter conservative treatments with some  relief but still continue to develop canker sores.  Patient will stop over-the-counter Benadryl and Milk of Magnesia as I did send in Magic mouthwash.  Patient to pick up mouthwash use as directed and report back via MyChart if symptoms do not improve.  Did write patient a note today to be out of office as it does make it difficult to talk with a sore and creates more pain with tongue movement ? ?  ?  ? ? ?No orders of the defined types were placed in this encounter. ? ? ?No follow-ups on file. ? ?Romilda Garret, NP ? ? ?

## 2022-03-03 NOTE — Assessment & Plan Note (Signed)
Patient has tried over-the-counter conservative treatments with some relief but still continue to develop canker sores.  Patient will stop over-the-counter Benadryl and Milk of Magnesia as I did send in Magic mouthwash.  Patient to pick up mouthwash use as directed and report back via MyChart if symptoms do not improve.  Did write patient a note today to be out of office as it does make it difficult to talk with a sore and creates more pain with tongue movement ?

## 2022-03-03 NOTE — Progress Notes (Signed)
Orders only

## 2022-03-04 ENCOUNTER — Encounter: Payer: Self-pay | Admitting: Nurse Practitioner

## 2022-03-05 ENCOUNTER — Other Ambulatory Visit: Payer: Self-pay | Admitting: Nurse Practitioner

## 2022-03-05 DIAGNOSIS — G43709 Chronic migraine without aura, not intractable, without status migrainosus: Secondary | ICD-10-CM

## 2022-03-05 MED ORDER — PROMETHAZINE HCL 25 MG PO TABS: ORAL_TABLET | ORAL | 0 refills | Status: DC

## 2022-03-05 MED ORDER — SUMATRIPTAN SUCCINATE 50 MG PO TABS: ORAL_TABLET | ORAL | 2 refills | Status: AC

## 2022-03-19 ENCOUNTER — Telehealth: Payer: Self-pay | Admitting: Nurse Practitioner

## 2022-03-19 ENCOUNTER — Encounter: Payer: Self-pay | Admitting: Nurse Practitioner

## 2022-03-19 NOTE — Telephone Encounter (Signed)
Pt called and said that she just found that [employer] doesn't accept her insurance and because of that she has racked up almost a thousand dollars in medical bills with [place]. She said when she originally told us that her insurance was changing and what it switched to that we told her we do accept her insurance but didn't say anything about [facility] not accepting it. She said she called her insurance after she got the bill saying what she owed and they sent her a list of providers at this practice that do take it but none were taking new patients. Sshe said she really wants to stay with [facility] but cant afford it and wanted to know if there is anyway we can help her out. She requested a call back at [phone].

## 2022-03-26 ENCOUNTER — Encounter: Payer: Self-pay | Admitting: Nurse Practitioner

## 2022-04-13 ENCOUNTER — Other Ambulatory Visit: Payer: Self-pay | Admitting: Family Medicine

## 2022-04-13 ENCOUNTER — Encounter: Payer: Self-pay | Admitting: Family Medicine

## 2022-04-14 MED ORDER — MELOXICAM 15 MG PO TABS: 15.0000 mg | ORAL_TABLET | Freq: Every day | ORAL | 1 refills | Status: DC

## 2022-04-30 ENCOUNTER — Encounter: Payer: Self-pay | Admitting: Pulmonary Disease

## 2022-04-30 ENCOUNTER — Ambulatory Visit: Payer: Commercial Managed Care - HMO | Admitting: Pulmonary Disease

## 2022-04-30 VITALS — BP 136/82 | HR 87 | Temp 97.9°F | Ht 63.5 in | Wt 191.6 lb

## 2022-04-30 DIAGNOSIS — R0602 Shortness of breath: Secondary | ICD-10-CM

## 2022-04-30 DIAGNOSIS — J449 Chronic obstructive pulmonary disease, unspecified: Secondary | ICD-10-CM | POA: Diagnosis not present

## 2022-04-30 DIAGNOSIS — Z9189 Other specified personal risk factors, not elsewhere classified: Secondary | ICD-10-CM

## 2022-04-30 NOTE — Patient Instructions (Signed)
We are going to get some breathing tests and a heart test.  Breathing test will let me know if you need an inhaler and what type would be the best.  Please allow 5 to 7 days after test date for results due to the time required for processing and for physician review.  We are scheduling you for a home sleep test as well.  We will call you with the results of the tests as they become available.  We will see you in follow-up in 6 to 8 weeks time call sooner should any new problems arise.

## 2022-04-30 NOTE — Progress Notes (Signed)
Subjective:    Patient ID: Danielle Strickland, female    DOB: 09-17-1966, 56 y.o.   MRN: [id number] Patient Care Team: Michela Pitcher, NP as PCP - General (Pain Medicine)  Chief Complaint  Patient presents with   pulmonary consult    SOB with exertion and talking.    HPI This is a 56 year old former smoker (32 pack years, quit 2016) who presents for evaluation of dyspnea present for over a years duration.  Dyspnea mostly on overexertion and carrying heavy things.  Her primary care practitioner is Penelope Galas, NP.  She is kindly referred by Eric Form, NP.  She had a low-dose CT chest in April 2023 that showed centrilobular and paraseptal emphysema.  This as the patient concerned.  The patient states that she had COVID-19 in December 2020 she recovered from the illness but started noticing some mild dyspnea on exertion after that.  She notes that when she is exerting herself she has some wheezing.  She has no chest pain but does experience occasional tachypalpitations.  She is on albuterol as needed and does note that this relieves her symptoms somewhat.  She also endorses nonrestorative sleep, nocturnal awakenings and snoring.  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  No military history.   Review of Systems A 10 point review of systems was performed and it is as noted above otherwise negative.  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   History of migraine    HPV in female    IBS (irritable bowel syndrome)    Liver disease    H/O hepatitis   Mental disorder    anxiety   Microscopic hematuria    Migraines    Peptic ulcer disease    Skin cancer    left thigh   Tobacco abuse 02/21/2013   Past Surgical History:  Procedure  Laterality Date   ABDOMINAL HYSTERECTOMY     APPENDECTOMY     CHOLECYSTECTOMY N/A 11/26/2015   Procedure: LAPAROSCOPIC CHOLECYSTECTOMY ;  Surgeon: Jules Husbands, MD;  Location: [facility] ORS;  Service: General;  Laterality: N/A;   COLONOSCOPY WITH PROPOFOL N/A 12/09/2021   Procedure: COLONOSCOPY WITH PROPOFOL;  Surgeon: Lucilla Lame, MD;  Location: [facility] ENDOSCOPY;  Service: Endoscopy;  Laterality: N/A;   laparoscopically assisted vaginal hysterectomy  2006   SALIVARY GLAND SURGERY     submandular     TUBAL LIGATION     WISDOM TOOTH EXTRACTION     Patient Active Problem List   Diagnosis Date Noted   Canker sore 02/23/2022   Bilateral lower extremity edema 02/23/2022   Centrilobular emphysema ([place]) 02/11/2022   Other fatigue 12/31/2021   Genital herpes simplex 12/31/2021   Morbid obesity ([place]) 12/31/2021   Screening for lung cancer 12/31/2021   Polyp of sigmoid colon    Elevated liver enzymes 06/18/2021   Vitamin D deficiency 03/28/2021   Aortic atherosclerosis ([place]) 01/25/2021   Degenerative disc disease, cervical 01/25/2021   IFG (impaired fasting glucose) 04/04/2020   Preventative health care 11/20/2019   COVID-19 virus infection 10/30/2019   SOB (shortness of breath) 08/09/2018   Palpitation 07/22/2018   Hypercholesterolemia 05/10/2018   Elevated TSH 05/10/2018   Restless  legs syndrome (RLS) 04/06/2018   Anxiety 02/01/2018   Gastroesophageal reflux disease 02/01/2018   Inflammatory disease of liver 02/01/2018   Menopausal syndrome 02/01/2018   Migraine 02/01/2018   Recurrent hematuria 02/01/2018   Complex regional pain syndrome type 1 affecting right lower leg 09/03/2016   PTSD (post-traumatic stress disorder) 03/02/2016   Malleolar fracture 12/10/2015   Hx MRSA infection 06/18/2015   Abnormal chest x-ray 05/29/2015   Light headedness 08/20/2014   Family history of colon cancer 02/21/2013   TMJ (dislocation of temporomandibular joint) 02/21/2013   Allergy    Skin cancer     Peptic ulcer disease    Generalized anxiety disorder    Herpes simplex without mention of complication    IBS (irritable bowel syndrome)    History of migraine    Family History  Problem Relation Age of Onset   Cancer Mother        colon    Hyperlipidemia Sister    Thyroid cancer Maternal Aunt    Breast cancer Maternal Aunt    Cancer Paternal Aunt        colon   Breast cancer Maternal Grandmother    Social History   Tobacco Use   Smoking status: Former    Packs/day: 1.00    Types: Cigarettes    Quit date: 03/03/2015    Years since quitting: 7.1   Smokeless tobacco: Never   Tobacco comments:    Quit May 8  Substance Use Topics   Alcohol use: No    Alcohol/week: 0.0 standard drinks of alcohol   Allergies  Allergen Reactions   Amoxicillin-Pot Clavulanate Nausea And Vomiting and Other (See Comments)    Has patient had a PCN reaction causing immediate rash, facial/tongue/throat swelling, SOB or lightheadedness with hypotension: No Has patient had a PCN reaction causing severe rash involving mucus membranes or skin necrosis: No Has patient had a PCN reaction that required hospitalization No Has patient had a PCN reaction occurring within the last 10 years: No If all of the above answers are "NO", then may proceed with Cephalosporin use. Other reaction(s): Vomiting   Latex Rash   Aspirin Nausea And Vomiting   Neosporin [Neomycin-Bacitracin Zn-Polymyx]    Current Meds  Medication Sig   acyclovir (ZOVIRAX) 200 MG capsule TAKE 2 CAPSULES BY MOUTH 2 TO 3 TIMES DAILY FOR 5 DAYS AS NEEDED   albuterol (VENTOLIN HFA) 108 (90 Base) MCG/ACT inhaler Inhale 2 puffs into the lungs every 4 (four) hours as needed for wheezing or shortness of breath.   citalopram (CELEXA) 10 MG tablet Take 1.5 tablets (15 mg total) by mouth daily. (Patient taking differently: Take 5 mg by mouth daily.)   desonide (DESOWEN) 0.05 % ointment As needed   fexofenadine (ALLEGRA) 180 MG tablet Take 180 mg by  mouth daily. Over the counter.   meloxicam (MOBIC) 15 MG tablet Take 1 tablet (15 mg total) by mouth daily.   pantoprazole (PROTONIX) 40 MG tablet Take 1 tablet (40 mg total) by mouth daily.   promethazine (PHENERGAN) 25 MG tablet Take 1q8h prn N&V with Migraines   simvastatin (ZOCOR) 20 MG tablet Take 1 tablet (20 mg total) by mouth at bedtime.   SUMAtriptan (IMITREX) 50 MG tablet Take 1 tablet at onset of migraine, may repeat in 2 hours if needed. No more than 2 doses in 24 hours   triamcinolone (KENALOG) 0.1 % paste Use as directed 1 application. in the mouth or throat 2 (two) times daily.   Immunization  History  Administered Date(s) Administered   PFIZER Comirnaty(Gray Top)Covid-19 Tri-Sucrose Vaccine 06/06/2020, 06/28/2020   Tdap 05/23/2016       Objective:   Physical Exam BP 136/82 (BP Location: Left Arm, Cuff Size: Large)   Pulse 87   Temp 97.9 F (36.6 C) (Temporal)   Ht 5' 3.5" (1.613 m)   Wt 191 lb 9.6 oz (86.9 kg)   SpO2 97%   BMI 33.41 kg/m  GENERAL: Obese woman, no acute distress, fully ambulatory, no conversational dyspnea. HEAD: Normocephalic, atraumatic.  EYES: Pupils equal, round, reactive to light.  No scleral icterus.  MOUTH: Dentition intact, oral mucosa moist.  No thrush. NECK: Supple. No thyromegaly. Trachea midline. No JVD.  No adenopathy. PULMONARY: Good air entry bilaterally.  Coarse, otherwise no adventitious sounds. CARDIOVASCULAR: S1 and S2. Regular rate and rhythm.  No rubs, murmurs or gallops heard. ABDOMEN: Benign. MUSCULOSKELETAL: No joint deformity, no clubbing, no edema.  NEUROLOGIC: No overt focal deficit, no gait disturbance, speech is fluent. SKIN: Intact,warm,dry. PSYCH: Mood and behavior normal.  Ambulatory oximetry was performed today: At rest heart rate was 89 bpm, oxygen saturation 96% on room air.  The patient ambulated 750 feet with mild shortness of breath experienced.  Heart rate max was 109 bpm O2 sat nadir 92%.     04/30/2022     4:00 PM 12/31/2021   10:28 AM  Results of the [facility] flowsheet  Sitting and reading 2 2  Watching TV 0 0  Sitting, inactive in a public place (e.g. a theatre or a meeting) 1 0  As a passenger in a car for an hour without a break 0 0  Lying down to rest in the afternoon when circumstances permit 0 2  Sitting and talking to someone 0 0  Sitting quietly after a lunch without alcohol 2 1  In a car, while stopped for a few minutes in traffic 0 0  Total score 5 5      Assessment & Plan:     ICD-10-CM   1. Shortness of breath  R06.02 ECHOCARDIOGRAM COMPLETE    Pulmonary Function Test [facility] Only   Will evaluate with PFTs and echo cardiogram    2. COPD suggested by initial evaluation ([place])  J44.9    Centrilobular and paraseptal emphysema noted on CT chest 01/2022 New as needed albuterol for now    3. At risk for sleep apnea  Z91.89 Home sleep test   Patient reports nonrestorative sleep Frequent nocturnal awakenings Snoring     Orders Placed This Encounter  Procedures   Pulmonary Function Test [facility] Only    Next available.    Standing Status:   Future    Number of Occurrences:   1    Standing Expiration Date:   05/01/2023    Order Specific Question:   Full PFT: includes the following: basic spirometry, spirometry pre & post bronchodilator, diffusion capacity (DLCO), lung volumes    Answer:   Full PFT   ECHOCARDIOGRAM COMPLETE    Standing Status:   Future    Number of Occurrences:   1    Standing Expiration Date:   10/31/2022    Order Specific Question:   Where should this test be performed    Answer:   [facility]    Order Specific Question:   Perflutren DEFINITY (image enhancing agent) should be administered unless hypersensitivity or allergy exist    Answer:   Administer Perflutren    Order Specific Question:   Reason for exam-Echo  Answer:   Dyspnea  R06.00   Home sleep test    Standing Status:   Future    Number of Occurrences:   1    Standing Expiration Date:   05/01/2023     Order Specific Question:   Where should this test be performed:    Answer:   [facility]   Appropriate testing has been ordered as above.  Will see the patient in 6 to 8 weeks time she is to call sooner should any new problems arise.  Renold Don, MD Advanced [facility]ch[facility]    *This note was dictated using voice recognition software/Dragon.  Despite best efforts to proofread, errors can occur which can change the meaning. Any transcriptional errors that result from this process are unintentional and may not be fully corrected at the time of dictation.

## 2022-05-01 ENCOUNTER — Ambulatory Visit: Payer: Commercial Managed Care - HMO | Attending: Pulmonary Disease

## 2022-05-01 DIAGNOSIS — R0602 Shortness of breath: Secondary | ICD-10-CM | POA: Diagnosis present

## 2022-05-01 DIAGNOSIS — Z87891 Personal history of nicotine dependence: Secondary | ICD-10-CM | POA: Insufficient documentation

## 2022-05-01 LAB — PULMONARY FUNCTION TEST ARMC ONLY
DL/VA % pred: 89 %
DL/VA: 3.82 ml/min/mmHg/L
DLCO unc % pred: 87 %
DLCO unc: 17.61 ml/min/mmHg
FEF 25-75 Post: 2.55 L/sec
FEF 25-75 Pre: 1.1 L/sec
FEF2575-%Change-Post: 131 %
FEF2575-%Pred-Post: 105 %
FEF2575-%Pred-Pre: 45 %
FEV1-%Change-Post: 68 %
FEV1-%Pred-Post: 91 %
FEV1-%Pred-Pre: 54 %
FEV1-Post: 2.33 L
FEV1-Pre: 1.39 L
FEV1FVC-%Change-Post: 70 %
FEV1FVC-%Pred-Pre: 62 %
FEV6-%Change-Post: 7 %
FEV6-%Pred-Post: 86 %
FEV6-%Pred-Pre: 80 %
FEV6-Post: 2.76 L
FEV6-Pre: 2.56 L
FEV6FVC-%Pred-Post: 103 %
FEV6FVC-%Pred-Pre: 103 %
FVC-%Change-Post: -1 %
FVC-%Pred-Post: 84 %
FVC-%Pred-Pre: 85 %
FVC-Post: 2.78 L
FVC-Pre: 2.82 L
Post FEV1/FVC ratio: 84 %
Post FEV6/FVC ratio: 100 %
Pre FEV1/FVC ratio: 49 %
Pre FEV6/FVC Ratio: 100 %
RV % pred: 101 %
RV: 1.94 L
TLC % pred: 96 %
TLC: 4.83 L

## 2022-05-01 MED ORDER — ALBUTEROL SULFATE (2.5 MG/3ML) 0.083% IN NEBU
3.0000 mL | INHALATION_SOLUTION | Freq: Once | RESPIRATORY_TRACT | Status: AC
  Administered 2022-05-01: 3 mL via RESPIRATORY_TRACT
  Filled 2022-05-01: qty 3

## 2022-05-11 ENCOUNTER — Telehealth: Payer: Self-pay | Admitting: Pulmonary Disease

## 2022-05-11 NOTE — Telephone Encounter (Signed)
Spoke to patient.  She is requesting PFT results prior to 07/16/2022. She is aware that Dr. Patsey Berthold is out off the office until 05/18/2022. She is okay with waiting.

## 2022-05-15 ENCOUNTER — Ambulatory Visit: Payer: Commercial Managed Care - HMO

## 2022-05-15 DIAGNOSIS — G4733 Obstructive sleep apnea (adult) (pediatric): Secondary | ICD-10-CM

## 2022-05-15 DIAGNOSIS — Z9189 Other specified personal risk factors, not elsewhere classified: Secondary | ICD-10-CM

## 2022-05-18 ENCOUNTER — Other Ambulatory Visit (HOSPITAL_COMMUNITY): Payer: Self-pay

## 2022-05-18 MED ORDER — FLUTICASONE-SALMETEROL 250-50 MCG/ACT IN AEPB: 1.0000 | INHALATION_SPRAY | Freq: Two times a day (BID) | RESPIRATORY_TRACT | 11 refills | Status: DC

## 2022-05-18 NOTE — Telephone Encounter (Signed)
Lm for patient.  

## 2022-05-18 NOTE — Telephone Encounter (Signed)
Patient is aware of below message and voiced her understanding.  Advair has been sent to preferred pharmacy. Nothing further needed.

## 2022-05-18 NOTE — Telephone Encounter (Signed)
Patient is aware of results and voiced her understanding.  It does not look like breztri nor Trelegy is covered by insurance.   Dr. Patsey Berthold, would you like to try a different inhaler?

## 2022-05-18 NOTE — Telephone Encounter (Signed)
Symbicort 160/4.5, 2 puffs bid or Advair Diskus 250/50 1 puff BID. However, these inhalers provide only partial meds than the prior ones. On return visit we can determine if she needs additional meds. Make sure she has albuterol for rescue.

## 2022-05-18 NOTE — Telephone Encounter (Addendum)
Spoke to patient and relayed below message/recommendations.  According to epic, Symbicort nor Advair is covered by insurance either.  She would like a test claim to be ran to determine co pay for Symbicort 160, Advair 250, Trelegy 100 and Breztri.   PA team, can you guys run a test claim. Thanks

## 2022-05-18 NOTE — Telephone Encounter (Signed)
She has moderate COPD.  She would benefit from either Breztri 2 puffs twice a day or Trelegy Ellipta 100 once daily.  Which ever her insurance covers.

## 2022-05-28 ENCOUNTER — Telehealth: Payer: Self-pay | Admitting: Nurse Practitioner

## 2022-05-28 ENCOUNTER — Ambulatory Visit (INDEPENDENT_AMBULATORY_CARE_PROVIDER_SITE_OTHER): Payer: Commercial Managed Care - HMO

## 2022-05-28 DIAGNOSIS — R0602 Shortness of breath: Secondary | ICD-10-CM

## 2022-05-28 LAB — ECHOCARDIOGRAM COMPLETE
AR max vel: 2.65 cm2
AV Area VTI: 2.43 cm2
AV Area mean vel: 2.42 cm2
AV Mean grad: 3 mmHg
AV Peak grad: 5.5 mmHg
Ao pk vel: 1.17 m/s
Area-P 1/2: 3.45 cm2
S' Lateral: 3 cm

## 2022-05-28 NOTE — Telephone Encounter (Signed)
Patient called requesting to speak to office manager about a billing issue, she wouldn't give me any details just asked for a manager to call her back. Callback is [phone]

## 2022-06-01 DIAGNOSIS — G4733 Obstructive sleep apnea (adult) (pediatric): Secondary | ICD-10-CM

## 2022-06-02 ENCOUNTER — Telehealth: Payer: Self-pay

## 2022-06-02 DIAGNOSIS — G4733 Obstructive sleep apnea (adult) (pediatric): Secondary | ICD-10-CM

## 2022-06-02 NOTE — Telephone Encounter (Signed)
-----   Message from Tyler Pita, MD sent at 06/02/2022  4:54 PM EDT ----- Study shows mild obstructive sleep apnea with an AHI of 8.9 and sats that go as low as 83%.  Recommend auto CPAP at 5 to 15 cm H2O. Mask of choice, heated humidity.

## 2022-06-02 NOTE — Telephone Encounter (Signed)
Lm for patient.  

## 2022-06-03 MED ORDER — BUDESONIDE-FORMOTEROL FUMARATE 160-4.5 MCG/ACT IN AERO: 2.0000 | INHALATION_SPRAY | Freq: Two times a day (BID) | RESPIRATORY_TRACT | 12 refills | Status: DC

## 2022-06-03 NOTE — Telephone Encounter (Signed)
Patient is aware of results and voiced her understanding.  She agrees with cpap.  Order has been placed.  She will back to schedule OV once setup on cpap.   She also stated that since start advair, she has developed shakes in the morning and mucus build up. She is unable to produce sputum. She is also requesting echo results.   Dr. Patsey Berthold, please advise. Thanks

## 2022-06-03 NOTE — Telephone Encounter (Signed)
Patient is aware of below message and voiced her understanding.  She would like to try Symbicort.  Rx has been sent to preferred pharmacy. Nothing further needed.

## 2022-06-03 NOTE — Telephone Encounter (Signed)
Review of the echo is under the echo results.  She may not be tolerating the Advair due to the powdered form.  She does have significant obstruction and an asthmatic component.  I believe Symbicort was not covered but we may need to make a prior Auth as she likely cannot tolerate powdered inhalers.  If we can get a prior Auth for Symbicort it would be 160/4.5, 2 puffs twice a day.

## 2022-06-09 ENCOUNTER — Encounter: Payer: Self-pay | Admitting: Pulmonary Disease

## 2022-06-09 NOTE — Telephone Encounter (Signed)
Dr. Gonzalez, please advise. Thanks 

## 2022-06-09 NOTE — Telephone Encounter (Signed)
This is from my note on 06/02/2022: "Review of the echo is under the echo results.  She may not be tolerating the Advair due to the powdered form.  She does have significant obstruction and an asthmatic component.  I believe Symbicort was not covered but we may need to make a prior Auth as she likely cannot tolerate powdered inhalers.  If we can get a prior Auth for Symbicort it would be 160/4.5, 2 puffs twice a day"  Could try to send a prescription for Symbicort, she may need a prior Auth for that.

## 2022-06-09 NOTE — Telephone Encounter (Signed)
PA team, please advise on Symbicort PA. Thanks

## 2022-06-15 ENCOUNTER — Telehealth: Payer: Self-pay

## 2022-06-15 ENCOUNTER — Other Ambulatory Visit (HOSPITAL_COMMUNITY): Payer: Self-pay

## 2022-06-15 NOTE — Telephone Encounter (Signed)
Dr. Gonzalez, please advise. Thanks 

## 2022-06-15 NOTE — Telephone Encounter (Signed)
Patient Advocate Encounter   Received notification that prior authorization is required for Symbicort 160-4.5 mcg/act. PA submitted and APPROVED on 06/15/2022.  Key [id number] Effective: 06/15/2022 - 06/15/2023  Clista Bernhardt, CPhT Rx Patient Advocate Specialist Phone: [phone]

## 2022-06-18 MED ORDER — BREZTRI AEROSPHERE 160-9-4.8 MCG/ACT IN AERO: 2.0000 | INHALATION_SPRAY | Freq: Two times a day (BID) | RESPIRATORY_TRACT | 0 refills | Status: DC

## 2022-06-18 NOTE — Telephone Encounter (Signed)
We can send in a prescription for Breztri 2 puffs twice a day.  She would not need Symbicort if she is taking the [place].

## 2022-06-18 NOTE — Telephone Encounter (Signed)
Dr. Patsey Berthold, please advise on Breztri. Thanks

## 2022-06-23 NOTE — Telephone Encounter (Signed)
The tremor may be related to the medication and the inhaler opens her lungs.  This usually is short-lived and does not last all day. Best fingers to use for oxygen monitoring are index middle and ring finger.  Sure that she does not have nail polish on.

## 2022-06-23 NOTE — Telephone Encounter (Signed)
Dr. Gonzalez, please advise. Thanks 

## 2022-06-24 NOTE — Telephone Encounter (Signed)
If she is having such symptoms, she can stop the inhaler just only use albuterol as needed and will discuss on return appointment.  She has significant COPD with an asthmatic component and she needs something to be treated with.

## 2022-07-02 ENCOUNTER — Other Ambulatory Visit: Payer: Self-pay | Admitting: Nurse Practitioner

## 2022-07-02 ENCOUNTER — Encounter: Payer: Self-pay | Admitting: Nurse Practitioner

## 2022-07-02 DIAGNOSIS — F411 Generalized anxiety disorder: Secondary | ICD-10-CM

## 2022-07-02 DIAGNOSIS — F4310 Post-traumatic stress disorder, unspecified: Secondary | ICD-10-CM

## 2022-07-02 DIAGNOSIS — A6000 Herpesviral infection of urogenital system, unspecified: Secondary | ICD-10-CM

## 2022-07-03 MED ORDER — ACYCLOVIR 200 MG PO CAPS: ORAL_CAPSULE | ORAL | 0 refills | Status: DC

## 2022-07-03 MED ORDER — CITALOPRAM HYDROBROMIDE 10 MG PO TABS: 15.0000 mg | ORAL_TABLET | Freq: Every day | ORAL | 3 refills | Status: DC

## 2022-07-09 ENCOUNTER — Ambulatory Visit
Admission: RE | Admit: 2022-07-09 | Discharge: 2022-07-09 | Disposition: A | Discharge status: Home / Self Care | Payer: Commercial Managed Care - HMO | Source: Ambulatory Visit | Attending: Emergency Medicine | Admitting: Emergency Medicine

## 2022-07-09 ENCOUNTER — Telehealth: Payer: Self-pay

## 2022-07-09 VITALS — BP 114/72 | HR 89 | Temp 97.9°F | Resp 16

## 2022-07-09 DIAGNOSIS — S91312A Laceration without foreign body, left foot, initial encounter: Secondary | ICD-10-CM | POA: Diagnosis not present

## 2022-07-09 DIAGNOSIS — Z23 Encounter for immunization: Secondary | ICD-10-CM | POA: Diagnosis not present

## 2022-07-09 MED ORDER — MUPIROCIN 2 % EX OINT: 1.0000 | TOPICAL_OINTMENT | Freq: Two times a day (BID) | CUTANEOUS | 0 refills | Status: DC

## 2022-07-09 MED ORDER — TETANUS-DIPHTH-ACELL PERTUSSIS 5-2.5-18.5 LF-MCG/0.5 IM SUSY
0.5000 mL | PREFILLED_SYRINGE | Freq: Once | INTRAMUSCULAR | Status: AC
  Administered 2022-07-09: 0.5 mL via INTRAMUSCULAR

## 2022-07-09 NOTE — Telephone Encounter (Signed)
noted 

## 2022-07-09 NOTE — Telephone Encounter (Signed)
I spoke with pt; pt said she cleaned area and dressed with gauze last night; pt said it looks pretty bad but no drainage;pt last Tdap was 2017 per immunization record. pt scheduled appt at [facility] 07/09/22 at 2 PM with UC and ED precautions and pt voiced understanding and was appreciative of call.  Sending note to Romilda Garret NP.

## 2022-07-09 NOTE — ED Provider Notes (Signed)
Danielle Strickland    CSN: [id number] Arrival date & time: 07/09/22  1346      History   Chief Complaint Chief Complaint  Patient presents with   Laceration    Entered by patient    HPI Danielle Strickland is a 56 y.o. female.  Patient presents with a laceration on her left foot that occurred yesterday evening at [facility].  She scraped her foot on a metal shelf.  Bleeding controlled with direct pressure at the time; No bleeding since.  No wound drainage, fever, chills, numbness, weakness, paresthesias, or other symptoms.  Treatment at home with Vaseline.  Her medical history includes emphysema, migraine headaches, GERD, IBS, anxiety, PTSD, complex regional pain syndrome, restless leg syndrome.  Last tetanus 2017.  The history is provided by the patient and medical records.    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   History of migraine    HPV in female    IBS (irritable bowel syndrome)    Liver disease    H/O hepatitis   Mental disorder    anxiety   Microscopic hematuria    Migraines    Peptic ulcer disease    Skin cancer    left thigh   Tobacco abuse 02/21/2013    Patient Active Problem List   Diagnosis Date Noted   Canker sore 02/23/2022   Bilateral lower extremity edema 02/23/2022   Centrilobular emphysema ([place]) 02/11/2022   Other fatigue 12/31/2021   Genital herpes simplex 12/31/2021   Morbid obesity ([place]) 12/31/2021   Screening for lung cancer 12/31/2021   Polyp of sigmoid colon    Elevated liver enzymes 06/18/2021   Vitamin D deficiency 03/28/2021   Aortic atherosclerosis ([place]) 01/25/2021   Degenerative disc disease, cervical 01/25/2021   IFG (impaired fasting glucose) 04/04/2020   Preventative health care 11/20/2019   COVID-19 virus infection 10/30/2019   SOB  (shortness of breath) 08/09/2018   Palpitation 07/22/2018   Hypercholesterolemia 05/10/2018   Elevated TSH 05/10/2018   Restless legs syndrome (RLS) 04/06/2018   Anxiety 02/01/2018   Gastroesophageal reflux disease 02/01/2018   Inflammatory disease of liver 02/01/2018   Menopausal syndrome 02/01/2018   Migraine 02/01/2018   Recurrent hematuria 02/01/2018   Complex regional pain syndrome type 1 affecting right lower leg 09/03/2016   PTSD (post-traumatic stress disorder) 03/02/2016   Malleolar fracture 12/10/2015   Hx MRSA infection 06/18/2015   Abnormal chest x-ray 05/29/2015   Light headedness 08/20/2014   Family history of colon cancer 02/21/2013   TMJ (dislocation of temporomandibular joint) 02/21/2013   Allergy    Skin cancer    Peptic ulcer disease    Generalized anxiety disorder    Herpes simplex without mention of complication    IBS (irritable bowel syndrome)    History of migraine     Past Surgical History:  Procedure Laterality Date   ABDOMINAL HYSTERECTOMY     APPENDECTOMY     CHOLECYSTECTOMY N/A 11/26/2015   Procedure: LAPAROSCOPIC CHOLECYSTECTOMY ;  Surgeon: Jules Husbands, MD;  Location: [facility] ORS;  Service: General;  Laterality: N/A;   COLONOSCOPY WITH PROPOFOL N/A 12/09/2021   Procedure: COLONOSCOPY WITH PROPOFOL;  Surgeon: Lucilla Lame, MD;  Location: [facility] ENDOSCOPY;  Service: Endoscopy;  Laterality: N/A;  laparoscopically assisted vaginal hysterectomy  2006   SALIVARY GLAND SURGERY     submandular     TUBAL LIGATION     WISDOM TOOTH EXTRACTION      OB History     Gravida  3   Para  3   Term  3   Preterm      AB      Living  3      SAB      IAB      Ectopic      Multiple      Live Births  3            Home Medications    Prior to Admission medications   Medication Sig Start Date End Date Taking? Authorizing Provider  mupirocin ointment (BACTROBAN) 2 % Apply 1 Application topically 2 (two) times daily. 07/09/22  Yes Sharion Balloon, NP  acyclovir (ZOVIRAX) 200 MG capsule TAKE 2 CAPSULES BY MOUTH 2 TO 3 TIMES DAILY FOR 5 DAYS AS NEEDED 07/03/22   Michela Pitcher, NP  albuterol (VENTOLIN HFA) 108 (90 Base) MCG/ACT inhaler Inhale 2 puffs into the lungs every 4 (four) hours as needed for wheezing or shortness of breath. 12/31/21   Michela Pitcher, NP  Budeson-Glycopyrrol-Formoterol (BREZTRI AEROSPHERE) 160-9-4.8 MCG/ACT AERO Inhale 2 puffs into the lungs in the morning and at bedtime. 06/18/22   Tyler Pita, MD  citalopram (CELEXA) 10 MG tablet Take 1.5 tablets (15 mg total) by mouth daily. 07/03/22   Michela Pitcher, NP  desonide (DESOWEN) 0.05 % ointment As needed 10/23/21   [provider]  fexofenadine (ALLEGRA) 180 MG tablet Take 180 mg by mouth daily. Over the counter.    [provider]  hydrOXYzine (ATARAX) 10 MG tablet Take 1 tablet (10 mg total) by mouth 2 (two) times daily as needed. Patient not taking: Reported on 04/30/2022 02/19/22   Michela Pitcher, NP  meclizine (ANTIVERT) 12.5 MG tablet Take 1 tablet (12.5 mg total) by mouth daily as needed for dizziness. Patient not taking: Reported on 04/30/2022 02/23/22   Michela Pitcher, NP  meloxicam (MOBIC) 15 MG tablet Take 1 tablet (15 mg total) by mouth daily. 04/14/22   Copland, Frederico Hamman, MD  pantoprazole (PROTONIX) 40 MG tablet Take 1 tablet (40 mg total) by mouth daily. 11/20/21   Lucilla Lame, MD  promethazine (PHENERGAN) 25 MG tablet Take 1q8h prn N&V with Migraines 03/05/22   Michela Pitcher, NP  simvastatin (ZOCOR) 20 MG tablet Take 1 tablet (20 mg total) by mouth at bedtime. 01/02/22   Michela Pitcher, NP  SUMAtriptan (IMITREX) 50 MG tablet Take 1 tablet at onset of migraine, may repeat in 2 hours if needed. No more than 2 doses in 24 hours 03/05/22   Michela Pitcher, NP  triamcinolone (KENALOG) 0.1 % paste Use as directed 1 application. in the mouth or throat 2 (two) times daily. 03/03/22   Michela Pitcher, NP    Family History Family History  Problem Relation  Age of Onset   Cancer Mother        colon    Hyperlipidemia Sister    Thyroid cancer Maternal Aunt    Breast cancer Maternal Aunt    Cancer Paternal Aunt        colon   Breast cancer Maternal Grandmother     Social History Social History   Tobacco Use   Smoking status: Former    Packs/day: 1.00  Years: 32.00    Total pack years: 32.00    Types: Cigarettes    Quit date: 03/03/2015    Years since quitting: 7.3   Smokeless tobacco: Never  Substance Use Topics   Alcohol use: No    Alcohol/week: 0.0 standard drinks of alcohol   Drug use: No     Allergies   Amoxicillin-pot clavulanate, Latex, Aspirin, and Neosporin [neomycin-bacitracin zn-polymyx]   Review of Systems Review of Systems  Constitutional:  Negative for chills and fever.  Musculoskeletal:  Negative for arthralgias and gait problem.  Skin:  Positive for wound. Negative for color change.  Neurological:  Negative for weakness and numbness.  All other systems reviewed and are negative.    Physical Exam Triage Vital Signs ED Triage Vitals  Enc Vitals Group     BP      Pulse      Resp      Temp      Temp src      SpO2      Weight      Height      Head Circumference      Peak Flow      Pain Score      Pain Loc      Pain Edu?      Excl. in [place]?    No data found.  Updated Vital Signs BP 114/72 (BP Location: Left Arm)   Pulse 89   Temp 97.9 F (36.6 C) (Temporal)   Resp 16   SpO2 95%   Visual Acuity Right Eye Distance:   Left Eye Distance:   Bilateral Distance:    Right Eye Near:   Left Eye Near:    Bilateral Near:     Physical Exam Vitals and nursing note reviewed.  Constitutional:      General: She is not in acute distress.    Appearance: Normal appearance. She is well-developed. She is not ill-appearing.  HENT:     Mouth/Throat:     Mouth: Mucous membranes are moist.  Cardiovascular:     Rate and Rhythm: Normal rate and regular rhythm.     Heart sounds: Normal heart sounds.   Pulmonary:     Effort: Pulmonary effort is normal. No respiratory distress.     Breath sounds: Normal breath sounds.  Musculoskeletal:        General: No deformity. Normal range of motion.     Cervical back: Neck supple.  Skin:    General: Skin is warm and dry.     Capillary Refill: Capillary refill takes less than 2 seconds.     Findings: Lesion present.     Comments: Superficial laceration on top of left foot.  No active bleeding or drainage.  See picture for details.  Neurological:     General: No focal deficit present.     Mental Status: She is alert and oriented to person, place, and time.     Sensory: No sensory deficit.     Motor: No weakness.     Gait: Gait normal.  Psychiatric:        Mood and Affect: Mood normal.        Behavior: Behavior normal.       UC Treatments / Results  Labs (all labs ordered are listed, but only abnormal results are displayed) Labs Reviewed - No data to display  EKG   Radiology No results found.  Procedures Procedures (including critical care time)  Medications Ordered in UC Medications  Tdap (  BOOSTRIX) injection 0.5 mL (0.5 mLs Intramuscular Given 07/09/22 1403)    Initial Impression / Assessment and Plan / UC Course  I have reviewed the triage vital signs and the nursing notes.  Pertinent labs & imaging results that were available during my care of the patient were reviewed by me and considered in my medical decision making (see chart for details).   Laceration of left foot.  Afebrile and vital signs are stable.  Tetanus updated today.  Treating with mupirocin ointment.  Wound care instructions and signs of infection discussed with patient.  Instructed her to follow-up right away if she notes signs of infection.  Education provided on wound care.  Patient agrees to plan of care.   Final Clinical Impressions(s) / UC Diagnoses   Final diagnoses:  Laceration of left foot, initial encounter     Discharge Instructions       Your tetanus was updated today.    Keep your wound clean and dry.  Wash it gently twice a day with soap and water.  Apply the antibiotic ointment and a bandage twice a day.    Follow up if you see signs of infection, such as redness, pus-like drainage, warmth, fever, chills, or other concerning symptoms.        ED Prescriptions     Medication Sig Dispense Auth. Provider   mupirocin ointment (BACTROBAN) 2 % Apply 1 Application topically 2 (two) times daily. 22 g Sharion Balloon, NP      PDMP not reviewed this encounter.   Sharion Balloon, NP 07/09/22 1427

## 2022-07-09 NOTE — Discharge Instructions (Addendum)
Your tetanus was updated today.    Keep your wound clean and dry.  Wash it gently twice a day with soap and water.  Apply the antibiotic ointment and a bandage twice a day.    Follow up if you see signs of infection, such as redness, pus-like drainage, warmth, fever, chills, or other concerning symptoms.

## 2022-07-09 NOTE — ED Triage Notes (Signed)
Patietn presents to UC for a laceration to her left foot yesterday from scraping her foot at [place]. Last Tdap 2017.

## 2022-07-14 ENCOUNTER — Ambulatory Visit (INDEPENDENT_AMBULATORY_CARE_PROVIDER_SITE_OTHER): Payer: Commercial Managed Care - HMO

## 2022-07-14 ENCOUNTER — Ambulatory Visit
Admission: RE | Admit: 2022-07-14 | Discharge: 2022-07-14 | Disposition: A | Discharge status: Home / Self Care | Payer: Commercial Managed Care - HMO | Source: Ambulatory Visit | Attending: Emergency Medicine | Admitting: Emergency Medicine

## 2022-07-14 VITALS — BP 124/72 | HR 93 | Temp 97.9°F | Resp 18 | Ht 63.5 in | Wt 190.0 lb

## 2022-07-14 DIAGNOSIS — S91312D Laceration without foreign body, left foot, subsequent encounter: Secondary | ICD-10-CM | POA: Diagnosis not present

## 2022-07-14 DIAGNOSIS — S9032XA Contusion of left foot, initial encounter: Secondary | ICD-10-CM | POA: Diagnosis not present

## 2022-07-14 DIAGNOSIS — M79672 Pain in left foot: Secondary | ICD-10-CM

## 2022-07-14 MED ORDER — CEPHALEXIN 500 MG PO CAPS: 500.0000 mg | ORAL_CAPSULE | Freq: Four times a day (QID) | ORAL | 0 refills | Status: DC

## 2022-07-14 NOTE — ED Triage Notes (Signed)
Patient to Urgent Care for a follow up appointment reports injury to foot 9/14. Reports foot laceration is bright red on both sides, continued pain. Also reports new bruising behind her toes.

## 2022-07-14 NOTE — Discharge Instructions (Addendum)
The x-ray of your foot is negative.  Continue to use mupirocin ointment as directed.  Take the cephalexin as directed.  Follow up with your primary care provider.

## 2022-07-14 NOTE — ED Provider Notes (Signed)
[name]    CSN: [id number] Arrival date & time: 07/14/22  1301      History   Chief Complaint Chief Complaint  Patient presents with   Foot Injury    Follow up from 07/09/22 appt.  Foot Laceration still in pain and bright red on both sides of Laceration.   I need to make sure there is no infection.  I also have bruising behind 3 middle toes above Laceration. - Entered by patient    HPI Danielle Strickland is a 56 y.o. female.  Patient presents with increased pain left foot from an injury that occurred on 07/09/2022 when she cut her foot on a shelf in [place].  She states the area around the laceration on her foot has gotten more red.  She has noticed bruising at the bottom of her toes.  No wound drainage, red streaks, fever, chills, numbness, weakness, or other symptoms.  She has been using the mupirocin ointment.  Patient was seen here on 07/09/2022 for a laceration on her left foot; treated with mupirocin ointment; tetanus updated at that visit.  The history is provided by the patient and medical records.    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   History of migraine    HPV in female    IBS (irritable bowel syndrome)    Liver disease    H/O hepatitis   Mental disorder    anxiety   Microscopic hematuria    Migraines    Peptic ulcer disease    Skin cancer    left thigh   Tobacco abuse 02/21/2013    Patient Active Problem List   Diagnosis Date Noted   Canker sore 02/23/2022   Bilateral lower extremity edema 02/23/2022   Centrilobular emphysema ([place]) 02/11/2022   Other fatigue 12/31/2021   Genital herpes simplex 12/31/2021   Morbid obesity ([place]) 12/31/2021   Screening for lung cancer 12/31/2021   Polyp of sigmoid colon    Elevated liver enzymes 06/18/2021   Vitamin D  deficiency 03/28/2021   Aortic atherosclerosis ([place]) 01/25/2021   Degenerative disc disease, cervical 01/25/2021   IFG (impaired fasting glucose) 04/04/2020   Preventative health care 11/20/2019   COVID-19 virus infection 10/30/2019   SOB (shortness of breath) 08/09/2018   Palpitation 07/22/2018   Hypercholesterolemia 05/10/2018   Elevated TSH 05/10/2018   Restless legs syndrome (RLS) 04/06/2018   Anxiety 02/01/2018   Gastroesophageal reflux disease 02/01/2018   Inflammatory disease of liver 02/01/2018   Menopausal syndrome 02/01/2018   Migraine 02/01/2018   Recurrent hematuria 02/01/2018   Complex regional pain syndrome type 1 affecting right lower leg 09/03/2016   PTSD (post-traumatic stress disorder) 03/02/2016   Malleolar fracture 12/10/2015   Hx MRSA infection 06/18/2015   Abnormal chest x-ray 05/29/2015   Light headedness 08/20/2014   Family history of colon cancer 02/21/2013   TMJ (dislocation of temporomandibular joint) 02/21/2013   Allergy    Skin cancer    Peptic ulcer disease    Generalized anxiety disorder    Herpes simplex without mention of complication    IBS (irritable bowel syndrome)    History of migraine     Past Surgical History:  Procedure Laterality Date   ABDOMINAL HYSTERECTOMY  APPENDECTOMY     CHOLECYSTECTOMY N/A 11/26/2015   Procedure: LAPAROSCOPIC CHOLECYSTECTOMY ;  Surgeon: Jules Husbands, MD;  Location: [facility] ORS;  Service: General;  Laterality: N/A;   COLONOSCOPY WITH PROPOFOL N/A 12/09/2021   Procedure: COLONOSCOPY WITH PROPOFOL;  Surgeon: Lucilla Lame, MD;  Location: [facility] ENDOSCOPY;  Service: Endoscopy;  Laterality: N/A;   laparoscopically assisted vaginal hysterectomy  2006   SALIVARY GLAND SURGERY     submandular     TUBAL LIGATION     WISDOM TOOTH EXTRACTION      OB History     Gravida  3   Para  3   Term  3   Preterm      AB      Living  3      SAB      IAB      Ectopic      Multiple      Live Births  3             Home Medications    Prior to Admission medications   Medication Sig Start Date End Date Taking? Authorizing Provider  cephALEXin (KEFLEX) 500 MG capsule Take 1 capsule (500 mg total) by mouth 4 (four) times daily. 07/14/22  Yes Sharion Balloon, NP  acyclovir (ZOVIRAX) 200 MG capsule TAKE 2 CAPSULES BY MOUTH 2 TO 3 TIMES DAILY FOR 5 DAYS AS NEEDED 07/03/22   Michela Pitcher, NP  albuterol (VENTOLIN HFA) 108 (90 Base) MCG/ACT inhaler Inhale 2 puffs into the lungs every 4 (four) hours as needed for wheezing or shortness of breath. 12/31/21   Michela Pitcher, NP  Budeson-Glycopyrrol-Formoterol (BREZTRI AEROSPHERE) 160-9-4.8 MCG/ACT AERO Inhale 2 puffs into the lungs in the morning and at bedtime. 06/18/22   Tyler Pita, MD  citalopram (CELEXA) 10 MG tablet Take 1.5 tablets (15 mg total) by mouth daily. 07/03/22   Michela Pitcher, NP  desonide (DESOWEN) 0.05 % ointment As needed 10/23/21   [provider]  fexofenadine (ALLEGRA) 180 MG tablet Take 180 mg by mouth daily. Over the counter.    [provider]  hydrOXYzine (ATARAX) 10 MG tablet Take 1 tablet (10 mg total) by mouth 2 (two) times daily as needed. Patient not taking: Reported on 04/30/2022 02/19/22   Michela Pitcher, NP  meclizine (ANTIVERT) 12.5 MG tablet Take 1 tablet (12.5 mg total) by mouth daily as needed for dizziness. Patient not taking: Reported on 04/30/2022 02/23/22   Michela Pitcher, NP  meloxicam (MOBIC) 15 MG tablet Take 1 tablet (15 mg total) by mouth daily. 04/14/22   Copland, Frederico Hamman, MD  mupirocin ointment (BACTROBAN) 2 % Apply 1 Application topically 2 (two) times daily. 07/09/22   Sharion Balloon, NP  pantoprazole (PROTONIX) 40 MG tablet Take 1 tablet (40 mg total) by mouth daily. 11/20/21   Lucilla Lame, MD  promethazine (PHENERGAN) 25 MG tablet Take 1q8h prn N&V with Migraines 03/05/22   Michela Pitcher, NP  simvastatin (ZOCOR) 20 MG tablet Take 1 tablet (20 mg total) by mouth at bedtime. 01/02/22   Michela Pitcher, NP  SUMAtriptan (IMITREX) 50 MG tablet Take 1 tablet at onset of migraine, may repeat in 2 hours if needed. No more than 2 doses in 24 hours 03/05/22   Michela Pitcher, NP  triamcinolone (KENALOG) 0.1 % paste Use as directed 1 application. in the mouth or throat 2 (two) times daily. 03/03/22   Michela Pitcher, NP  Family History Family History  Problem Relation Age of Onset   Cancer Mother        colon    Hyperlipidemia Sister    Thyroid cancer Maternal Aunt    Breast cancer Maternal Aunt    Cancer Paternal Aunt        colon   Breast cancer Maternal Grandmother     Social History Social History   Tobacco Use   Smoking status: Former    Packs/day: 1.00    Years: 32.00    Total pack years: 32.00    Types: Cigarettes    Quit date: 03/03/2015    Years since quitting: 7.3   Smokeless tobacco: Never  Substance Use Topics   Alcohol use: No    Alcohol/week: 0.0 standard drinks of alcohol   Drug use: No     Allergies   Amoxicillin-pot clavulanate, Latex, Aspirin, and Neosporin [neomycin-bacitracin zn-polymyx]   Review of Systems Review of Systems  Constitutional:  Negative for chills and fever.  Musculoskeletal:  Positive for arthralgias. Negative for gait problem and joint swelling.  Skin:  Positive for color change and wound.  Neurological:  Negative for weakness and numbness.  All other systems reviewed and are negative.    Physical Exam Triage Vital Signs ED Triage Vitals [07/14/22 1315]  Enc Vitals Group     BP 124/72     Pulse Rate 93     Resp 18     Temp 97.9 F (36.6 C)     Temp src      SpO2 96 %     Weight      Height      Head Circumference      Peak Flow      Pain Score      Pain Loc      Pain Edu?      Excl. in [place]?    No data found.  Updated Vital Signs BP 124/72   Pulse 93   Temp 97.9 F (36.6 C)   Resp 18   Ht 5' 3.5" (1.613 m)   Wt 190 lb (86.2 kg)   SpO2 96%   BMI 33.13 kg/m   Visual Acuity Right Eye Distance:   Left Eye  Distance:   Bilateral Distance:    Right Eye Near:   Left Eye Near:    Bilateral Near:     Physical Exam Vitals and nursing note reviewed.  Constitutional:      General: She is not in acute distress.    Appearance: Normal appearance. She is well-developed. She is not ill-appearing.  HENT:     Mouth/Throat:     Mouth: Mucous membranes are moist.  Cardiovascular:     Rate and Rhythm: Normal rate and regular rhythm.     Heart sounds: Normal heart sounds.  Pulmonary:     Effort: Pulmonary effort is normal. No respiratory distress.     Breath sounds: Normal breath sounds.  Musculoskeletal:        General: Tenderness present. No swelling or deformity. Normal range of motion.     Cervical back: Neck supple.       Feet:  Feet:     Comments: Laceration appears to be healing well.  Ecchymosis noted at base of second and third toes.  See pictures for details Skin:    General: Skin is warm and dry.     Capillary Refill: Capillary refill takes less than 2 seconds.     Findings: Bruising, erythema and  lesion present.  Neurological:     General: No focal deficit present.     Mental Status: She is alert and oriented to person, place, and time.     Sensory: No sensory deficit.     Motor: No weakness.     Gait: Gait normal.  Psychiatric:        Mood and Affect: Mood normal.        Behavior: Behavior normal.         UC Treatments / Results  Labs (all labs ordered are listed, but only abnormal results are displayed) Labs Reviewed - No data to display  EKG   Radiology DG Foot Complete Left  Result Date: 07/14/2022 CLINICAL DATA:  pain and bruising s/p injury EXAM: LEFT FOOT - COMPLETE 3+ VIEW COMPARISON:  None Available. FINDINGS: There is no evidence of acute fracture. Alignment is normal. There is mild midfoot degenerative change. Os peroneum. Mild osteopenia. IMPRESSION: No acute osseous abnormality. Electronically Signed   By: Maurine Simmering M.D.   On: 07/14/2022 14:07     Procedures Procedures (including critical care time)  Medications Ordered in UC Medications - No data to display  Initial Impression / Assessment and Plan / UC Course  I have reviewed the triage vital signs and the nursing notes.  Pertinent labs & imaging results that were available during my care of the patient were reviewed by me and considered in my medical decision making (see chart for details).   Left foot pain, contusion of left foot, laceration of left foot.  X-ray negative.  Treating with cephalexin.  Instructed patient to continue wound care and use of mupirocin ointment.  Education provided on foot contusion and on laceration care.  Instructed patient to follow-up with her PCP.  She agrees to plan of care.   Final Clinical Impressions(s) / UC Diagnoses   Final diagnoses:  Left foot pain  Laceration of left foot, subsequent encounter  Contusion of left foot, initial encounter     Discharge Instructions      The x-ray of your foot is negative.  Continue to use mupirocin ointment as directed.  Take the cephalexin as directed.  Follow up with your primary care provider.        ED Prescriptions     Medication Sig Dispense Auth. Provider   cephALEXin (KEFLEX) 500 MG capsule Take 1 capsule (500 mg total) by mouth 4 (four) times daily. 28 capsule Sharion Balloon, NP      PDMP not reviewed this encounter.   Sharion Balloon, NP 07/14/22 1422

## 2022-07-15 ENCOUNTER — Telehealth: Payer: Self-pay | Admitting: Urgent Care

## 2022-07-15 DIAGNOSIS — B3731 Acute candidiasis of vulva and vagina: Secondary | ICD-10-CM

## 2022-07-15 MED ORDER — FLUCONAZOLE 150 MG PO TABS: 150.0000 mg | ORAL_TABLET | Freq: Once | ORAL | 0 refills | Status: AC

## 2022-07-15 NOTE — Telephone Encounter (Signed)
Patient was treated at [facility] yesterday for left foot pain.  Found to have laceration of her left foot and treated with cephalexin x7 days.  Called clinic today to request prescription for Diflucan for frequent yeast infection while taking antibiotics.

## 2022-07-16 ENCOUNTER — Encounter: Payer: Self-pay | Admitting: Pulmonary Disease

## 2022-07-16 ENCOUNTER — Ambulatory Visit: Payer: Commercial Managed Care - HMO | Admitting: Pulmonary Disease

## 2022-07-16 VITALS — BP 132/78 | HR 91 | Temp 97.9°F | Ht 63.5 in | Wt 192.4 lb

## 2022-07-16 DIAGNOSIS — G4733 Obstructive sleep apnea (adult) (pediatric): Secondary | ICD-10-CM | POA: Diagnosis not present

## 2022-07-16 DIAGNOSIS — J4540 Moderate persistent asthma, uncomplicated: Secondary | ICD-10-CM

## 2022-07-16 MED ORDER — ARNUITY ELLIPTA 100 MCG/ACT IN AEPB: 1.0000 | INHALATION_SPRAY | Freq: Every day | RESPIRATORY_TRACT | 6 refills | Status: DC

## 2022-07-16 NOTE — Progress Notes (Signed)
Subjective:    Patient ID: Danielle Strickland, female    DOB: 27-Sep-1966, 56 y.o.   MRN: [id number] Patient Care Team: Michela Pitcher, NP as PCP - General (Pain Medicine)  Chief Complaint  Patient presents with   Follow-up    SOB with exertion. Wearing cpap nightly-pressure and mask is okay.    HPI Patient is a 56 year old former smoker (32 pack years) who follows up for the issue of dyspnea present for over a years duration.  The was initially seen on 30 April 2022.  Dyspnea mostly on overexertion and carrying heavy things.  On lung cancer screening she was noted to have significant centrilobular and paraseptal emphysema.  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  She has a very strong asthmatic component.  Prior to this visit she had multiple inhalers tried but she had difficulties due to tachypalpitations, tremor etc. with LAMA/ICS/LABA inhalers.  She was also diagnosed with obstructive sleep apnea.  She is currently on CPAP (auto CPAP 5 to 15 cm H2O) the patient shows excellent compliance of 100% over 30 days with AHI controlled at 1.0.  He does not endorse any cough, sputum production or hemoptysis.  She has shortness of breath with exertion as prior.  No chest pain.  No lower extremity edema no calf tenderness.  Does not endorse any other symptomatology.  DATA  05/01/2022 PFTs: FEV1 was 1.39 L or 54% predicted FVC 2.82 L or 85% predicted, FEV1/FVC 49%.  Postbronchodilator patient had dramatic response with FEV1 normalizing to 2.33 L or 91% predicted for a net change of 68%.  Diffusion capacity normal.  More consistent with asthma than COPD. 05/16/2022 Home sleep study: Mild obstructive sleep apnea AHI 8.9/h, significant O2 desaturations to 83% 05/28/2022 echocardiogram: LVEF 60 to 65%.  Grade 1 DD.  Review of Systems A 10 point review of systems was performed and it is as noted above otherwise negative.  Patient Active Problem List   Diagnosis Date Noted   Canker sore 02/23/2022    Bilateral lower extremity edema 02/23/2022   Centrilobular emphysema ([place]) 02/11/2022   Other fatigue 12/31/2021   Genital herpes simplex 12/31/2021   Morbid obesity ([place]) 12/31/2021   Screening for lung cancer 12/31/2021   Polyp of sigmoid colon    Elevated liver enzymes 06/18/2021   Vitamin D deficiency 03/28/2021   Aortic atherosclerosis ([place]) 01/25/2021   Degenerative disc disease, cervical 01/25/2021   IFG (impaired fasting glucose) 04/04/2020   Preventative health care 11/20/2019   COVID-19 virus infection 10/30/2019   SOB (shortness of breath) 08/09/2018   Palpitation 07/22/2018   Hypercholesterolemia 05/10/2018   Elevated TSH 05/10/2018   Restless legs syndrome (RLS) 04/06/2018   Anxiety 02/01/2018   Gastroesophageal reflux disease 02/01/2018   Inflammatory disease of liver 02/01/2018   Menopausal syndrome 02/01/2018   Migraine 02/01/2018   Recurrent hematuria 02/01/2018   Complex regional pain syndrome type 1 affecting right lower leg 09/03/2016   PTSD (post-traumatic stress disorder) 03/02/2016   Malleolar fracture 12/10/2015   Hx MRSA infection 06/18/2015   Abnormal chest x-ray 05/29/2015   Light headedness 08/20/2014   Family history of colon cancer 02/21/2013   TMJ (dislocation of temporomandibular joint) 02/21/2013   Allergy    Skin cancer    Peptic ulcer disease    Generalized anxiety disorder    Herpes simplex without mention of complication    IBS (irritable bowel syndrome)    History of migraine    Social History   Tobacco  Use   Smoking status: Former    Packs/day: 1.00    Years: 32.00    Total pack years: 32.00    Types: Cigarettes    Quit date: 03/03/2015    Years since quitting: 7.3   Smokeless tobacco: Never  Substance Use Topics   Alcohol use: No    Alcohol/week: 0.0 standard drinks of alcohol   Allergies  Allergen Reactions   Amoxicillin-Pot Clavulanate Nausea And Vomiting and Other (See Comments)    Has patient had a PCN reaction  causing immediate rash, facial/tongue/throat swelling, SOB or lightheadedness with hypotension: No Has patient had a PCN reaction causing severe rash involving mucus membranes or skin necrosis: No Has patient had a PCN reaction that required hospitalization No Has patient had a PCN reaction occurring within the last 10 years: No If all of the above answers are "NO", then may proceed with Cephalosporin use. Other reaction(s): Vomiting   Latex Rash   Aspirin Nausea And Vomiting   Neosporin [Neomycin-Bacitracin Zn-Polymyx]    Current Meds  Medication Sig   acyclovir (ZOVIRAX) 200 MG capsule TAKE 2 CAPSULES BY MOUTH 2 TO 3 TIMES DAILY FOR 5 DAYS AS NEEDED   albuterol (VENTOLIN HFA) 108 (90 Base) MCG/ACT inhaler Inhale 2 puffs into the lungs every 4 (four) hours as needed for wheezing or shortness of breath.   cephALEXin (KEFLEX) 500 MG capsule Take 1 capsule (500 mg total) by mouth 4 (four) times daily.   citalopram (CELEXA) 10 MG tablet Take 1.5 tablets (15 mg total) by mouth daily.   desonide (DESOWEN) 0.05 % ointment As needed   fexofenadine (ALLEGRA) 180 MG tablet Take 180 mg by mouth daily. Over the counter.   Fluticasone Furoate (ARNUITY ELLIPTA) 100 MCG/ACT AEPB Inhale 1 puff into the lungs daily.   hydrOXYzine (ATARAX) 10 MG tablet Take 1 tablet (10 mg total) by mouth 2 (two) times daily as needed.   meclizine (ANTIVERT) 12.5 MG tablet Take 1 tablet (12.5 mg total) by mouth daily as needed for dizziness.   meloxicam (MOBIC) 15 MG tablet Take 1 tablet (15 mg total) by mouth daily.   mupirocin ointment (BACTROBAN) 2 % Apply 1 Application topically 2 (two) times daily.   pantoprazole (PROTONIX) 40 MG tablet Take 1 tablet (40 mg total) by mouth daily.   promethazine (PHENERGAN) 25 MG tablet Take 1q8h prn N&V with Migraines   simvastatin (ZOCOR) 20 MG tablet Take 1 tablet (20 mg total) by mouth at bedtime.   SUMAtriptan (IMITREX) 50 MG tablet Take 1 tablet at onset of migraine, may repeat in  2 hours if needed. No more than 2 doses in 24 hours   triamcinolone (KENALOG) 0.1 % paste Use as directed 1 application. in the mouth or throat 2 (two) times daily.   [DISCONTINUED] Budeson-Glycopyrrol-Formoterol (BREZTRI AEROSPHERE) 160-9-4.8 MCG/ACT AERO Inhale 2 puffs into the lungs in the morning and at bedtime.   Immunization History  Administered Date(s) Administered   PFIZER Comirnaty(Gray Top)Covid-19 Tri-Sucrose Vaccine 06/06/2020, 06/28/2020   Tdap 05/23/2016, 07/09/2022       Objective:   Physical Exam BP 132/78 (BP Location: Left Arm, Cuff Size: Normal)   Pulse 91   Temp 97.9 F (36.6 C) (Temporal)   Ht 5' 3.5" (1.613 m)   Wt 192 lb 6.4 oz (87.3 kg)   SpO2 98%   BMI 33.55 kg/m  GENERAL: Obese woman, no acute distress, fully ambulatory, no conversational dyspnea. HEAD: Normocephalic, atraumatic.  EYES: Pupils equal, round, reactive to light.  No scleral icterus.  MOUTH: Oral mucosa moist.  Dentition intact. NECK: Supple. No thyromegaly. Trachea midline. No JVD.  No adenopathy. PULMONARY: Good air entry bilaterally.  No adventitious sounds. CARDIOVASCULAR: S1 and S2. Regular rate and rhythm.  No rubs, murmurs or gallops heard. ABDOMEN: Obese, otherwise benign. MUSCULOSKELETAL: No joint deformity, no clubbing, no edema.  NEUROLOGIC: No overt focal deficit, no gait disturbance, speech is fluent. SKIN: Intact,warm,dry. PSYCH: Mood and behavior normal.     Assessment & Plan:     ICD-10-CM   1. Moderate persistent asthma without complication  M41.58    Did not tolerate LABA/ICS//LAMA: Jitteriness, excessive phlegm We will give trial of ICS: Annuity Ellipta Continue as needed albuterol    2. OSA (obstructive sleep apnea)  G47.33    Continue CPAP Compliant with therapy Compliance report shows 100% compliance     Meds ordered this encounter  Medications   Fluticasone Furoate (ARNUITY ELLIPTA) 100 MCG/ACT AEPB    Sig: Inhale 1 puff into the lungs daily.     Dispense:  30 each    Refill:  6   We will see the patient in follow-up in 3 months time she is to contact us prior to that time should any new difficulties arise.   Renold Don, MD Advanced Bronchoscopy PCCM [facility]-    *This note was dictated using voice recognition software/Dragon.  Despite best efforts to proofread, errors can occur which can change the meaning. Any transcriptional errors that result from this process are unintentional and may not be fully corrected at the time of dictation.

## 2022-07-16 NOTE — Patient Instructions (Addendum)
Continue using your CPAP.  You will need to let [facility] now that we were not able to get a download.  They may need to give you a actual card to save the download.  I sent a prescription him a medication called Arnuity Ellipta this is for your asthma.  I think you have more asthma than COPD.  You may still use your albuterol if needed.  As we discussed you have a combination of asthma COPD but your asthma is the major component.  The COPD is the lesser component.  I am classifying you then, as moderate persistent asthma.  We will see you in follow-up in 3 months time call sooner should any new problems arise.

## 2022-07-17 ENCOUNTER — Encounter: Payer: Self-pay | Admitting: Pulmonary Disease

## 2022-08-11 ENCOUNTER — Encounter: Payer: Self-pay | Admitting: Pulmonary Disease

## 2022-08-11 NOTE — Telephone Encounter (Signed)
Dr. Gonzalez, please advise. Thanks 

## 2022-08-11 NOTE — Telephone Encounter (Signed)
She has had difficulties with medications similar to [facility].  However, we could give the Wixela at try to see if this will help.  We can give her a trial of Wixela 250/50, 1 puff twice a day to see if she can tolerate this.  She needs to rinse her mouth well after she uses it.

## 2022-08-12 MED ORDER — FLUTICASONE-SALMETEROL 250-50 MCG/ACT IN AEPB: 1.0000 | INHALATION_SPRAY | Freq: Two times a day (BID) | RESPIRATORY_TRACT | 2 refills | Status: DC

## 2022-08-13 ENCOUNTER — Other Ambulatory Visit: Payer: Self-pay | Admitting: Obstetrics and Gynecology

## 2022-08-13 DIAGNOSIS — Z1231 Encounter for screening mammogram for malignant neoplasm of breast: Secondary | ICD-10-CM

## 2022-08-26 ENCOUNTER — Encounter: Payer: Self-pay | Admitting: Family Medicine

## 2022-08-26 NOTE — Telephone Encounter (Signed)
I can do it at any time I am here.  I can also always sign it and send back in the mail to her.

## 2022-09-27 ENCOUNTER — Other Ambulatory Visit: Payer: Self-pay | Admitting: Family Medicine

## 2022-09-29 ENCOUNTER — Encounter: Payer: Self-pay | Admitting: Family Medicine

## 2022-10-01 ENCOUNTER — Ambulatory Visit: Payer: Commercial Managed Care - HMO | Admitting: Pulmonary Disease

## 2022-10-01 ENCOUNTER — Telehealth: Payer: Self-pay

## 2022-10-01 ENCOUNTER — Encounter: Payer: Self-pay | Admitting: Pulmonary Disease

## 2022-10-01 VITALS — BP 120/78 | HR 97 | Ht 63.5 in | Wt 195.4 lb

## 2022-10-01 DIAGNOSIS — J4489 Other specified chronic obstructive pulmonary disease: Secondary | ICD-10-CM | POA: Diagnosis not present

## 2022-10-01 DIAGNOSIS — G4733 Obstructive sleep apnea (adult) (pediatric): Secondary | ICD-10-CM | POA: Diagnosis not present

## 2022-10-01 DIAGNOSIS — J4540 Moderate persistent asthma, uncomplicated: Secondary | ICD-10-CM | POA: Diagnosis not present

## 2022-10-01 NOTE — Progress Notes (Signed)
Subjective:    Patient ID: Danielle Strickland, female    DOB: Jul 25, 1966, 56 y.o.   MRN: [id number] Patient Care Team: Eden Emms, NP as PCP - General (Pain Medicine)  Chief Complaint  Patient presents with   Follow-up    SOB and wheezing with exertion. No cough.    HPI Patient is a 56 year old former smoker (32 pack years) who follows up for the issue of dyspnea present for over a years duration.  The was was last seen on 16 July 2022.  Dyspnea mostly on overexertion and carrying heavy things.  On lung cancer screening she was noted to have significant centrilobular and paraseptal emphysema.  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  She has a very strong asthmatic component.  Prior to this visit she had multiple inhalers tried but she had difficulties due to tachypalpitations, tremor etc. with LAMA/ICS/LABA inhalers.  She noted that she developed headaches with Wixela.  She is currently on Arnuity.  She has been in diagnosed with obstructive sleep apnea.  She is currently on CPAP (auto CPAP 5 to 15 cm H2O) the patient shows excellent compliance of 100% over 30 days with AHI controlled at 1.1.   She does not endorse any cough, sputum production or hemoptysis.  She has shortness of breath with exertion as prior.  No chest pain.  No lower extremity edema no calf tenderness.  Does not endorse any other symptomatology.  DATA  05/01/2022 PFTs: FEV1 was 1.39 L or 54% predicted FVC 2.82 L or 85% predicted, FEV1/FVC 49%.  Postbronchodilator patient had dramatic response with FEV1 normalizing to 2.33 L or 91% predicted for a net change of 68%.  Diffusion capacity normal.  More consistent with asthma than COPD. 05/16/2022 Home sleep study: Mild obstructive sleep apnea AHI 8.9/h, significant O2 desaturations to 83%. 05/28/2022 echocardiogram: LVEF 60 to 65%.  Grade 1 DD.  Review of Systems A 10 point review of systems was performed and it is as noted above otherwise negative.  Patient  Active Problem List   Diagnosis Date Noted   Canker sore 02/23/2022   Bilateral lower extremity edema 02/23/2022   Centrilobular emphysema (HCC) 02/11/2022   Other fatigue 12/31/2021   Genital herpes simplex 12/31/2021   Morbid obesity (HCC) 12/31/2021   Screening for lung cancer 12/31/2021   Polyp of sigmoid colon    Elevated liver enzymes 06/18/2021   Vitamin D deficiency 03/28/2021   Aortic atherosclerosis (HCC) 01/25/2021   Degenerative disc disease, cervical 01/25/2021   IFG (impaired fasting glucose) 04/04/2020   Preventative health care 11/20/2019   COVID-19 virus infection 10/30/2019   SOB (shortness of breath) 08/09/2018   Palpitation 07/22/2018   Hypercholesterolemia 05/10/2018   Elevated TSH 05/10/2018   Restless legs syndrome (RLS) 04/06/2018   Anxiety 02/01/2018   Gastroesophageal reflux disease 02/01/2018   Inflammatory disease of liver 02/01/2018   Menopausal syndrome 02/01/2018   Migraine 02/01/2018   Recurrent hematuria 02/01/2018   Complex regional pain syndrome type 1 affecting right lower leg 09/03/2016   PTSD (post-traumatic stress disorder) 03/02/2016   Malleolar fracture 12/10/2015   Hx MRSA infection 06/18/2015   Abnormal chest x-ray 05/29/2015   Light headedness 08/20/2014   Family history of colon cancer 02/21/2013   TMJ (dislocation of temporomandibular joint) 02/21/2013   Allergy    Skin cancer    Peptic ulcer disease    Generalized anxiety disorder    Herpes simplex without mention of complication    IBS (irritable bowel  syndrome)    History of migraine    Social History   Tobacco Use   Smoking status: Former    Packs/day: 1.00    Years: 32.00    Total pack years: 32.00    Types: Cigarettes    Quit date: 03/03/2015    Years since quitting: 7.5   Smokeless tobacco: Never  Substance Use Topics   Alcohol use: No    Alcohol/week: 0.0 standard drinks of alcohol   Allergies  Allergen Reactions   Amoxicillin-Pot Clavulanate Nausea And  Vomiting and Other (See Comments)    Has patient had a PCN reaction causing immediate rash, facial/tongue/throat swelling, SOB or lightheadedness with hypotension: No Has patient had a PCN reaction causing severe rash involving mucus membranes or skin necrosis: No Has patient had a PCN reaction that required hospitalization No Has patient had a PCN reaction occurring within the last 10 years: No If all of the above answers are "NO", then may proceed with Cephalosporin use. Other reaction(s): Vomiting   Latex Rash   Aspirin Nausea And Vomiting   Neosporin [Neomycin-Bacitracin Zn-Polymyx]    Current Meds  Medication Sig   acyclovir (ZOVIRAX) 200 MG capsule TAKE 2 CAPSULES BY MOUTH 2 TO 3 TIMES DAILY FOR 5 DAYS AS NEEDED   albuterol (VENTOLIN HFA) 108 (90 Base) MCG/ACT inhaler Inhale 2 puffs into the lungs every 4 (four) hours as needed for wheezing or shortness of breath.   ARNUITY ELLIPTA 100 MCG/ACT AEPB Take 1 puff by mouth daily.   citalopram (CELEXA) 10 MG tablet Take 1.5 tablets (15 mg total) by mouth daily.   desonide (DESOWEN) 0.05 % ointment As needed   fexofenadine (ALLEGRA) 180 MG tablet Take 180 mg by mouth daily. Over the counter.   hydrOXYzine (ATARAX) 10 MG tablet Take 1 tablet (10 mg total) by mouth 2 (two) times daily as needed.   meloxicam (MOBIC) 15 MG tablet Take 1 tablet by mouth once daily   pantoprazole (PROTONIX) 40 MG tablet Take 1 tablet (40 mg total) by mouth daily.   promethazine (PHENERGAN) 25 MG tablet Take 1q8h prn N&V with Migraines   simvastatin (ZOCOR) 20 MG tablet Take 1 tablet (20 mg total) by mouth at bedtime.   SUMAtriptan (IMITREX) 50 MG tablet Take 1 tablet at onset of migraine, may repeat in 2 hours if needed. No more than 2 doses in 24 hours   Immunization History  Administered Date(s) Administered   PFIZER Comirnaty(Gray Top)Covid-19 Tri-Sucrose Vaccine 06/06/2020, 06/28/2020   Tdap 05/23/2016, 07/09/2022       Objective:   Physical Exam BP  120/78 (BP Location: Left Arm, Cuff Size: Large)   Pulse 97   Ht 5' 3.5" (1.613 m)   Wt 195 lb 6.4 oz (88.6 kg)   SpO2 94%   BMI 34.07 kg/m   SpO2: 94 % O2 Device: None (Room air)  GENERAL: Obese woman, no acute distress, fully ambulatory, no conversational dyspnea. HEAD: Normocephalic, atraumatic.  EYES: Pupils equal, round, reactive to light.  No scleral icterus.  MOUTH: Oral mucosa moist.  Dentition intact. NECK: Supple. No thyromegaly. Trachea midline. No JVD.  No adenopathy. PULMONARY: Good air entry bilaterally.  No adventitious sounds. CARDIOVASCULAR: S1 and S2. Regular rate and rhythm.  No rubs, murmurs or gallops heard. ABDOMEN: Obese, otherwise benign. MUSCULOSKELETAL: No joint deformity, no clubbing, no edema.  NEUROLOGIC: No overt focal deficit, no gait disturbance, speech is fluent. SKIN: Intact,warm,dry. PSYCH: Mood and behavior normal.      Assessment &  Plan:     ICD-10-CM   1. Moderate persistent asthma without complication  J45.40    Continue Arnuity Ellipta Continue as needed albuterol    2. Asthma-COPD overlap syndrome  J44.89    Does have overlap Emphysema noted on chest CT Major component is asthma    3. OSA (obstructive sleep apnea)  G47.33    Excellent compliance with CPAP     Will see the patient in follow-up in 3 months time she is to contact us prior to that time should any new problems arise.  Gailen Shelter, MD Advanced Bronchoscopy PCCM [facility]    *This note was dictated using voice recognition software/Dragon.  Despite best efforts to proofread, errors can occur which can change the meaning. Any transcriptional errors that result from this process are unintentional and may not be fully corrected at the time of dictation.

## 2022-10-01 NOTE — Telephone Encounter (Signed)
Dr. Patsey Berthold is wanting to know if you can find a cheaper alternative for her Corticosteroid inhaler, she is currently on Arnuity?   Dr. Patsey Berthold is also asking if Pulmicort for a nebulizer would be approved by her insurance?

## 2022-10-01 NOTE — Patient Instructions (Signed)
Continue using Arnuity and as needed albuterol.  We will see him in follow-up in 3 months time call sooner should any new problems arise.

## 2022-10-02 ENCOUNTER — Other Ambulatory Visit (HOSPITAL_COMMUNITY): Payer: Self-pay

## 2022-10-02 NOTE — Telephone Encounter (Signed)
I advised the patient during the visit that she needed to procure a different plan that would cover her medications.  She is investigating switching plans.

## 2022-10-02 NOTE — Telephone Encounter (Signed)
I notified the patient. Nothing further is needed. 

## 2022-10-02 NOTE — Telephone Encounter (Signed)
Per benefits investigation ICS Alvesco is also covered; other alternatives covered are Flovent Diskus, Flovent HFA, Asmanex HFA, Asmanex Twisthaler- these alternatives are seemingly more expensive than the Arnuity. Patient has a pending deductible that is also contributing to cost.

## 2022-10-05 ENCOUNTER — Encounter: Payer: Self-pay | Admitting: Pulmonary Disease

## 2022-10-05 ENCOUNTER — Encounter: Payer: Self-pay | Admitting: Nurse Practitioner

## 2022-10-05 MED ORDER — BENZONATATE 100 MG PO CAPS: 100.0000 mg | ORAL_CAPSULE | Freq: Two times a day (BID) | ORAL | 0 refills | Status: AC

## 2022-10-05 NOTE — Telephone Encounter (Signed)
Routing to Dr. Gonzalez as an FYI 

## 2022-10-05 NOTE — Telephone Encounter (Signed)
This will serve as a note on the chart.

## 2022-10-07 ENCOUNTER — Encounter: Payer: Self-pay | Admitting: Pulmonary Disease

## 2022-10-07 NOTE — Telephone Encounter (Addendum)
I recommend she get some Nasonex over-the-counter and use 1 spray to each nostril twice a day.  Instead of Allegra she should try Zyrtec at bedtime (she can get this over-the-counter as well).  She should also make sure that her machines humidity is set to a medium level and not too warm nor too cold.  She can experiment with that to see what helps her congestion better.  Some people like it warmer some people like it cooler.  She does not need to call every time she cannot use the machine what I recommend is that she start a diary and write down the days that she has difficulty using the machine that way when she comes for follow-up and we pull her compliance report we can have an explanation for things.

## 2022-10-07 NOTE — Telephone Encounter (Signed)
Routing to Dr. Gonzalez as an FYI 

## 2022-10-08 ENCOUNTER — Encounter: Payer: Self-pay | Admitting: Nurse Practitioner

## 2022-10-09 ENCOUNTER — Ambulatory Visit: Payer: Commercial Managed Care - HMO | Admitting: Family Medicine

## 2022-10-09 ENCOUNTER — Encounter: Payer: Self-pay | Admitting: Family Medicine

## 2022-10-09 VITALS — BP 126/68 | HR 86 | Temp 97.9°F | Ht 63.5 in | Wt 196.2 lb

## 2022-10-09 DIAGNOSIS — J4541 Moderate persistent asthma with (acute) exacerbation: Secondary | ICD-10-CM | POA: Diagnosis not present

## 2022-10-09 DIAGNOSIS — J4540 Moderate persistent asthma, uncomplicated: Secondary | ICD-10-CM | POA: Insufficient documentation

## 2022-10-09 DIAGNOSIS — J22 Unspecified acute lower respiratory infection: Secondary | ICD-10-CM | POA: Diagnosis not present

## 2022-10-09 MED ORDER — PREDNISONE 20 MG PO TABS: ORAL_TABLET | ORAL | 0 refills | Status: DC

## 2022-10-09 MED ORDER — AZITHROMYCIN 250 MG PO TABS: ORAL_TABLET | ORAL | 0 refills | Status: DC

## 2022-10-09 MED ORDER — PROMETHAZINE-DM 6.25-15 MG/5ML PO SYRP: 2.5000 mL | ORAL_SOLUTION | Freq: Three times a day (TID) | ORAL | 0 refills | Status: AC | PRN

## 2022-10-09 NOTE — Progress Notes (Signed)
Patient ID: Danielle Strickland, female    DOB: 09-Jun-1966, 56 y.o.   MRN: [id number]  This visit was conducted in person.  BP 126/68   Pulse 86   Temp 97.9 F (36.6 C) (Temporal)   Ht 5' 3.5" (1.613 m)   Wt 196 lb 3.2 oz (89 kg)   SpO2 96%   BMI 34.21 kg/m    CC: cough Subjective:   HPI: Danielle Strickland is a 56 y.o. female presenting on 10/09/2022 for Cough (C/o cough- worse in AM and at bedtime, facial pain, ear pain, chest pain- feels heavy and teeth pain.   Sxs started about 1.5 wks ago. Tried Zyrtec, Tylenol and benzonatate- not helpful. States in previous times promethazine-codeine has helped. )   10d h/o cough, HA, facial pain "burning", chest heaviness deep in chest, this morning cough productive of green mucous. Cough/wheezing worse in am and pm. Chest > head congestion. Tmax 99. Tooth pressure, ST, PNDrainage.   No fevers/chills, ear pain, nausea, diarrhea, abd pain.   Has not checked for COVID.   She's regularly using her CPAP for OSA - notes worsening sinus symptoms.   Daughter was recently sick as well 3 wks ago.  Sister currently with PNA.  Multiple family members with pneumonia, also sickness at work.   H/o mod persistent asthma and OSA followed by Dr Patsey Berthold pulmonology on albuterol PRN, arnuity ellipta (fluticasone) 161mg/act 1 puff daily, allegra.   Advair, symbicort, breo - poor response - caused HA and tremors.   She's been taking tessalon perls with some improvement.      Relevant past medical, surgical, family and social history reviewed and updated as indicated. Interim medical history since our last visit reviewed. Allergies and medications reviewed and updated. Outpatient Medications Prior to Visit  Medication Sig Dispense Refill   acyclovir (ZOVIRAX) 200 MG capsule TAKE 2 CAPSULES BY MOUTH 2 TO 3 TIMES DAILY FOR 5 DAYS AS NEEDED 60 capsule 0   albuterol (VENTOLIN HFA) 108 (90 Base) MCG/ACT inhaler Inhale 2 puffs into the lungs every 4 (four)  hours as needed for wheezing or shortness of breath. 18 g 1   ARNUITY ELLIPTA 100 MCG/ACT AEPB Take 1 puff by mouth daily.     benzonatate (TESSALON) 100 MG capsule Take 1 capsule (100 mg total) by mouth 2 (two) times daily for 10 days. 20 capsule 0   citalopram (CELEXA) 10 MG tablet Take 1.5 tablets (15 mg total) by mouth daily. 135 tablet 3   desonide (DESOWEN) 0.05 % ointment As needed     fexofenadine (ALLEGRA) 180 MG tablet Take 180 mg by mouth daily. Over the counter.     meloxicam (MOBIC) 15 MG tablet Take 1 tablet by mouth once daily 90 tablet 1   mupirocin ointment (BACTROBAN) 2 % Apply 1 Application topically 2 (two) times daily. 22 g 0   pantoprazole (PROTONIX) 40 MG tablet Take 1 tablet (40 mg total) by mouth daily. 30 tablet 11   promethazine (PHENERGAN) 25 MG tablet Take 1q8h prn N&V with Migraines 30 tablet 0   simvastatin (ZOCOR) 20 MG tablet Take 1 tablet (20 mg total) by mouth at bedtime. 90 tablet 3   SUMAtriptan (IMITREX) 50 MG tablet Take 1 tablet at onset of migraine, may repeat in 2 hours if needed. No more than 2 doses in 24 hours 10 tablet 2   triamcinolone (KENALOG) 0.1 % paste Use as directed 1 application. in the mouth or throat 2 (two) times  daily. 5 g 2   cephALEXin (KEFLEX) 500 MG capsule Take 1 capsule (500 mg total) by mouth 4 (four) times daily. (Patient not taking: Reported on 10/01/2022) 28 capsule 0   fluticasone-salmeterol (WIXELA INHUB) 250-50 MCG/ACT AEPB Inhale 1 puff into the lungs in the morning and at bedtime. (Patient not taking: Reported on 10/01/2022) 60 each 2   hydrOXYzine (ATARAX) 10 MG tablet Take 1 tablet (10 mg total) by mouth 2 (two) times daily as needed. 30 tablet 0   meclizine (ANTIVERT) 12.5 MG tablet Take 1 tablet (12.5 mg total) by mouth daily as needed for dizziness. (Patient not taking: Reported on 10/01/2022) 30 tablet 0   No facility-administered medications prior to visit.     Per HPI unless specifically indicated in ROS section  below Review of Systems  Objective:  BP 126/68   Pulse 86   Temp 97.9 F (36.6 C) (Temporal)   Ht 5' 3.5" (1.613 m)   Wt 196 lb 3.2 oz (89 kg)   SpO2 96%   BMI 34.21 kg/m   Wt Readings from Last 3 Encounters:  10/09/22 196 lb 3.2 oz (89 kg)  10/01/22 195 lb 6.4 oz (88.6 kg)  07/16/22 192 lb 6.4 oz (87.3 kg)      Physical Exam Vitals and nursing note reviewed.  Constitutional:      Appearance: Normal appearance. She is not ill-appearing.  HENT:     Head: Normocephalic and atraumatic.     Right Ear: Tympanic membrane, ear canal and external ear normal. There is no impacted cerumen.     Left Ear: Tympanic membrane, ear canal and external ear normal. There is no impacted cerumen.     Nose: Mucosal edema and congestion present. No rhinorrhea.     Right Turbinates: Not enlarged, swollen or pale.     Left Turbinates: Not enlarged, swollen or pale.     Right Sinus: Frontal sinus tenderness present. No maxillary sinus tenderness.     Left Sinus: Maxillary sinus tenderness and frontal sinus tenderness present.     Mouth/Throat:     Mouth: Mucous membranes are moist.     Pharynx: Oropharynx is clear. No oropharyngeal exudate or posterior oropharyngeal erythema.  Eyes:     Extraocular Movements: Extraocular movements intact.     Conjunctiva/sclera: Conjunctivae normal.     Pupils: Pupils are equal, round, and reactive to light.  Cardiovascular:     Rate and Rhythm: Normal rate and regular rhythm.     Pulses: Normal pulses.     Heart sounds: Normal heart sounds. No murmur heard. Pulmonary:     Effort: Pulmonary effort is normal. No respiratory distress.     Breath sounds: Rhonchi (scattered faint) present. No wheezing or rales.     Comments: Bibasilar crackles R>L, some clearing with deep cough Chest:     Chest wall: Tenderness present.       Comments: Reproducible tenderness to palpation of L 2nd costochondral junction.  Lymphadenopathy:     Head:     Right side of head: No  submental, submandibular, tonsillar, preauricular or posterior auricular adenopathy.     Left side of head: No submental, submandibular, tonsillar, preauricular or posterior auricular adenopathy.     Cervical: No cervical adenopathy.     Right cervical: No superficial cervical adenopathy.    Left cervical: No superficial cervical adenopathy.     Upper Body:     Right upper body: No supraclavicular adenopathy.     Left upper body: No  supraclavicular adenopathy.  Skin:    Findings: No rash.  Neurological:     Mental Status: She is alert.  Psychiatric:        Mood and Affect: Mood normal.        Behavior: Behavior normal.         Assessment & Plan:   Problem List Items Addressed This Visit     Acute respiratory infection - Primary    Anticipate bronchitis with sinusitis that is flaring asthma. Rx prednisone taper, zpack antibiotic, phenergan cough syrup.  Update if not improving with treatment. Red flags to seek further care reviewed including hypoxia, fever >101, worsening cough. Would consider CXR if not improving as expected.  Anticipate she also has component of costochondritis from ongoing cough. Rec heating pad, topical voltaren NSAID gel PRN.       Relevant Medications   azithromycin (ZITHROMAX) 250 MG tablet   Asthma, moderate persistent    See above. Rx prednisone taper, discussed regular albuterol use for next several days (2 puffs Q6 hours PRN). She continues arnuity ellipta.       Relevant Medications   predniSONE (DELTASONE) 20 MG tablet     Meds ordered this encounter  Medications   promethazine-dextromethorphan (PROMETHAZINE-DM) 6.25-15 MG/5ML syrup    Sig: Take 2.5 mLs by mouth 3 (three) times daily as needed for cough (sedation precautions).    Dispense:  118 mL    Refill:  0   azithromycin (ZITHROMAX) 250 MG tablet    Sig: Take two tablets on day one followed by one tablet on days 2-5    Dispense:  6 each    Refill:  0   predniSONE (DELTASONE) 20 MG  tablet    Sig: Take two tablets daily for 3 days followed by one tablet daily for 3 days    Dispense:  9 tablet    Refill:  0   No orders of the defined types were placed in this encounter.    Patient Instructions  I think you have bronchitis flaring asthma.  Treat with zpack antibiotic as well as prednisone taper  Phenergan cough syrup refilled to use as needed for cough.  Push fluids and rest.  Let us know if not improving with treatment, or if worsening cough, new fever >101.   Follow up plan: No follow-ups on file.  Ria Bush, MD

## 2022-10-09 NOTE — Assessment & Plan Note (Addendum)
Anticipate bronchitis with sinusitis that is flaring asthma. Rx prednisone taper, zpack antibiotic, phenergan cough syrup.  Update if not improving with treatment. Red flags to seek further care reviewed including hypoxia, fever >101, worsening cough. Would consider CXR if not improving as expected.  Anticipate she also has component of costochondritis from ongoing cough. Rec heating pad, topical voltaren NSAID gel PRN.

## 2022-10-09 NOTE — Patient Instructions (Addendum)
I think you have bronchitis flaring asthma.  Treat with zpack antibiotic as well as prednisone taper  Phenergan cough syrup refilled to use as needed for cough.  Push fluids and rest.  Let us know if not improving with treatment, or if worsening cough, new fever >101.

## 2022-10-09 NOTE — Assessment & Plan Note (Signed)
See above. Rx prednisone taper, discussed regular albuterol use for next several days (2 puffs Q6 hours PRN). She continues arnuity ellipta.

## 2022-10-16 ENCOUNTER — Telehealth: Payer: Self-pay | Admitting: Nurse Practitioner

## 2022-10-16 ENCOUNTER — Encounter: Payer: Self-pay | Admitting: Family Medicine

## 2022-10-16 NOTE — Telephone Encounter (Signed)
Noted. Agree with evaluation.  

## 2022-10-16 NOTE — Telephone Encounter (Signed)
Pt was given a Z-Pac 10-09-22 by Dr Danise Mina. Will forward to pt's PCP as he is in the office today.

## 2022-10-16 NOTE — Telephone Encounter (Signed)
Pt called in stated she's still not feeling better has a headache , nausea and chest hurt from coughing . No available appointments pt was transfer to Triage nurse

## 2022-10-16 NOTE — Telephone Encounter (Signed)
[facility] RECORD [facility] Patient Name: Danielle Strickland Gender: Female DOB: 1966/09/01 Age: 56 Y 10 M 29 D Return Phone Number: [phone] (Primary) Address: City/ State/ Zip: [place] [address] Client [facility] - Client Client Site [facility] - Day Provider Romilda Garret- NP Contact Type Call Who Is Calling Patient / Member / Family / Caregiver Call Type Triage / Clinical Relationship To Patient Self Return Phone Number [phone] (Primary) Chief Complaint CHEST PAIN - pain, pressure, heaviness or tightness Reason for Call Symptomatic / Request for [place] states that she has a cough, chest pain, nausea, and a headache. Translation No Nurse Assessment Nurse: Hassell Done, RN, Joelene Millin Date/Time Eilene Ghazi Time): 10/16/2022 8:38:49 AM Confirm and document reason for call. If symptomatic, describe symptoms. ---caller states she has been dx with bronchitis, sinus and bronchial flare up. she states she has chest pain. no fever. Does the patient have any new or worsening symptoms? ---Yes Will a triage be completed? ---Yes Related visit to physician within the last 2 weeks? ---No Does the PT have any chronic conditions? (i.e. diabetes, asthma, this includes High risk factors for pregnancy, etc.) ---Yes List chronic conditions. ---asthma, COPD Is this a behavioral health or substance abuse call? ---No Guidelines Guideline Title Affirmed Question Affirmed Notes Nurse Date/Time Eilene Ghazi Time) Chest Pain [1] Chest pain lasts > 5 minutes AND [2] age > 32 [name] 10/16/2022 8:40:30 AM Disp. Time Eilene Ghazi Time) Disposition Final User 10/16/2022 8:37:49 AM Send to Urgent Queue Windy Canny 10/16/2022 8:41:35 AM Call EMS 911 Now Yes Hassell Done, RNJoelene Millin 10/16/2022 8:42:15 AM 911 Outcome Documentation Hassell Done, RN,  Joelene Millin PLEASE NOTE: All timestamps contained within this report are represented as [place] Standard Time. CONFIDENTIALTY NOTICE: This fax transmission is intended only for the addressee. It contains information that is legally privileged, confidential or otherwise protected from use or disclosure. If you are not the intended recipient, you are strictly prohibited from reviewing, disclosing, copying using or disseminating any of this information or taking any action in reliance on or regarding this information. If you have received this fax in error, please notify us immediately by telephone so that we can arrange for its return to [place]. Phone: [phone], Toll-Free: [phone], Fax: [phone] Page: 2 of 2 Call Id: [id number] [place]. Time Eilene Ghazi Time) Disposition Final User Reason: refused 911 Final Disposition 10/16/2022 8:41:35 AM Call EMS 911 Now Yes Hassell Done, RN, Renea Ee Disagree/Comply Comply Caller Understands Yes PreDisposition Call Doctor Care Advice Given Per Guideline CALL EMS 911 NOW: * Immediate medical attention is needed. You need to hang up and call 911 (or an ambulance). * Triager Discretion: I'll call you back in a few minutes to be sure you were able to reach them. CARE ADVICE given per Chest Pain (Adult) guideline

## 2022-10-16 NOTE — Telephone Encounter (Signed)
Per appt notes pt made appt at [facility] by [facility] on 10/17/22. I spoke with pt. Pt said that she is having the same mid dull CP that she had when seen by Dr Darnell Level on 10/09/22. Now pain level is 4. Pt said the pain is constant and the CP is worse when pt coughs. (Has dry cough. No fever, SOB or wheezing. )pt has spoken with access nurse and was advised to go to ED. Pt said she made appt at [facility] thru [place] for 12/23.23. pt is at work and will decide what to do later. UC & ED precautions given and pt voiced understanding. Sending note to Romilda Garret NP and [place] pool.

## 2022-10-16 NOTE — Telephone Encounter (Signed)
Patient is scheduled for UC per another note. They can evaluate there

## 2022-10-17 ENCOUNTER — Ambulatory Visit: Payer: Self-pay

## 2022-10-28 ENCOUNTER — Ambulatory Visit
Admission: RE | Admit: 2022-10-28 | Discharge: 2022-10-28 | Disposition: A | Discharge status: Home / Self Care | Payer: Commercial Managed Care - HMO | Source: Ambulatory Visit | Attending: Obstetrics and Gynecology | Admitting: Obstetrics and Gynecology

## 2022-10-28 DIAGNOSIS — Z1231 Encounter for screening mammogram for malignant neoplasm of breast: Secondary | ICD-10-CM

## 2022-12-03 ENCOUNTER — Telehealth: Payer: Self-pay | Admitting: Gastroenterology

## 2022-12-03 NOTE — Telephone Encounter (Signed)
Pt called in ref to her yearly appointment that is scheduled on 12-08-2022. She stated that she cannot afford the co-pay so could she pass the years appointment and still have her protonix called in. [place]

## 2022-12-07 ENCOUNTER — Encounter: Payer: Self-pay | Admitting: Gastroenterology

## 2022-12-07 MED ORDER — PANTOPRAZOLE SODIUM 40 MG PO TBEC: 40.0000 mg | DELAYED_RELEASE_TABLET | Freq: Every day | ORAL | 2 refills | Status: DC

## 2022-12-08 ENCOUNTER — Encounter: Payer: Self-pay | Admitting: Gastroenterology

## 2022-12-08 ENCOUNTER — Ambulatory Visit (INDEPENDENT_AMBULATORY_CARE_PROVIDER_SITE_OTHER): Payer: Commercial Managed Care - HMO | Admitting: Gastroenterology

## 2022-12-08 VITALS — BP 113/71 | HR 82 | Temp 98.6°F | Wt 197.0 lb

## 2022-12-08 DIAGNOSIS — K219 Gastro-esophageal reflux disease without esophagitis: Secondary | ICD-10-CM | POA: Diagnosis not present

## 2022-12-08 MED ORDER — PANTOPRAZOLE SODIUM 40 MG PO TBEC: 40.0000 mg | DELAYED_RELEASE_TABLET | Freq: Every day | ORAL | 11 refills | Status: DC
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
## 2023-01-04 ENCOUNTER — Encounter: Payer: Self-pay | Admitting: Pulmonary Disease

## 2023-01-09 ENCOUNTER — Encounter: Payer: Self-pay | Admitting: Pulmonary Disease

## 2023-01-21 ENCOUNTER — Ambulatory Visit: Payer: Commercial Managed Care - HMO | Admitting: Pulmonary Disease

## 2023-02-02 ENCOUNTER — Ambulatory Visit: Payer: Commercial Managed Care - HMO | Admitting: Pulmonary Disease

## 2023-02-02 ENCOUNTER — Other Ambulatory Visit: Payer: Self-pay

## 2023-02-02 ENCOUNTER — Encounter: Payer: Self-pay | Admitting: Pulmonary Disease

## 2023-02-02 VITALS — BP 132/78 | HR 89 | Temp 98.1°F | Ht 63.5 in | Wt 199.6 lb

## 2023-02-02 DIAGNOSIS — G4733 Obstructive sleep apnea (adult) (pediatric): Secondary | ICD-10-CM | POA: Diagnosis not present

## 2023-02-02 DIAGNOSIS — J4540 Moderate persistent asthma, uncomplicated: Secondary | ICD-10-CM

## 2023-02-02 DIAGNOSIS — R0602 Shortness of breath: Secondary | ICD-10-CM | POA: Diagnosis not present

## 2023-02-02 LAB — NITRIC OXIDE: Nitric Oxide: 42

## 2023-02-02 MED ORDER — ALVESCO 80 MCG/ACT IN AERS: 2.0000 | INHALATION_SPRAY | Freq: Two times a day (BID) | RESPIRATORY_TRACT | 11 refills | Status: DC

## 2023-02-02 NOTE — Patient Instructions (Signed)
I have printed the prescription for Alvesco so that you can take it to the cheapest place you can find through GoodRx.  It appears that the [facility] in [place] has it for the least amount of money.  I would call them first before making the trip.  The inhaler should be 2 puffs twice a day, make sure you rinse your mouth well after you use it.  Continue using your CPAP, your compliance is good.  Will see you in follow-up in 3 months time call sooner should any new problems arise.

## 2023-02-02 NOTE — Progress Notes (Signed)
Subjective:    Patient ID: Danielle Strickland, female    DOB: 06/16/1966, 57 y.o.   MRN: [id number] Patient Care Team: Eden Emmsable, James M, NP as PCP - General (Pain Medicine) Salena SanerGonzalez, Arti Trang L, MD as Consulting Physician (Pulmonary Disease)  Chief Complaint  Patient presents with   Follow-up    SOB and wheezing with exertion. No cough.    HPI Patient is a 57 year old former smoker (32 pack years) who follows up for the issue of dyspnea present for over a years duration.  The last seen here on 01 October 2022.  Dyspnea mostly on overexertion and carrying heavy things.  On lung cancer screening she was noted to have significant centrilobular and paraseptal emphysema.  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  She has a very strong asthmatic component.  Prior to this visit she had multiple inhalers tried but she had difficulties due to tachypalpitations, tremor etc. with LAMA/ICS/LABA inhalers.  She is however tolerant of Arnuity Ellipta and this does well for her.  She uses albuterol only as needed due to the untoward side effect of tremor.  She was also diagnosed with obstructive sleep apnea.  She is currently on CPAP (auto CPAP 5 to 15 cm H2O) the patient shows excellent compliance of 100% over 30 days with AHI controlled at 1.2.  Benefit from the therapy.  She has difficulties obtaining her Arnuity now because her insurance will no longer cover for it.  She is currently not on any asthma controller medication due to the cost.   She does not endorse any cough, sputum production or hemoptysis.  She has shortness of breath with exertion as prior.  No chest pain.  No lower extremity edema no calf tenderness.  Does not endorse any other symptomatology.  DATA  05/01/2022 PFTs: FEV1 was 1.39 L or 54% predicted FVC 2.82 L or 85% predicted, FEV1/FVC 49%.  Postbronchodilator patient had dramatic response with FEV1 normalizing to 2.33 L or 91% predicted for a net change of 68%.  Diffusion capacity normal.   More consistent with asthma than COPD. 05/16/2022 Home sleep study: Mild obstructive sleep apnea AHI 8.9/h, significant O2 desaturations to 83% 05/28/2022 echocardiogram: LVEF 60 to 65%.  Grade 1 DD.  Review of Systems A 10 point review of systems was performed and it is as noted above otherwise negative.  Patient Active Problem List   Diagnosis Date Noted   Acute respiratory infection 10/09/2022   Asthma, moderate persistent 10/09/2022   Canker sore 02/23/2022   Bilateral lower extremity edema 02/23/2022   Centrilobular emphysema 02/11/2022   Other fatigue 12/31/2021   Genital herpes simplex 12/31/2021   Morbid obesity 12/31/2021   Screening for lung cancer 12/31/2021   Polyp of sigmoid colon    Elevated liver enzymes 06/18/2021   Vitamin D deficiency 03/28/2021   Aortic atherosclerosis 01/25/2021   Degenerative disc disease, cervical 01/25/2021   IFG (impaired fasting glucose) 04/04/2020   Preventative health care 11/20/2019   COVID-19 virus infection 10/30/2019   SOB (shortness of breath) 08/09/2018   Palpitation 07/22/2018   Hypercholesterolemia 05/10/2018   Elevated TSH 05/10/2018   Restless legs syndrome (RLS) 04/06/2018   Anxiety 02/01/2018   Gastroesophageal reflux disease 02/01/2018   Inflammatory disease of liver 02/01/2018   Menopausal syndrome 02/01/2018   Migraine 02/01/2018   Recurrent hematuria 02/01/2018   Complex regional pain syndrome type 1 affecting right lower leg 09/03/2016   PTSD (post-traumatic stress disorder) 03/02/2016   Malleolar fracture 12/10/2015  Hx MRSA infection 06/18/2015   Abnormal chest x-ray 05/29/2015   Light headedness 08/20/2014   Family history of colon cancer 02/21/2013   TMJ (dislocation of temporomandibular joint) 02/21/2013   Allergy    Skin cancer    Peptic ulcer disease    Generalized anxiety disorder    Herpes simplex without mention of complication    IBS (irritable bowel syndrome)    History of migraine     Social History   Tobacco Use   Smoking status: Former    Packs/day: 1.00    Years: 32.00    Additional pack years: 0.00    Total pack years: 32.00    Types: Cigarettes    Quit date: 03/03/2015    Years since quitting: 7.9   Smokeless tobacco: Never  Substance Use Topics   Alcohol use: No    Alcohol/week: 0.0 standard drinks of alcohol   Allergies  Allergen Reactions   Amoxicillin-Pot Clavulanate Nausea And Vomiting and Other (See Comments)    Has patient had a PCN reaction causing immediate rash, facial/tongue/throat swelling, SOB or lightheadedness with hypotension: No Has patient had a PCN reaction causing severe rash involving mucus membranes or skin necrosis: No Has patient had a PCN reaction that required hospitalization No Has patient had a PCN reaction occurring within the last 10 years: No If all of the above answers are "NO", then may proceed with Cephalosporin use. Other reaction(s): Vomiting   Latex Rash   Aspirin Nausea And Vomiting   Neosporin [Neomycin-Bacitracin Zn-Polymyx]    Current Meds  Medication Sig   acyclovir (ZOVIRAX) 200 MG capsule TAKE 2 CAPSULES BY MOUTH 2 TO 3 TIMES DAILY FOR 5 DAYS AS NEEDED   albuterol (VENTOLIN HFA) 108 (90 Base) MCG/ACT inhaler Inhale 2 puffs into the lungs every 4 (four) hours as needed for wheezing or shortness of breath.   citalopram (CELEXA) 10 MG tablet Take 1.5 tablets (15 mg total) by mouth daily.   desonide (DESOWEN) 0.05 % ointment As needed   fexofenadine (ALLEGRA) 180 MG tablet Take 180 mg by mouth daily. Over the counter.   meloxicam (MOBIC) 15 MG tablet Take 1 tablet by mouth once daily   mupirocin ointment (BACTROBAN) 2 % Apply 1 Application topically 2 (two) times daily.   pantoprazole (PROTONIX) 40 MG tablet Take 1 tablet (40 mg total) by mouth daily.   promethazine (PHENERGAN) 25 MG tablet Take 1q8h prn N&V with Migraines   simvastatin (ZOCOR) 20 MG tablet Take 1 tablet (20 mg total) by mouth at bedtime.    SUMAtriptan (IMITREX) 50 MG tablet Take 1 tablet at onset of migraine, may repeat in 2 hours if needed. No more than 2 doses in 24 hours   triamcinolone (KENALOG) 0.1 % paste Use as directed 1 application. in the mouth or throat 2 (two) times daily.   Immunization History  Administered Date(s) Administered   PFIZER Comirnaty(Gray Top)Covid-19 Tri-Sucrose Vaccine 06/06/2020, 06/28/2020   Tdap 05/23/2016, 07/09/2022       Objective:   Physical Exam BP 132/78 (BP Location: Left Arm, Cuff Size: Large)   Pulse 89   Temp 98.1 F (36.7 C)   Ht 5' 3.5" (1.613 m)   Wt 199 lb 9.6 oz (90.5 kg)   SpO2 95%   BMI 34.80 kg/m   SpO2: 95 % O2 Device: None (Room air)  GENERAL: Obese woman, no acute distress, fully ambulatory, no conversational dyspnea. HEAD: Normocephalic, atraumatic.  EYES: Pupils equal, round, reactive to light.  No scleral  icterus.  MOUTH: Oral mucosa moist.  Dentition intact. NECK: Supple. No thyromegaly. Trachea midline. No JVD.  No adenopathy. PULMONARY: Good air entry bilaterally.  No adventitious sounds. CARDIOVASCULAR: S1 and S2. Regular rate and rhythm.  No rubs, murmurs or gallops heard. ABDOMEN: Obese, otherwise benign. MUSCULOSKELETAL: No joint deformity, no clubbing, no edema.  NEUROLOGIC: No overt focal deficit, no gait disturbance, speech is fluent. SKIN: Intact,warm,dry. PSYCH: Mood and behavior normal.    Lab Results  Component Value Date   NITRICOXIDE 42 02/02/2023         Assessment & Plan:     ICD-10-CM   1. Moderate persistent asthma without complication  J45.40    She has a very prominent asthmatic component Needs to be on asthma controller medication Nitric oxide 42 ppb today Trial of Alvesco ($38 with good Rx coupon)    2. Shortness of breath  R06.02 Nitric oxide   Likely due to poorly compensated asthma    3. OSA (obstructive sleep apnea)  G47.33    Good compliance with CPAP Continue same     Meds ordered this encounter   Medications   ciclesonide (ALVESCO) 80 MCG/ACT inhaler    Sig: Inhale 2 puffs into the lungs 2 (two) times daily.    Dispense:  1 each    Refill:  11   The patient was provided a printed prescription of Alvesco so that she can use her GoodRx coupon.  This will be Alvesco 80 mcg 2 puffs twice a day.  Patient is to continue using her CPAP.  We will see her in follow-up in 3 months time she is to contact us prior to that time should any new difficulties arise.  Gailen Shelter, MD Advanced Bronchoscopy PCC[facility]    *This note was dictated using voice recognition software/Dragon.  Despite best efforts to proofread, errors can occur which can change the meaning. Any transcriptional errors that result from this process are unintentional and may not be fully corrected at the time of dictation.

## 2023-02-03 ENCOUNTER — Encounter: Payer: Self-pay | Admitting: Pulmonary Disease

## 2023-02-03 MED ORDER — ALVESCO 80 MCG/ACT IN AERS: 2.0000 | INHALATION_SPRAY | Freq: Two times a day (BID) | RESPIRATORY_TRACT | 11 refills | Status: DC

## 2023-02-05 ENCOUNTER — Ambulatory Visit
Admission: RE | Admit: 2023-02-05 | Discharge: 2023-02-05 | Disposition: A | Discharge status: Home / Self Care | Payer: Commercial Managed Care - HMO | Source: Ambulatory Visit | Attending: Acute Care | Admitting: Acute Care

## 2023-02-05 ENCOUNTER — Ambulatory Visit: Payer: Commercial Managed Care - HMO

## 2023-02-05 DIAGNOSIS — Z87891 Personal history of nicotine dependence: Secondary | ICD-10-CM

## 2023-02-05 DIAGNOSIS — Z122 Encounter for screening for malignant neoplasm of respiratory organs: Secondary | ICD-10-CM

## 2023-02-08 ENCOUNTER — Other Ambulatory Visit: Payer: Self-pay | Admitting: Acute Care

## 2023-02-08 DIAGNOSIS — Z87891 Personal history of nicotine dependence: Secondary | ICD-10-CM

## 2023-02-08 DIAGNOSIS — Z122 Encounter for screening for malignant neoplasm of respiratory organs: Secondary | ICD-10-CM

## 2023-02-11 ENCOUNTER — Ambulatory Visit: Payer: Commercial Managed Care - HMO | Admitting: Nurse Practitioner

## 2023-02-15 ENCOUNTER — Ambulatory Visit: Payer: Commercial Managed Care - HMO | Admitting: Nurse Practitioner

## 2023-02-17 ENCOUNTER — Ambulatory Visit: Payer: Commercial Managed Care - HMO | Admitting: Nurse Practitioner

## 2023-02-17 VITALS — BP 126/64 | HR 95 | Temp 98.2°F | Resp 16 | Ht 63.5 in | Wt 199.5 lb

## 2023-02-17 DIAGNOSIS — R7989 Other specified abnormal findings of blood chemistry: Secondary | ICD-10-CM | POA: Diagnosis not present

## 2023-02-17 DIAGNOSIS — Z87891 Personal history of nicotine dependence: Secondary | ICD-10-CM

## 2023-02-17 DIAGNOSIS — F411 Generalized anxiety disorder: Secondary | ICD-10-CM

## 2023-02-17 DIAGNOSIS — J432 Centrilobular emphysema: Secondary | ICD-10-CM | POA: Diagnosis not present

## 2023-02-17 DIAGNOSIS — K279 Peptic ulcer, site unspecified, unspecified as acute or chronic, without hemorrhage or perforation: Secondary | ICD-10-CM

## 2023-02-17 DIAGNOSIS — G2581 Restless legs syndrome: Secondary | ICD-10-CM | POA: Diagnosis not present

## 2023-02-17 DIAGNOSIS — I700 Atherosclerosis of aorta: Secondary | ICD-10-CM | POA: Diagnosis not present

## 2023-02-17 DIAGNOSIS — E669 Obesity, unspecified: Secondary | ICD-10-CM | POA: Diagnosis not present

## 2023-02-17 DIAGNOSIS — E559 Vitamin D deficiency, unspecified: Secondary | ICD-10-CM | POA: Diagnosis not present

## 2023-02-17 DIAGNOSIS — Z0000 Encounter for general adult medical examination without abnormal findings: Secondary | ICD-10-CM | POA: Diagnosis not present

## 2023-02-17 DIAGNOSIS — K219 Gastro-esophageal reflux disease without esophagitis: Secondary | ICD-10-CM

## 2023-02-17 DIAGNOSIS — G43009 Migraine without aura, not intractable, without status migrainosus: Secondary | ICD-10-CM | POA: Diagnosis not present

## 2023-02-17 DIAGNOSIS — F4310 Post-traumatic stress disorder, unspecified: Secondary | ICD-10-CM

## 2023-02-17 LAB — COMPREHENSIVE METABOLIC PANEL
ALT: 18 U/L (ref 0–35)
AST: 20 U/L (ref 0–37)
Albumin: 4.4 g/dL (ref 3.5–5.2)
Alkaline Phosphatase: 82 U/L (ref 39–117)
BUN: 15 mg/dL (ref 6–23)
CO2: 28 mEq/L (ref 19–32)
Calcium: 9.3 mg/dL (ref 8.4–10.5)
Chloride: 103 mEq/L (ref 96–112)
Creatinine, Ser: 0.69 mg/dL (ref 0.40–1.20)
GFR: 95.11 mL/min (ref >60.00)
Glucose, Bld: 95 mg/dL (ref 70–99)
Potassium: 4.2 mEq/L (ref 3.5–5.1)
Sodium: 140 mEq/L (ref 135–145)
Total Bilirubin: 0.6 mg/dL (ref 0.2–1.2)
Total Protein: 7.3 g/dL (ref 6.0–8.3)

## 2023-02-17 LAB — CBC
HCT: 37.7 % (ref 36.0–46.0)
Hemoglobin: 13.1 g/dL (ref 12.0–15.0)
MCHC: 34.7 g/dL (ref 30.0–36.0)
MCV: 95.2 fl (ref 78.0–100.0)
Platelets: 291 10*3/uL (ref 150.0–400.0)
RBC: 3.96 Mil/uL (ref 3.87–5.11)
RDW: 12.9 % (ref 11.5–15.5)
WBC: 5.5 10*3/uL (ref 4.0–10.5)

## 2023-02-17 LAB — VITAMIN D 25 HYDROXY (VIT D DEFICIENCY, FRACTURES): VITD: 15.38 ng/mL — ABNORMAL LOW (ref 30.00–100.00)

## 2023-02-17 LAB — LIPID PANEL
Cholesterol: 191 mg/dL (ref 0–200)
HDL: 49.3 mg/dL (ref >39.00)
LDL Cholesterol: 109 mg/dL — ABNORMAL HIGH (ref 0–99)
NonHDL: 141.35
Total CHOL/HDL Ratio: 4
Triglycerides: 164 mg/dL — ABNORMAL HIGH (ref 0.0–149.0)
VLDL: 32.8 mg/dL (ref 0.0–40.0)

## 2023-02-17 LAB — URINALYSIS, MICROSCOPIC ONLY: RBC / HPF: NONE SEEN (ref 0–?)

## 2023-02-17 LAB — TSH: TSH: 5.3 u[IU]/mL (ref 0.35–5.50)

## 2023-02-17 LAB — HEMOGLOBIN A1C: Hgb A1c MFr Bld: 5.7 % (ref 4.6–6.5)

## 2023-02-17 MED ORDER — CITALOPRAM HYDROBROMIDE 10 MG PO TABS: 20.0000 mg | ORAL_TABLET | Freq: Every day | ORAL | 3 refills | Status: DC

## 2023-02-17 NOTE — Assessment & Plan Note (Signed)
Doing decently well.  Will increase citalopram from 15 mg to 20 mg.

## 2023-02-17 NOTE — Assessment & Plan Note (Signed)
Has been noted on low-dose CT scan.  Patient is on simvastatin 20 mg daily.  Continue to try to work on lifestyle modifications

## 2023-02-17 NOTE — Assessment & Plan Note (Signed)
Less frequent currently. Once a month.  She will use sumatriptan as needed and will abort the headache.  Continue medication as prescribed

## 2023-02-17 NOTE — Assessment & Plan Note (Signed)
History of the same pending lab 

## 2023-02-17 NOTE — Patient Instructions (Signed)
Nice to see you today I will be in touch with the labs once I have reviewed them We will increase the citalopram to  total. You can tak 2 tablets of the   Follow up in 1 year for your next physical and labs   Consider getting the pneumonia vaccine

## 2023-02-17 NOTE — Assessment & Plan Note (Signed)
Pending urine microscopy for microscopic hematuria.

## 2023-02-17 NOTE — Assessment & Plan Note (Signed)
States she is going weight rather quickly as of late.  Pending labs include TSH and A1c
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
## 2023-02-17 NOTE — Assessment & Plan Note (Signed)
History of the same.  Some epigastric tenderness on exam patient is currently on Protonix 40 mg daily

## 2023-02-17 NOTE — Assessment & Plan Note (Signed)
This was noted on low-dose CT scan.  Patient is followed by pulmonology and on Alvesco inhaler

## 2023-02-17 NOTE — Assessment & Plan Note (Signed)
Discussed age-appropriate immunizations and screening exams.  Patient is up-to-date on vaccinations that she wants excluding pneumonia vaccine.  Did review patient's family, social, personal, surgical history.  Patient is up-to-date on CRC screening and breast cancer screening.  Did give patient information at discharge about regular healthcare maintenance with anticipatory guidance.  Also gave her information about the Prevnar 20 vaccination
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
## 2023-02-17 NOTE — Assessment & Plan Note (Signed)
Last TSH was 6.40.  Repeat today pending lab results

## 2023-02-18 ENCOUNTER — Encounter: Payer: Self-pay | Admitting: Nurse Practitioner

## 2023-02-19 ENCOUNTER — Other Ambulatory Visit: Payer: Self-pay | Admitting: Nurse Practitioner

## 2023-02-19 DIAGNOSIS — E559 Vitamin D deficiency, unspecified: Secondary | ICD-10-CM

## 2023-02-19 MED ORDER — VITAMIN D (ERGOCALCIFEROL) 1.25 MG (50000 UNIT) PO CAPS
50000.0000 [IU] | ORAL_CAPSULE | ORAL | 0 refills | Status: DC
Start: 2023-02-19 — End: 2024-04-05

## 2023-02-19 NOTE — Telephone Encounter (Signed)
Called verified with patient she has seen results on my chart.  No further action needed at this time.

## 2023-02-23 ENCOUNTER — Encounter: Payer: Self-pay | Admitting: Nurse Practitioner

## 2023-02-23 ENCOUNTER — Encounter: Payer: Self-pay | Admitting: Pulmonary Disease

## 2023-02-23 MED ORDER — FLUTICASONE PROPIONATE HFA 110 MCG/ACT IN AERO: 2.0000 | INHALATION_SPRAY | Freq: Two times a day (BID) | RESPIRATORY_TRACT | 2 refills | Status: DC

## 2023-02-23 NOTE — Telephone Encounter (Signed)
The ingredients are not the same but they are similar.  My concern with the Flovent is that they are discontinuing it.  Apparently there is a generic in the works.  We can send in a prescription for fluticasone propionate HFA (Flovent HFA) 110 mcg 2 puffs twice a day, make sure rinse mouth well after use.

## 2023-02-25 ENCOUNTER — Ambulatory Visit: Payer: Commercial Managed Care - HMO | Admitting: Family Medicine

## 2023-02-25 ENCOUNTER — Encounter: Payer: Self-pay | Admitting: Family Medicine

## 2023-02-25 VITALS — BP 122/62 | HR 85 | Temp 97.8°F | Ht 63.5 in | Wt 200.0 lb

## 2023-02-25 DIAGNOSIS — M79642 Pain in left hand: Secondary | ICD-10-CM

## 2023-02-25 DIAGNOSIS — M65949 Unspecified synovitis and tenosynovitis, unspecified hand: Secondary | ICD-10-CM

## 2023-02-25 DIAGNOSIS — M659 Synovitis and tenosynovitis, unspecified: Secondary | ICD-10-CM | POA: Diagnosis not present

## 2023-02-25 MED ORDER — MELOXICAM 15 MG PO TABS: 15.0000 mg | ORAL_TABLET | Freq: Every day | ORAL | 1 refills | Status: DC

## 2023-02-25 MED ORDER — DICLOFENAC SODIUM 75 MG PO TBEC: 75.0000 mg | DELAYED_RELEASE_TABLET | Freq: Two times a day (BID) | ORAL | 1 refills | Status: DC

## 2023-02-25 NOTE — Progress Notes (Signed)
Danielle Maiello T. Azzam Mehra, MD, CAQ Sports Medicine [facility] at [facility] [address] [place], [address]  Phone: [phone]  FAX: [phone]  Danielle Strickland - 57 y.o. female  MRN [id number]  Date of Birth: 10-25-1966  Date: 02/25/2023  PCP: Danielle Emms, NP  Referral: Danielle Emms, NP  Chief Complaint  Patient presents with   Hand Pain    C/o L thumb pain. Started last night. Does not remember any injury but does states she fell 3x about 1.5 mos ago.   Subjective:   Danielle Strickland is a 57 y.o. very pleasant female patient with Body mass index is 34.87 kg/m. who presents with the following:  Patient presents with some ongoing left-sided thumb pain.  Cannot straighten her thumb and it aches really bad.  Larey Seat about a month and a half ago three different times.   No recall of hurting her left hand at that time.  Hurt all over, but not specific injury.   She has pain in the left first digit on the volar aspect.  She also has pain down in the thenar hyperthenar eminences, as well.  She has deep pain in the proximal thumb.  Right now, she has limited motion and has some swelling at the MCP and just proximal to this.  RHD -   Review of Systems is noted in the HPI, as appropriate  Objective:   BP 122/62   Pulse 85   Temp 97.8 F (36.6 C) (Temporal)   Ht 5' 3.5" (1.613 m)   Wt 200 lb (90.7 kg)   SpO2 95%   BMI 34.87 kg/m   GEN: No acute distress; alert,appropriate. PULM: Breathing comfortably in no respiratory distress PSYCH: Normally interactive.    L hand Ecchymosis or edema: neg ROM wrist/hand/digits: Restricted fist digit ROM Carpals, MCP's, digits:TTP at MCP of the 1st - TTP along the flexor tendon sheath Distal Ulna and Radius: NT Ecchymosis or edema: neg No instability Cysts/nodules: neg Digit triggering: neg Finkelstein's test: neg Snuffbox tenderness: neg Scaphoid tubercle: NT Resisted supination: NT Full composite  fist, no malrotation Grip, all digits: 5/5 str DIPJT: NT PIP JT: NT MCP JT: NT No tenosynovitis Axial load test: neg Atrophy: neg  Hand sensation: intact   Laboratory and Imaging Data:  Assessment and Plan:     ICD-10-CM   1. Flexor tenosynovitis of thumb  M65.9     2. Left hand pain  M79.642      Classic presentation with pain along the sheath and into the volar hand itself.   Place in a thumb spica splint with oral and topical NSAIDS.   She will f/u in 3 weeks.  Consider flexor tendon sheath injection if still symptomatic.   Medication Management during today's office visit: Meds ordered this encounter  Medications   DISCONTD: diclofenac (VOLTAREN) 75 MG EC tablet    Sig: Take 1 tablet (75 mg total) by mouth 2 (two) times daily.    Dispense:  60 tablet    Refill:  1   meloxicam (MOBIC) 15 MG tablet    Sig: Take 1 tablet (15 mg total) by mouth daily.    Dispense:  90 tablet    Refill:  1    Cancel prior Diclofenac prescription and fill Meloxicam instead   Medications Discontinued During This Encounter  Medication Reason   diclofenac (VOLTAREN) 75 MG EC tablet    meloxicam (MOBIC) 15 MG tablet Reorder    Orders  placed today for conditions managed today: No orders of the defined types were placed in this encounter.   Disposition: No follow-ups on file.  Dragon [employer] One speech-to-text software was used for transcription in this dictation.  Possible transcriptional errors can occur using Animal nutritionist.   Signed,  Elpidio Galea. Tiara Bartoli, MD   Outpatient Encounter Medications as of 02/25/2023  Medication Sig   acyclovir (ZOVIRAX) 200 MG capsule TAKE 2 CAPSULES BY MOUTH 2 TO 3 TIMES DAILY FOR 5 DAYS AS NEEDED   albuterol (VENTOLIN HFA) 108 (90 Base) MCG/ACT inhaler Inhale 2 puffs into the lungs every 4 (four) hours as needed for wheezing or shortness of breath.   citalopram (CELEXA) 10 MG tablet Take 2 tablets (20 mg total) by mouth daily.   desonide (DESOWEN)  0.05 % ointment As needed   fexofenadine (ALLEGRA) 180 MG tablet Take 180 mg by mouth daily. Over the counter.   fluticasone (FLOVENT HFA) 110 MCG/ACT inhaler Inhale 2 puffs into the lungs 2 (two) times daily.   mupirocin ointment (BACTROBAN) 2 % Apply 1 Application topically 2 (two) times daily.   pantoprazole (PROTONIX) 40 MG tablet Take 1 tablet (40 mg total) by mouth daily.   promethazine (PHENERGAN) 25 MG tablet Take 1q8h prn N&V with Migraines   promethazine-dextromethorphan (PROMETHAZINE-DM) 6.25-15 MG/5ML syrup Take 2.5 mLs by mouth 3 (three) times daily as needed for cough (sedation precautions).   simvastatin (ZOCOR) 20 MG tablet Take 1 tablet (20 mg total) by mouth at bedtime.   SUMAtriptan (IMITREX) 50 MG tablet Take 1 tablet at onset of migraine, may repeat in 2 hours if needed. No more than 2 doses in 24 hours   triamcinolone (KENALOG) 0.1 % paste Use as directed 1 application. in the mouth or throat 2 (two) times daily.   Vitamin D, Ergocalciferol, (DRISDOL) 1.25 MG (50000 UNIT) CAPS capsule Take 1 capsule (50,000 Units total) by mouth every 7 (seven) days.   [DISCONTINUED] diclofenac (VOLTAREN) 75 MG EC tablet Take 1 tablet (75 mg total) by mouth 2 (two) times daily.   [DISCONTINUED] meloxicam (MOBIC) 15 MG tablet Take 1 tablet by mouth once daily   meloxicam (MOBIC) 15 MG tablet Take 1 tablet (15 mg total) by mouth daily.   No facility-administered encounter medications on file as of 02/25/2023.

## 2023-02-25 NOTE — Patient Instructions (Signed)
Voltaren 1% gel, over the counter ?You can apply up to 4 times a day ? ?This can be applied to any joint: knee, wrist, fingers, elbows, shoulders, feet and ankles. ?Can apply to any tendon: tennis elbow, achilles, tendon, rotator cuff or any other tendon. ? ?Minimal is absorbed in the bloodstream: ok with oral anti-inflammatory or a blood thinner. ? ?Cost is about 9 dollars  ?

## 2023-02-26 NOTE — Telephone Encounter (Signed)
I have noted these messages.  Unfortunately, if her insurance does not cover medications my hands are tied.  Recommend that she speak to her insurance representative and explain her situation.  She does need inhalers for her asthma.

## 2023-03-03 ENCOUNTER — Encounter: Payer: Self-pay | Admitting: Pulmonary Disease

## 2023-03-03 NOTE — Telephone Encounter (Signed)
Download has been printed.  

## 2023-03-03 NOTE — Telephone Encounter (Signed)
Her compliance looks good.  100%.   She has an AUTO CPAP that goes from 5-15.  It varies to pressure according to how she is sleeping.  Some days she will require less pressure of the days more.  If she found that the CPAP was set at 15 it was probably a day when she was perhaps snoring more or that the nasal pillows slipped.  Adjusting the nasal pillows should help with that.  I would recommend we make her an appointment with sleep provider.

## 2023-03-30 ENCOUNTER — Other Ambulatory Visit: Payer: Self-pay | Admitting: Nurse Practitioner

## 2023-03-30 DIAGNOSIS — E785 Hyperlipidemia, unspecified: Secondary | ICD-10-CM

## 2023-03-30 DIAGNOSIS — F4310 Post-traumatic stress disorder, unspecified: Secondary | ICD-10-CM

## 2023-03-30 DIAGNOSIS — F411 Generalized anxiety disorder: Secondary | ICD-10-CM

## 2023-03-31 ENCOUNTER — Encounter: Payer: Self-pay | Admitting: Nurse Practitioner

## 2023-03-31 DIAGNOSIS — E785 Hyperlipidemia, unspecified: Secondary | ICD-10-CM

## 2023-03-31 MED ORDER — CITALOPRAM HYDROBROMIDE 20 MG PO TABS: 20.0000 mg | ORAL_TABLET | Freq: Every day | ORAL | 1 refills | Status: DC

## 2023-03-31 MED ORDER — SIMVASTATIN 20 MG PO TABS
20.0000 mg | ORAL_TABLET | Freq: Every day | ORAL | 3 refills | Status: DC
Start: 2023-03-31 — End: 2024-02-25

## 2023-04-21 ENCOUNTER — Encounter: Payer: Self-pay | Admitting: Pulmonary Disease

## 2023-04-22 NOTE — Telephone Encounter (Signed)
Spoke to patient via telephone. She reports of headache, mild chest discomfort in middle of chest, sore thoart and lungs hurt. Overall sx have improved but are still present.   Spo2 maintaining 95%.  Dr. Jayme Cloud, please advise. Thanks

## 2023-04-22 NOTE — Telephone Encounter (Signed)
Really nothing to do at this point.  The gas is an irritant to the upper airway but this should resolve with avoidance of further exposure.

## 2023-04-22 NOTE — Telephone Encounter (Signed)
Dr. Gonzalez, please advise. Thanks 

## 2023-05-04 ENCOUNTER — Encounter: Payer: Self-pay | Admitting: Pulmonary Disease

## 2023-05-04 ENCOUNTER — Ambulatory Visit: Payer: Commercial Managed Care - HMO | Admitting: Pulmonary Disease

## 2023-05-04 VITALS — BP 122/78 | HR 86 | Temp 98.0°F | Ht 63.5 in | Wt 200.8 lb

## 2023-05-04 DIAGNOSIS — J4540 Moderate persistent asthma, uncomplicated: Secondary | ICD-10-CM | POA: Diagnosis not present

## 2023-05-04 DIAGNOSIS — R0602 Shortness of breath: Secondary | ICD-10-CM | POA: Diagnosis not present

## 2023-05-04 DIAGNOSIS — G4733 Obstructive sleep apnea (adult) (pediatric): Secondary | ICD-10-CM

## 2023-05-04 LAB — NITRIC OXIDE: Nitric Oxide: 42

## 2023-05-04 MED ORDER — ARNUITY ELLIPTA 200 MCG/ACT IN AEPB: 1.0000 | INHALATION_SPRAY | Freq: Every day | RESPIRATORY_TRACT | 11 refills | Status: DC

## 2023-05-04 NOTE — Patient Instructions (Signed)
Increased your Arnuity strength to 200 mcg daily.  This is 1 puff daily.  Make sure you rinse your mouth well after you use it.  You may still use your albuterol as needed.  Continue using your CPAP.  Your compliance was between 83 to 90% which is good.  We will see him in follow-up in 3 months time call sooner should any new problems arise.
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
## 2023-05-05 ENCOUNTER — Other Ambulatory Visit: Payer: Self-pay

## 2023-05-05 ENCOUNTER — Encounter: Payer: Self-pay | Admitting: Pulmonary Disease

## 2023-05-05 MED ORDER — ARNUITY ELLIPTA 200 MCG/ACT IN AEPB: 1.0000 | INHALATION_SPRAY | Freq: Every day | RESPIRATORY_TRACT | 3 refills | Status: DC

## 2023-05-12 ENCOUNTER — Encounter: Payer: Self-pay | Admitting: Pulmonary Disease

## 2023-05-12 NOTE — Telephone Encounter (Signed)
 Dr. Gonzalez, please advise. thanks 

## 2023-05-13 NOTE — Telephone Encounter (Signed)
PA team, please advise on cheaper alternative to Arnuity.

## 2023-05-13 NOTE — Telephone Encounter (Signed)
She had FeNO performed during her most recent visit.  This was after being on the Arnuity 100 for a while and it was still elevated.  We can have the pharmacy team check to see if there is another inhaled corticosteroid that is any cheaper.

## 2023-05-14 ENCOUNTER — Other Ambulatory Visit (HOSPITAL_COMMUNITY): Payer: Self-pay

## 2023-05-14 NOTE — Telephone Encounter (Signed)
Patient has a deductible to meet contributing to the high co-pays, patient will have to pay this off before medications will be cheaper. No cheaper alternative in the same class while patient still has the deductible to meet.

## 2023-06-21 ENCOUNTER — Other Ambulatory Visit: Payer: Self-pay | Admitting: Obstetrics and Gynecology

## 2023-06-21 DIAGNOSIS — Z1231 Encounter for screening mammogram for malignant neoplasm of breast: Secondary | ICD-10-CM

## 2023-08-03 ENCOUNTER — Telehealth: Payer: Self-pay | Admitting: Gastroenterology

## 2023-08-03 NOTE — Telephone Encounter (Signed)
Pt requesting call back to discuss when her next appt should be

## 2023-08-03 NOTE — Telephone Encounter (Signed)
Patient called in and left a voicemail. I called her back she wanted to schedule her yearly check up I informed her our schedule is not open that far yet and to call us back tomorrow.

## 2023-08-03 NOTE — Telephone Encounter (Signed)
Left message on voicemail  Pt is already on recall for annual f/u for Feb 2025 and will receive letter in Dec as a reminder  Pt only needs an OV before if she is having concerns

## 2023-08-09 ENCOUNTER — Encounter: Payer: Self-pay | Admitting: Pulmonary Disease

## 2023-08-09 ENCOUNTER — Ambulatory Visit (INDEPENDENT_AMBULATORY_CARE_PROVIDER_SITE_OTHER): Payer: Managed Care, Other (non HMO) | Admitting: Pulmonary Disease

## 2023-08-09 VITALS — BP 124/78 | HR 81 | Temp 97.1°F | Ht 63.5 in | Wt 194.6 lb

## 2023-08-09 DIAGNOSIS — R0602 Shortness of breath: Secondary | ICD-10-CM | POA: Diagnosis not present

## 2023-08-09 DIAGNOSIS — J4540 Moderate persistent asthma, uncomplicated: Secondary | ICD-10-CM

## 2023-08-09 DIAGNOSIS — G4733 Obstructive sleep apnea (adult) (pediatric): Secondary | ICD-10-CM

## 2023-08-09 LAB — NITRIC OXIDE: Nitric Oxide: 30

## 2023-08-09 MED ORDER — QVAR REDIHALER 80 MCG/ACT IN AERB: 2.0000 | INHALATION_SPRAY | Freq: Two times a day (BID) | RESPIRATORY_TRACT | 11 refills | Status: DC

## 2023-08-09 MED ORDER — ALBUTEROL SULFATE HFA 108 (90 BASE) MCG/ACT IN AERS: 2.0000 | INHALATION_SPRAY | RESPIRATORY_TRACT | 2 refills | Status: AC | PRN
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
## 2023-08-31 ENCOUNTER — Other Ambulatory Visit: Payer: Self-pay | Admitting: Family Medicine

## 2023-08-31 ENCOUNTER — Other Ambulatory Visit: Payer: Self-pay | Admitting: Nurse Practitioner

## 2023-10-07 ENCOUNTER — Telehealth: Payer: Self-pay | Admitting: Gastroenterology

## 2023-10-07 NOTE — Telephone Encounter (Signed)
Patient called in left a voicemail requesting to schedule her yearly follow up. I called the patient back to inform her we receive her message, and I explain DR. Wohl schedule is currently not open due to scheduling at the hospital. The patient said she is going to wait on Dr. Servando Snare new schedule.

## 2023-11-01 ENCOUNTER — Ambulatory Visit
Admission: RE | Admit: 2023-11-01 | Discharge: 2023-11-01 | Disposition: A | Discharge status: Home / Self Care | Payer: Managed Care, Other (non HMO) | Source: Ambulatory Visit | Attending: Obstetrics and Gynecology | Admitting: Obstetrics and Gynecology

## 2023-11-01 DIAGNOSIS — Z1231 Encounter for screening mammogram for malignant neoplasm of breast: Secondary | ICD-10-CM

## 2023-11-24 ENCOUNTER — Telehealth: Payer: Self-pay | Admitting: Gastroenterology

## 2023-11-24 NOTE — Telephone Encounter (Signed)
Called pt and lmovm offering f/u appt with Danielle Strickland

## 2023-11-24 NOTE — Telephone Encounter (Signed)
Pt requesting call back having chronic pain

## 2023-11-24 NOTE — Telephone Encounter (Signed)
Pt is aware of appt, reminder mailed to pt

## 2023-11-28 ENCOUNTER — Other Ambulatory Visit: Payer: Self-pay | Admitting: Nurse Practitioner

## 2023-11-30 ENCOUNTER — Encounter: Payer: Self-pay | Admitting: Family Medicine

## 2023-11-30 MED ORDER — MELOXICAM 15 MG PO TABS: 15.0000 mg | ORAL_TABLET | Freq: Every day | ORAL | 1 refills | Status: DC

## 2023-12-02 ENCOUNTER — Encounter: Payer: Self-pay | Admitting: Nurse Practitioner

## 2023-12-02 ENCOUNTER — Other Ambulatory Visit: Payer: Self-pay | Admitting: Nurse Practitioner

## 2023-12-02 DIAGNOSIS — A6000 Herpesviral infection of urogenital system, unspecified: Secondary | ICD-10-CM

## 2023-12-02 MED ORDER — ACYCLOVIR 200 MG PO CAPS: ORAL_CAPSULE | ORAL | 1 refills | Status: DC

## 2023-12-02 NOTE — Telephone Encounter (Signed)
 Last office Visit: 02/17/23 Next office visit: nothing scheduled Last refill date:  Acyclovir  200 MG Oral Capsule 07/03/22 60 capsules 0 refills

## 2023-12-22 ENCOUNTER — Encounter: Payer: Self-pay | Admitting: Internal Medicine

## 2023-12-22 ENCOUNTER — Ambulatory Visit: Payer: Commercial Managed Care - HMO | Admitting: Internal Medicine

## 2023-12-22 VITALS — BP 130/80 | HR 85 | Temp 98.3°F | Wt 197.0 lb

## 2023-12-22 DIAGNOSIS — B353 Tinea pedis: Secondary | ICD-10-CM | POA: Insufficient documentation

## 2023-12-22 MED ORDER — FLUCONAZOLE 150 MG PO TABS
150.0000 mg | ORAL_TABLET | ORAL | 1 refills | Status: DC
Start: 2023-12-22 — End: 2024-04-20
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
## 2023-12-22 NOTE — Assessment & Plan Note (Addendum)
 Zeasorb has helped--reasonable to continue this Will treat also with fluconazole 150mg  every other day for a week (with refill) No evidence of bacterial secondary infection Next step is derm evaluation if doesn't improve

## 2023-12-26 NOTE — Progress Notes (Unsigned)
 Celso Amy, PA-C [address]  [address]  [place], [place] [address]  Main: [phone]  Fax: [phone]   Primary Care Physician: Eden Emms, NP  Primary Gastroenterologist:  Celso Amy, PA-C / Dr. Midge Minium    CC:  HPI: HAIDEE STOGSDILL is a 58 y.o. female  Current Outpatient Medications  Medication Sig Dispense Refill   acyclovir (ZOVIRAX) 200 MG capsule TAKE 2 CAPSULES BY MOUTH 2 TO 3 TIMES DAILY FOR 5 DAYS AS NEEDED 60 capsule 1   albuterol (VENTOLIN HFA) 108 (90 Base) MCG/ACT inhaler Inhale 2 puffs into the lungs every 4 (four) hours as needed for wheezing or shortness of breath. 18 g 2   beclomethasone (QVAR REDIHALER) 80 MCG/ACT inhaler Inhale 2 puffs into the lungs 2 (two) times daily. Rinse mouth well after use. 1 each 11   citalopram (CELEXA) 20 MG tablet Take 1 tablet by mouth once daily 90 tablet 0   desonide (DESOWEN) 0.05 % ointment As needed     fexofenadine (ALLEGRA) 180 MG tablet Take 180 mg by mouth daily. Over the counter.     fluconazole (DIFLUCAN) 150 MG tablet Take 1 tablet (150 mg total) by mouth every other day. 4 tablet 1   meloxicam (MOBIC) 15 MG tablet Take 1 tablet (15 mg total) by mouth daily. 90 tablet 1   mupirocin ointment (BACTROBAN) 2 % Apply 1 Application topically 2 (two) times daily. 22 g 0   pantoprazole (PROTONIX) 40 MG tablet Take 1 tablet (40 mg total) by mouth daily. 30 tablet 11   promethazine (PHENERGAN) 25 MG tablet Take 1q8h prn N&V with Migraines 30 tablet 0   promethazine-dextromethorphan (PROMETHAZINE-DM) 6.25-15 MG/5ML syrup Take 2.5 mLs by mouth 3 (three) times daily as needed for cough (sedation precautions). 118 mL 0   simvastatin (ZOCOR) 20 MG tablet Take 1 tablet (20 mg total) by mouth at bedtime. 90 tablet 3   SUMAtriptan (IMITREX) 50 MG tablet Take 1 tablet at onset of migraine, may repeat in 2 hours if needed. No more than 2 doses in 24 hours 10 tablet 2   triamcinolone (KENALOG) 0.1 % paste Use as  directed 1 application. in the mouth or throat 2 (two) times daily. 5 g 2   Vitamin D, Ergocalciferol, (DRISDOL) 1.25 MG (50000 UNIT) CAPS capsule Take 1 capsule (50,000 Units total) by mouth every 7 (seven) days. 12 capsule 0   No current facility-administered medications for this visit.    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   History of migraine    HPV in female    IBS (irritable bowel syndrome)    Liver disease    H/O hepatitis   Mental disorder    anxiety   Microscopic hematuria    Migraines    Peptic ulcer disease    Skin cancer    left thigh   Tobacco abuse 02/21/2013    Past Surgical History:  Procedure Laterality Date   ABDOMINAL HYSTERECTOMY  APPENDECTOMY     CHOLECYSTECTOMY N/A 11/26/2015   Procedure: LAPAROSCOPIC CHOLECYSTECTOMY ;  Surgeon: Leafy Ro, MD;  Location: [facility] ORS;  Service: General;  Laterality: N/A;   COLONOSCOPY WITH PROPOFOL N/A 12/09/2021   Procedure: COLONOSCOPY WITH PROPOFOL;  Surgeon: Midge Minium, MD;  Location: [facility] ENDOSCOPY;  Service: Endoscopy;  Laterality: N/A;   laparoscopically assisted vaginal hysterectomy  2006   SALIVARY GLAND SURGERY     submandular     TUBAL LIGATION     WISDOM TOOTH EXTRACTION      Review of Systems:    All systems reviewed and negative except where noted in HPI.   Physical Examination:   There were no vitals taken for this visit.  General: Well-nourished, well-developed in no acute distress.  Lungs: Clear to auscultation  bilaterally. Non-labored. Heart: Regular rate and rhythm, no murmurs rubs or gallops.  Abdomen: Bowel sounds are normal; Abdomen is Soft; No hepatosplenomegaly, masses or hernias;  No Abdominal Tenderness; No guarding or rebound tenderness. Neuro: Alert and oriented x 3.  Grossly intact.  Psych: Alert and cooperative, normal mood and affect.   Imaging Studies: No results found.  Assessment and Plan:   MISK GALENTINE is a 58 y.o. y/o female ***    Celso Amy, PA-C  Follow up ***  BP check ***

## 2023-12-27 ENCOUNTER — Encounter: Payer: Self-pay | Admitting: Physician Assistant

## 2023-12-27 ENCOUNTER — Ambulatory Visit: Payer: Commercial Managed Care - HMO | Admitting: Physician Assistant

## 2023-12-27 VITALS — BP 115/69 | HR 88 | Temp 98.1°F | Ht 63.5 in | Wt 197.0 lb

## 2023-12-27 DIAGNOSIS — E559 Vitamin D deficiency, unspecified: Secondary | ICD-10-CM | POA: Diagnosis not present

## 2023-12-27 DIAGNOSIS — Z79899 Other long term (current) drug therapy: Secondary | ICD-10-CM | POA: Diagnosis not present

## 2023-12-27 DIAGNOSIS — K219 Gastro-esophageal reflux disease without esophagitis: Secondary | ICD-10-CM

## 2023-12-27 MED ORDER — PANTOPRAZOLE SODIUM 40 MG PO TBEC: 40.0000 mg | DELAYED_RELEASE_TABLET | Freq: Every day | ORAL | 3 refills | Status: AC

## 2023-12-28 ENCOUNTER — Encounter: Payer: Self-pay | Admitting: Physician Assistant

## 2023-12-28 LAB — IRON: Iron: 102 ug/dL (ref 27–159)

## 2023-12-28 LAB — BASIC METABOLIC PANEL
BUN/Creatinine Ratio: 15 (ref 12–28)
BUN: 12 mg/dL (ref 8–27)
CO2: 22 mmol/L (ref 20–29)
Calcium: 9.5 mg/dL (ref 8.7–10.3)
Chloride: 103 mmol/L (ref 96–106)
Creatinine, Ser: 0.8 mg/dL (ref 0.57–1.00)
Glucose: 110 mg/dL — ABNORMAL HIGH (ref 70–99)
Potassium: 4.1 mmol/L (ref 3.5–5.2)
Sodium: 139 mmol/L (ref 134–144)
eGFR: 84 mL/min/{1.73_m2} (ref >59)

## 2023-12-28 LAB — VITAMIN D 25 HYDROXY (VIT D DEFICIENCY, FRACTURES): Vit D, 25-Hydroxy: 16.2 ng/mL — ABNORMAL LOW (ref 30.0–100.0)

## 2023-12-28 LAB — VITAMIN B12: Vitamin B-12: 299 pg/mL (ref 232–1245)

## 2023-12-28 LAB — MAGNESIUM: Magnesium: 2.1 mg/dL (ref 1.6–2.3)

## 2023-12-31 ENCOUNTER — Encounter: Payer: Self-pay | Admitting: Acute Care

## 2024-01-31 ENCOUNTER — Other Ambulatory Visit: Payer: Self-pay | Admitting: Nurse Practitioner

## 2024-01-31 ENCOUNTER — Encounter: Payer: Self-pay | Admitting: Nurse Practitioner

## 2024-01-31 DIAGNOSIS — G43709 Chronic migraine without aura, not intractable, without status migrainosus: Secondary | ICD-10-CM

## 2024-02-01 MED ORDER — PROMETHAZINE HCL 25 MG PO TABS: ORAL_TABLET | ORAL | 1 refills | Status: AC

## 2024-02-07 ENCOUNTER — Ambulatory Visit
Admission: RE | Admit: 2024-02-07 | Discharge: 2024-02-07 | Disposition: A | Discharge status: Home / Self Care | Source: Ambulatory Visit | Attending: Acute Care | Admitting: Acute Care

## 2024-02-07 DIAGNOSIS — Z122 Encounter for screening for malignant neoplasm of respiratory organs: Secondary | ICD-10-CM

## 2024-02-07 DIAGNOSIS — Z87891 Personal history of nicotine dependence: Secondary | ICD-10-CM

## 2024-02-24 ENCOUNTER — Other Ambulatory Visit: Payer: Self-pay | Admitting: Nurse Practitioner

## 2024-02-24 DIAGNOSIS — E785 Hyperlipidemia, unspecified: Secondary | ICD-10-CM

## 2024-02-29 ENCOUNTER — Telehealth: Payer: Self-pay

## 2024-02-29 NOTE — Telephone Encounter (Signed)
 Copied from CRM [id number]. Topic: Clinical - Lab/Test Results >> Feb 29, 2024 10:08 AM Danielle Strickland wrote: Reason for CRM: Patient concern about CT results and would like a call from office to discuss.  Patient states she was told results are completed, however she cannot see them in Mychart and becoming alarmed.

## 2024-03-01 ENCOUNTER — Other Ambulatory Visit: Payer: Self-pay

## 2024-03-01 DIAGNOSIS — Z87891 Personal history of nicotine dependence: Secondary | ICD-10-CM

## 2024-03-01 DIAGNOSIS — Z122 Encounter for screening for malignant neoplasm of respiratory organs: Secondary | ICD-10-CM

## 2024-03-01 NOTE — Telephone Encounter (Signed)
 Called and spoke with patient. Advised result letter was sent this morning to her mychart. She has no additional questions. Pt will complete annual scan again next year.

## 2024-03-30 ENCOUNTER — Encounter: Admitting: Nurse Practitioner

## 2024-03-30 ENCOUNTER — Encounter: Payer: Self-pay | Admitting: Nurse Practitioner

## 2024-03-30 ENCOUNTER — Other Ambulatory Visit: Payer: Self-pay | Admitting: Medical Genetics

## 2024-03-30 ENCOUNTER — Ambulatory Visit (INDEPENDENT_AMBULATORY_CARE_PROVIDER_SITE_OTHER): Admitting: Nurse Practitioner

## 2024-03-30 VITALS — BP 102/70 | HR 80 | Temp 97.8°F | Ht 63.0 in | Wt 195.8 lb

## 2024-03-30 DIAGNOSIS — Z0000 Encounter for general adult medical examination without abnormal findings: Secondary | ICD-10-CM | POA: Diagnosis not present

## 2024-03-30 DIAGNOSIS — M79671 Pain in right foot: Secondary | ICD-10-CM

## 2024-03-30 DIAGNOSIS — Z87891 Personal history of nicotine dependence: Secondary | ICD-10-CM | POA: Diagnosis not present

## 2024-03-30 DIAGNOSIS — Z131 Encounter for screening for diabetes mellitus: Secondary | ICD-10-CM

## 2024-03-30 DIAGNOSIS — K219 Gastro-esophageal reflux disease without esophagitis: Secondary | ICD-10-CM

## 2024-03-30 DIAGNOSIS — F411 Generalized anxiety disorder: Secondary | ICD-10-CM

## 2024-03-30 DIAGNOSIS — Z13820 Encounter for screening for osteoporosis: Secondary | ICD-10-CM

## 2024-03-30 DIAGNOSIS — M79672 Pain in left foot: Secondary | ICD-10-CM | POA: Diagnosis not present

## 2024-03-30 DIAGNOSIS — I700 Atherosclerosis of aorta: Secondary | ICD-10-CM | POA: Diagnosis not present

## 2024-03-30 DIAGNOSIS — G43009 Migraine without aura, not intractable, without status migrainosus: Secondary | ICD-10-CM

## 2024-03-30 DIAGNOSIS — J432 Centrilobular emphysema: Secondary | ICD-10-CM

## 2024-03-30 DIAGNOSIS — G479 Sleep disorder, unspecified: Secondary | ICD-10-CM

## 2024-03-30 DIAGNOSIS — E559 Vitamin D deficiency, unspecified: Secondary | ICD-10-CM | POA: Diagnosis not present

## 2024-03-30 LAB — COMPREHENSIVE METABOLIC PANEL WITH GFR
ALT: 16 U/L (ref 0–35)
AST: 16 U/L (ref 0–37)
Albumin: 4.4 g/dL (ref 3.5–5.2)
Alkaline Phosphatase: 87 U/L (ref 39–117)
BUN: 17 mg/dL (ref 6–23)
CO2: 24 meq/L (ref 19–32)
Calcium: 9.4 mg/dL (ref 8.4–10.5)
Chloride: 104 meq/L (ref 96–112)
Creatinine, Ser: 0.74 mg/dL (ref 0.40–1.20)
GFR: 87.97 mL/min (ref >60.00)
Glucose, Bld: 107 mg/dL — ABNORMAL HIGH (ref 70–99)
Potassium: 4.3 meq/L (ref 3.5–5.1)
Sodium: 138 meq/L (ref 135–145)
Total Bilirubin: 0.6 mg/dL (ref 0.2–1.2)
Total Protein: 7.4 g/dL (ref 6.0–8.3)

## 2024-03-30 LAB — CBC
HCT: 38.5 % (ref 36.0–46.0)
Hemoglobin: 13.4 g/dL (ref 12.0–15.0)
MCHC: 34.7 g/dL (ref 30.0–36.0)
MCV: 93.2 fl (ref 78.0–100.0)
Platelets: 289 K/uL (ref 150.0–400.0)
RBC: 4.13 Mil/uL (ref 3.87–5.11)
RDW: 13.1 % (ref 11.5–15.5)
WBC: 5.2 K/uL (ref 4.0–10.5)

## 2024-03-30 LAB — URINALYSIS, MICROSCOPIC ONLY

## 2024-03-30 LAB — LIPID PANEL
Cholesterol: 204 mg/dL — ABNORMAL HIGH (ref 0–200)
HDL: 45.9 mg/dL (ref >39.00)
LDL Cholesterol: 103 mg/dL — ABNORMAL HIGH (ref 0–99)
NonHDL: 158.22
Total CHOL/HDL Ratio: 4
Triglycerides: 277 mg/dL — ABNORMAL HIGH (ref 0.0–149.0)
VLDL: 55.4 mg/dL — ABNORMAL HIGH (ref 0.0–40.0)

## 2024-03-30 LAB — HEMOGLOBIN A1C: Hgb A1c MFr Bld: 5.7 % (ref 4.6–6.5)

## 2024-03-30 LAB — VITAMIN D 25 HYDROXY (VIT D DEFICIENCY, FRACTURES): VITD: 21.57 ng/mL — ABNORMAL LOW (ref 30.00–100.00)

## 2024-03-30 LAB — TSH: TSH: 5.89 u[IU]/mL — ABNORMAL HIGH (ref 0.35–5.50)

## 2024-03-30 MED ORDER — TRIAMCINOLONE ACETONIDE 0.1 % EX CREA: 1.0000 | TOPICAL_CREAM | Freq: Two times a day (BID) | CUTANEOUS | 0 refills | Status: DC

## 2024-03-30 MED ORDER — HYDROXYZINE HCL 10 MG PO TABS: 10.0000 mg | ORAL_TABLET | Freq: Three times a day (TID) | ORAL | 0 refills | Status: AC | PRN

## 2024-03-30 NOTE — Patient Instructions (Signed)
 Nice to see you today Use the steroid cream twice a day for NO more than 7-10 days Follow up with me in 1 year, sooner if you need me

## 2024-03-30 NOTE — Assessment & Plan Note (Signed)
 Currently maintained on citalopram  20 mg daily will add on hydroxyzine  10 mg nightly up to 3 times daily as needed sedation precautions reviewed

## 2024-03-30 NOTE — Assessment & Plan Note (Signed)
 History of the same.  Patient currently maintained on Protonix  40 mg daily and is followed by GI

## 2024-03-30 NOTE — Assessment & Plan Note (Signed)
 Pending urine microscopy to rule out microscopic hematuria

## 2024-03-30 NOTE — Assessment & Plan Note (Signed)
History of the same.

## 2024-03-30 NOTE — Assessment & Plan Note (Signed)
 Patient currently maintained on simvastatin  20 mg daily.

## 2024-03-30 NOTE — Assessment & Plan Note (Signed)
 Patient was treated for tinea infection that resolved without having painful blisters come back we will treat with topical steroid triamcinolone  0.1% twice daily for 7 to 10 days.

## 2024-03-30 NOTE — Assessment & Plan Note (Signed)
 Discussed age-appropriate immunizations and screening tests.  Did review patient's personal, surgical, social, family histories.  Patient is up-to-date on all age-appropriate vaccinations she would like.  We did discuss pneumonia vaccine and she would like to think on this juncture.  Patient is up-to-date on CRC screening.  Lung cancer screening.  And breast cancer screening.  No longer participates in cervical cancer screening due to hysterectomy.  Order placed for bone density scan that can be completed when she gets her next mammogram done.  Patient was given information at discharge about preventative healthcare maintenance with anticipatory guidance.

## 2024-03-30 NOTE — Assessment & Plan Note (Signed)
 Incidental finding on LDCT.  Patient follow-up pulmonology and being treated more so of asthma.  Patient unable to afford any ICS inhalers.  We will put in a VBCI referral for pharmacy for possible PAP.

## 2024-03-30 NOTE — Assessment & Plan Note (Signed)
 1-2 migraines a month that is responsive to Imitrex .  Continue medication as prescribed

## 2024-03-30 NOTE — Assessment & Plan Note (Signed)
 Will try hydroxyzine  low-dose if no effect; up to 25 mg or considering trazodone as an alternative agent.

## 2024-03-30 NOTE — Progress Notes (Signed)
 Established Patient Office Visit  Subjective   Patient ID: Danielle Strickland, female    DOB: 04/04/1966  Age: 58 y.o. MRN: [id number]  Chief Complaint  Patient presents with   Annual Exam    Pt received both shingles vaccines. Has not had pneumonia vaccine.     HPI   for complete physical and follow up of chronic conditions.  Migraine: Patient currently maintained on sumatriptan  50 mg as needed.  Will get headaches approximately 1-2 a month  Anxiety/MDD: Patient currently maintained on citalopram  20 mg daily. States that she is thankfull for the [employer]. States when she wakes up she is tremblig. States that she feels almost startled. States that her granddaughter will do that when she is anxious. States that she does not want to go to work because she is "beyond tired" States that at work she is juping her leg and can't sit still.  Emphysema: Patient currently maintained on Qvar  80 mcg 2 puffs twice daily and albuterol .  She is followed by pulmonology currently. States that she is   GERD: Patient currently maintained on Protonix  40 mg daily  HLD: Patient currently maintained on simvastatin  20 mg daily  Skin cancer: History of the same see dermatology yearly  Immunizations: -Tetanus: Completed in 2023 -Influenza: Out of season -Shingles:completed  -Pneumonia:?  Diet: Fair diet.  She will eat 2-3 meals daily. She is snacking. She is drinking coffee in the am. She is doing some water and coke zero Exercise: No regular exercise. 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  Wears contacts she sees [facility] yearly for history of separated retina Dental exam: Completes semi-annually    Colonoscopy: Completed in 12/09/2021 with recall in 2028  Lung Cancer Screening: Completed in 02/07/2024 recall 1 year  Pap smear: Hysterectomy  Mammogram: 11/01/2023  DEXA: needs done  Sleep:states that she is having trouble getting asleep. States  that she will get up a couple times a night. States that she has trouble waking       Review of Systems  Constitutional:  Negative for chills and fever.  Respiratory:  Positive for shortness of breath.   Cardiovascular:  Negative for chest pain and leg swelling.  Gastrointestinal:  Negative for abdominal pain, blood in stool, constipation, diarrhea, nausea and vomiting.       BM very infrequent but followed by GI  States that now she is going daily  Genitourinary:  Negative for dysuria and hematuria.  Neurological:  Positive for headaches. Negative for tingling.  Psychiatric/Behavioral:  Negative for hallucinations and suicidal ideas. The patient has insomnia.       Objective:     BP 102/70   Pulse 80   Temp 97.8 F (36.6 C) (Oral)   Ht 5\' 3"  (1.6 m)   Wt 195 lb 12.8 oz (88.8 kg)   SpO2 94%   BMI 34.68 kg/m  BP Readings from Last 3 Encounters:  03/30/24 102/70  12/27/23 115/69  12/22/23 130/80   Wt Readings from Last 3 Encounters:  03/30/24 195 lb 12.8 oz (88.8 kg)  12/27/23 197 lb (89.4 kg)  12/22/23 197 lb (89.4 kg)   SpO2 Readings from Last 3 Encounters:  03/30/24 94%  12/22/23 95%  08/09/23 97%      Physical Exam Vitals and nursing note reviewed.  Constitutional:      Appearance: Normal appearance.  HENT:     Right Ear: Tympanic membrane, ear canal and external  ear normal.     Left Ear: Tympanic membrane, ear canal and external ear normal.     Mouth/Throat:     Mouth: Mucous membranes are moist.     Pharynx: Oropharynx is clear.  Eyes:     Extraocular Movements: Extraocular movements intact.     Pupils: Pupils are equal, round, and reactive to light.  Cardiovascular:     Rate and Rhythm: Normal rate and regular rhythm.     Pulses: Normal pulses.     Heart sounds: Normal heart sounds.  Pulmonary:     Effort: Pulmonary effort is normal.     Breath sounds: Normal breath sounds.  Abdominal:     General: Bowel sounds are normal. There is no  distension.     Palpations: There is no mass.     Tenderness: There is no abdominal tenderness.     Hernia: No hernia is present.  Musculoskeletal:     Right lower leg: No edema.     Left lower leg: No edema.       Feet:  Lymphadenopathy:     Cervical: No cervical adenopathy.  Skin:    General: Skin is warm.     Findings: Rash present.  Neurological:     General: No focal deficit present.     Mental Status: She is alert.     Deep Tendon Reflexes:     Reflex Scores:      Bicep reflexes are 2+ on the right side and 2+ on the left side.      Patellar reflexes are 2+ on the right side and 2+ on the left side.    Comments: Bilateral upper and lower extremity strength 5/5  Psychiatric:        Mood and Affect: Mood normal.        Behavior: Behavior normal.        Thought Content: Thought content normal.        Judgment: Judgment normal.      No results found for any visits on 03/30/24.    The 10-year ASCVD risk score (Arnett DK, et al., 2019) is: 2.2%    Assessment & Plan:   Problem List Items Addressed This Visit       Cardiovascular and Mediastinum   Migraine   1-2 migraines a month that is responsive to Imitrex .  Continue medication as prescribed      Aortic atherosclerosis (HCC)   Patient currently maintained on simvastatin  20 mg daily.      Relevant Orders   Lipid panel     Respiratory   Centrilobular emphysema (HCC)   Incidental finding on LDCT.  Patient follow-up pulmonology and being treated more so of asthma.  Patient unable to afford any ICS inhalers.  We will put in a VBCI referral for pharmacy for possible PAP.      Relevant Orders   AMB Referral VBCI Care Management     Digestive   Gastroesophageal reflux disease   History of the same.  Patient currently maintained on Protonix  40 mg daily and is followed by GI        Other   Generalized anxiety disorder (Chronic)   Currently maintained on citalopram  20 mg daily will add on hydroxyzine  10 mg  nightly up to 3 times daily as needed sedation precautions reviewed      Relevant Medications   hydrOXYzine  (ATARAX ) 10 MG tablet   Other Relevant Orders   TSH   History of tobacco use   Pending  urine microscopy to rule out microscopic hematuria      Relevant Orders   Urine Microscopic   Preventative health care - Primary   Discussed age-appropriate immunizations and screening tests.  Did review patient's personal, surgical, social, family histories.  Patient is up-to-date on all age-appropriate vaccinations she would like.  We did discuss pneumonia vaccine and she would like to think on this juncture.  Patient is up-to-date on CRC screening.  Lung cancer screening.  And breast cancer screening.  No longer participates in cervical cancer screening due to hysterectomy.  Order placed for bone density scan that can be completed when she gets her next mammogram done.  Patient was given information at discharge about preventative healthcare maintenance with anticipatory guidance.      Relevant Orders   CBC   Comprehensive metabolic panel with GFR   Vitamin D  deficiency   History of the same       Relevant Orders   VITAMIN D  25 Hydroxy (Vit-D Deficiency, Fractures)   Pain in both feet   Patient was treated for tinea infection that resolved without having painful blisters come back we will treat with topical steroid triamcinolone  0.1% twice daily for 7 to 10 days.      Relevant Medications   triamcinolone  cream (KENALOG ) 0.1 %   Sleep disturbance   Will try hydroxyzine  low-dose if no effect; up to 25 mg or considering trazodone as an alternative agent.      Relevant Medications   hydrOXYzine  (ATARAX ) 10 MG tablet   Other Visit Diagnoses       Screening for diabetes mellitus       Relevant Orders   Hemoglobin A1c     Screening for osteoporosis       Relevant Orders   DG Bone Density       Return in about 1 year (around 03/30/2025) for CPE and Labs.    Margarie Shay, NP

## 2024-04-04 ENCOUNTER — Encounter: Payer: Self-pay | Admitting: Nurse Practitioner

## 2024-04-05 ENCOUNTER — Ambulatory Visit: Payer: Self-pay | Admitting: Nurse Practitioner

## 2024-04-05 DIAGNOSIS — F419 Anxiety disorder, unspecified: Secondary | ICD-10-CM

## 2024-04-05 DIAGNOSIS — M810 Age-related osteoporosis without current pathological fracture: Secondary | ICD-10-CM

## 2024-04-05 DIAGNOSIS — E559 Vitamin D deficiency, unspecified: Secondary | ICD-10-CM

## 2024-04-05 MED ORDER — VITAMIN D (ERGOCALCIFEROL) 1.25 MG (50000 UNIT) PO CAPS: 50000.0000 [IU] | ORAL_CAPSULE | ORAL | 0 refills | Status: AC

## 2024-04-10 ENCOUNTER — Telehealth: Payer: Self-pay | Admitting: Nurse Practitioner

## 2024-04-10 DIAGNOSIS — M79673 Pain in unspecified foot: Secondary | ICD-10-CM

## 2024-04-10 NOTE — Telephone Encounter (Signed)
-----   Message from [facility] sent at 03/30/2024  9:06 AM EDT ----- Regarding: meds See if steroid cream has helped the feet  See if hydroxyzine  has helped with sleep

## 2024-04-10 NOTE — Telephone Encounter (Signed)
 See if steroid cream has helped the feet   See if hydroxyzine  has helped with sleep

## 2024-04-11 NOTE — Telephone Encounter (Signed)
 Contacted pt.  Pt complains that the steroid cream did not help at all. Pt states that her skin is peeling, has blisters and hurts to stand and walk on feet.    Pt complains of hydroxyzine  helping but states that when she took the tablet Friday she slept for 11 hours straight.  Pt states of feeling relaxed but is concerned about how long she slept.  Pt wants to know if she can cut the pill in half because she cannot sleep that long due to work and other responsibilities.  Pt is unsure if long hours is due to being very sleep deprived or if the medication is too strong for her.  Asked pt if she felt groggy or sedated when waking up.  Pt responded with no, I felt great and my daughter said I was nicer

## 2024-04-12 NOTE — Telephone Encounter (Signed)
 Called pt.  Pt prefers  area for podiatrist.  Relayed information about cutting tablet of hydroxyzine  in half. Pt verbalized understanding and has no questions or concerns.

## 2024-04-12 NOTE — Addendum Note (Signed)
 Addended by: Dorothe Gaster on: 04/12/2024 03:10 PM   Modules accepted: Orders

## 2024-04-12 NOTE — Telephone Encounter (Signed)
 Discontinue the steroid cream to the feet let her know I think she needs to see a podiatrist and see if she wants to be seen in the [place] or [place].  She is more than welcome to cut the tablet in half to see if that helps with the length of sleeping in regards to the hydroxyzine 

## 2024-04-13 ENCOUNTER — Other Ambulatory Visit: Payer: Self-pay | Admitting: Nurse Practitioner

## 2024-04-17 ENCOUNTER — Telehealth: Payer: Self-pay

## 2024-04-17 NOTE — Progress Notes (Signed)
 Complex Care Management Note Care Guide Note  04/17/2024 Name: Danielle Strickland MRN: [id number] DOB: March 30, 1966   Complex Care Management Outreach Attempts: An unsuccessful telephone outreach was attempted today to offer the patient information about available complex care management services.  Follow Up Plan:  Additional outreach attempts will be made to offer the patient complex care management information and services.   Encounter Outcome:  No Answer  Dreama [facility]  [facility], [facility] Health Care Management Assistant Direct Dial: [phone]  Fax: [phone]
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
## 2024-04-20 ENCOUNTER — Ambulatory Visit: Admitting: Family Medicine

## 2024-04-20 ENCOUNTER — Encounter: Payer: Self-pay | Admitting: Family Medicine

## 2024-04-20 VITALS — BP 110/70 | HR 86 | Temp 99.1°F | Ht 63.0 in | Wt 196.4 lb

## 2024-04-20 DIAGNOSIS — B353 Tinea pedis: Secondary | ICD-10-CM | POA: Diagnosis not present

## 2024-04-20 DIAGNOSIS — M65312 Trigger thumb, left thumb: Secondary | ICD-10-CM

## 2024-04-20 DIAGNOSIS — G8929 Other chronic pain: Secondary | ICD-10-CM

## 2024-04-20 DIAGNOSIS — M79642 Pain in left hand: Secondary | ICD-10-CM | POA: Diagnosis not present

## 2024-04-20 DIAGNOSIS — M130 Polyarthritis, unspecified: Secondary | ICD-10-CM | POA: Diagnosis not present

## 2024-04-20 LAB — HIGH SENSITIVITY CRP: CRP, High Sensitivity: 4.39 mg/L (ref 0.000–5.000)

## 2024-04-20 LAB — SEDIMENTATION RATE: Sed Rate: 11 mm/h (ref 0–30)

## 2024-04-20 MED ORDER — TRIAMCINOLONE ACETONIDE 40 MG/ML IJ SUSP
20.0000 mg | Freq: Once | INTRAMUSCULAR | Status: AC
Start: 2024-04-20 — End: 2024-04-20
  Administered 2024-04-20: 20 mg via INTRA_ARTICULAR

## 2024-04-20 MED ORDER — FLUCONAZOLE 150 MG PO TABS: ORAL_TABLET | ORAL | 0 refills | Status: AC

## 2024-04-20 NOTE — Patient Instructions (Signed)
 Topical Clotrimazole - twice a day a month

## 2024-04-21 ENCOUNTER — Encounter: Payer: Self-pay | Admitting: Family Medicine

## 2024-04-24 ENCOUNTER — Encounter: Payer: Self-pay | Admitting: Family Medicine

## 2024-04-24 NOTE — Progress Notes (Unsigned)
 Complex Care Management Note Care Guide Note  04/24/2024 Name: Danielle Strickland MRN: [id number] DOB: 1966-08-21   Complex Care Management Outreach Attempts: A second unsuccessful outreach was attempted today to offer the patient with information about available complex care management services.  Follow Up Plan:  Additional outreach attempts will be made to offer the patient complex care management information and services.   Encounter Outcome:  No Answer  Dreama Lynwood [facility]  [facility], [facility] Health Care Management Assistant Direct Dial: [phone]  Fax: [phone]

## 2024-04-25 LAB — TIER 2
Jo-1 Autoabs: <1 AI
SSA (Ro) (ENA) Antibody, IgG: 7.1 AI — AB
SSB (La) (ENA) Antibody, IgG: 2.3 AI — AB
Scleroderma (Scl-70) (ENA) Antibody, IgG: <1 AI

## 2024-04-25 LAB — TIER 1
Chromatin (Nucleosomal) Antibody: <1 AI
ENA SM Ab Ser-aCnc: <1 AI
Ribonucleic Protein(ENA) Antibody, IgG: <1 AI
SM/RNP: <1 AI
ds DNA Ab: 3 [IU]/mL

## 2024-04-25 LAB — ANA SCREEN,IFA,REFLEX TITER/PATTERN,REFLEX MPLX 11 AB CASCADE
Anti Nuclear Antibody (ANA): POSITIVE — AB
Cyclic Citrullin Peptide Ab: <16 U
MUTATED CITRULLINATED VIMENTIN (MCV) AB: <20 U/mL (ref <20)
Rheumatoid fact SerPl-aCnc: <10 [IU]/mL (ref <14)

## 2024-04-25 LAB — ANTI-NUCLEAR AB-TITER (ANA TITER): ANA Titer 1: 1:320 {titer} — ABNORMAL HIGH

## 2024-04-25 LAB — INTERPRETATION

## 2024-04-26 ENCOUNTER — Ambulatory Visit: Payer: Self-pay | Admitting: Family Medicine

## 2024-04-26 DIAGNOSIS — R768 Other specified abnormal immunological findings in serum: Secondary | ICD-10-CM

## 2024-04-26 DIAGNOSIS — M2550 Pain in unspecified joint: Secondary | ICD-10-CM

## 2024-04-26 DIAGNOSIS — M329 Systemic lupus erythematosus, unspecified: Secondary | ICD-10-CM

## 2024-04-27 NOTE — Progress Notes (Signed)
 Complex Care Management Note Care Guide Note  04/27/2024 Name: Danielle Strickland MRN: [id number] DOB: Jul 06, 1966   Complex Care Management Outreach Attempts: A third unsuccessful outreach was attempted today to offer the patient with information about available complex care management services.  Follow Up Plan:  No further outreach attempts will be made at this time. We have been unable to contact the patient to offer or enroll patient in complex care management services.  Encounter Outcome:  No Answer  Dreama Lynwood [facility]  [facility], [facility] Health Care Management Assistant Direct Dial: [phone]  Fax: [phone]

## 2024-05-02 ENCOUNTER — Telehealth: Payer: Self-pay

## 2024-05-02 NOTE — Progress Notes (Signed)
 Complex Care Management Note  Care Guide Note 05/02/2024 Name: Danielle Strickland MRN: [id number] DOB: 1966/10/24  Danielle Strickland is a 57 y.o. year old female who sees Cable, Lynwood HERO, NP for primary care. I reached out to Danielle Strickland by phone today to offer complex care management services.  Danielle Strickland was given information about Complex Care Management services today including:   The Complex Care Management services include support from the care team which includes your Nurse Care Manager, Clinical Social Worker, or Pharmacist.  The Complex Care Management team is here to help remove barriers to the health concerns and goals most important to you. Complex Care Management services are voluntary, and the patient may decline or stop services at any time by request to their care team member.   Complex Care Management Consent Status: Patient agreed to services and verbal consent obtained.   Follow up plan:  Telephone appointment with complex care management team member scheduled for:  05/04/24 at 1:30 a.m.   Encounter Outcome:  Patient Scheduled  Dreama Lynwood [facility]  [facility], [facility] Health Care Management Assistant Direct Dial: [phone]  Fax: [phone]

## 2024-05-04 ENCOUNTER — Ambulatory Visit: Admitting: Podiatry

## 2024-05-04 ENCOUNTER — Other Ambulatory Visit

## 2024-05-31 ENCOUNTER — Other Ambulatory Visit

## 2024-06-02 ENCOUNTER — Telehealth: Payer: Self-pay

## 2024-06-02 NOTE — Progress Notes (Signed)
 Care Guide Pharmacy Note  06/02/2024 Name: Danielle Strickland MRN: [id number] DOB: 1966-07-30  Referred By: Wendee Lynwood HERO, NP Reason for referral: Complex Care Management and Call Attempt #1 (Unsuccessful Initial outreach to schedule with PHARM D)   Danielle Strickland is a 58 y.o. year old female who is a primary care patient of Wendee, Lynwood HERO, NP.  Danielle Strickland was referred to the pharmacist for assistance related to: Medication assistance  An unsuccessful telephone outreach was attempted today to contact the patient who was referred to the pharmacy team for assistance with medication assistance. Additional attempts will be made to contact the patient.  Danielle Strickland [facility], [facility] Guide  Direct Dial: [phone]  Fax [phone]

## 2024-06-05 NOTE — Progress Notes (Signed)
 Complex Care Management Note Care Guide Note  06/05/2024 Name: Danielle Strickland MRN: [id number] DOB: [birth year]   Complex Care Management Outreach Attempts: A second unsuccessful outreach was attempted today to offer the patient with information about available complex care management services.  Follow Up Plan:  Additional outreach attempts will be made to offer the patient complex care management information and services.   Encounter Outcome:  No Answer  Leotis Rase [facility], [facility] Guide  Direct Dial: [phone]  Fax [phone]

## 2024-06-06 NOTE — Progress Notes (Signed)
 Care Guide Pharmacy Note  06/06/2024 Name: SHILO PHILIPSON MRN: [id number] DOB: 11/15/1965  Referred By: Wendee Lynwood HERO, NP Reason for referral: Complex Care Management, Call Attempt #1 (Unsuccessful Initial outreach to schedule with PHARM D), Call Attempt #2 (Unsuccessful initial outreach to schedule with PHARM- JONETTA Shaver), and Call Attempt #3 (Unsuccessful initial outreach to schedule with PHARM D)   Delon SHAUNNA Must is a 58 y.o. year old female who is a primary care patient of Wendee, Lynwood HERO, NP.  Delon SHAUNNA July was referred to the pharmacist for assistance related to: medication assistance  A third unsuccessful telephone outreach was attempted today to contact the patient who was referred to the pharmacy team for assistance with medication assistance. The Population Health team is pleased to engage with this patient at any time in the future upon receipt of referral and should he/she be interested in assistance from the [facility] Health team.  Leotis Rase [facility]  [facility], [facility] Guide  Direct Dial: [phone]  Fax [phone]

## 2024-06-21 ENCOUNTER — Other Ambulatory Visit: Payer: Self-pay | Admitting: Obstetrics and Gynecology

## 2024-06-21 DIAGNOSIS — Z1231 Encounter for screening mammogram for malignant neoplasm of breast: Secondary | ICD-10-CM

## 2024-07-11 ENCOUNTER — Encounter: Payer: Self-pay | Admitting: Nurse Practitioner

## 2024-07-12 ENCOUNTER — Encounter: Payer: Self-pay | Admitting: Family Medicine

## 2024-07-20 ENCOUNTER — Encounter: Payer: Self-pay | Admitting: Nurse Practitioner

## 2024-07-20 ENCOUNTER — Ambulatory Visit
Admission: RE | Admit: 2024-07-20 | Discharge: 2024-07-20 | Disposition: A | Discharge status: Home / Self Care | Source: Ambulatory Visit | Attending: Nurse Practitioner | Admitting: Nurse Practitioner

## 2024-07-20 DIAGNOSIS — M810 Age-related osteoporosis without current pathological fracture: Secondary | ICD-10-CM | POA: Diagnosis not present

## 2024-07-20 DIAGNOSIS — Z13820 Encounter for screening for osteoporosis: Secondary | ICD-10-CM | POA: Insufficient documentation

## 2024-07-24 DIAGNOSIS — M810 Age-related osteoporosis without current pathological fracture: Secondary | ICD-10-CM | POA: Insufficient documentation

## 2024-07-24 MED ORDER — ALENDRONATE SODIUM 70 MG PO TABS: 70.0000 mg | ORAL_TABLET | ORAL | 11 refills | Status: AC

## 2024-07-24 NOTE — Addendum Note (Signed)
 Addended by: WENDEE LYNWOOD HERO on: 07/24/2024 01:51 PM   Modules accepted: Orders

## 2024-07-24 NOTE — Telephone Encounter (Signed)
-----   Message from [facility] T sent at 07/24/2024 10:05 AM EDT -----  ----- Message ----- From: Wendee Lynwood HERO, NP Sent: 07/24/2024   7:29 AM EDT To: Wendee Gunnels  Notified via My Chart   See if she is ok doing the fosamax for osteoporosis  ----- Message ----- From: Interface, Rad Results In Sent: 07/20/2024   2:46 PM EDT To: Lynwood HERO Wendee, NP

## 2024-07-26 ENCOUNTER — Encounter: Payer: Self-pay | Admitting: Family Medicine

## 2024-07-26 MED ORDER — BUSPIRONE HCL 5 MG PO TABS: 5.0000 mg | ORAL_TABLET | Freq: Two times a day (BID) | ORAL | 0 refills | Status: AC

## 2024-07-26 NOTE — Addendum Note (Signed)
 Addended by: WENDEE LYNWOOD HERO on: 07/26/2024 04:22 PM   Modules accepted: Orders

## 2024-07-26 NOTE — Telephone Encounter (Signed)
 This was originally routed to Adina Crandall, PCP

## 2024-08-01 ENCOUNTER — Other Ambulatory Visit: Payer: Self-pay | Admitting: Family Medicine

## 2024-08-01 ENCOUNTER — Encounter: Payer: Self-pay | Admitting: Nurse Practitioner

## 2024-08-01 ENCOUNTER — Encounter: Payer: Self-pay | Admitting: Family Medicine

## 2024-08-01 ENCOUNTER — Other Ambulatory Visit: Payer: Self-pay | Admitting: Nurse Practitioner

## 2024-08-01 DIAGNOSIS — E785 Hyperlipidemia, unspecified: Secondary | ICD-10-CM

## 2024-08-01 NOTE — Telephone Encounter (Signed)
 Last office visit 04/20/24 for Hand pain/joint swelling with Dr. Watt.  Last refilled 11/30/23 for #90 with 1 refill by Dr. Watt.  Next Appt: No future appointments with PCP or Dr. Watt.

## 2024-08-18 ENCOUNTER — Other Ambulatory Visit: Payer: Self-pay | Admitting: Medical Genetics

## 2024-08-18 DIAGNOSIS — Z006 Encounter for examination for normal comparison and control in clinical research program: Secondary | ICD-10-CM

## 2024-10-08 NOTE — Progress Notes (Unsigned)
° ° ° °  Danielle Ducey T. Deigo Alonso, MD, CAQ Sports Medicine [facility] at [facility] [address] [place], [address]  Phone: [phone]  FAX: [phone]  Danielle Strickland - 58 y.o. female  MRN [id number]  Date of Birth: 03-Dec-1965  Date: 10/09/2024  PCP: Wendee Lynwood HERO, NP  Referral: Wendee Lynwood HERO, NP  No chief complaint on file.  Subjective:   Danielle Strickland is a 58 y.o. very pleasant female patient with There is no height or weight on file to calculate BMI. who presents with the following:  Discussed the use of AI scribe software for clinical note transcription with the patient, who gave verbal consent to proceed.  I have known Caliope for many years.  She presents with some hearing concerns. History of Present Illness     Review of Systems is noted in the HPI, as appropriate  Objective:   There were no vitals taken for this visit.  GEN: No acute distress; alert,appropriate. PULM: Breathing comfortably in no respiratory distress PSYCH: Normally interactive.   Laboratory and Imaging Data:  Assessment and Plan:   No diagnosis found. Assessment & Plan   Medication Management during today's office visit: No orders of the defined types were placed in this encounter.  There are no discontinued medications.  Orders placed today for conditions managed today: No orders of the defined types were placed in this encounter.   Disposition: No follow-ups on file.  Dragon [employer] One speech-to-text software was used for transcription in this dictation.  Possible transcriptional errors can occur using Animal nutritionist.   Signed,  Jacques DASEN. Marquan Vokes, MD   Outpatient Encounter Medications as of 10/09/2024  Medication Sig   acyclovir  (ZOVIRAX ) 200 MG capsule TAKE 2 CAPSULES BY MOUTH 2 TO 3 TIMES DAILY FOR 5 DAYS AS NEEDED   albuterol  (VENTOLIN  HFA) 108 (90 Base) MCG/ACT inhaler Inhale 2 puffs into the lungs every 4 (four) hours as needed for  wheezing or shortness of breath.   alendronate  (FOSAMAX ) 70 MG tablet Take 1 tablet (70 mg total) by mouth every 7 (seven) days. Take with a full glass of water on an empty stomach.   busPIRone  (BUSPAR ) 5 MG tablet Take 1 tablet (5 mg total) by mouth 2 (two) times daily.   citalopram  (CELEXA ) 20 MG tablet Take 1 tablet by mouth once daily   fluconazole  (DIFLUCAN ) 150 MG tablet 1 tablet po once a week   hydrOXYzine  (ATARAX ) 10 MG tablet Take 1 tablet (10 mg total) by mouth 3 (three) times daily as needed.   meloxicam  (MOBIC ) 15 MG tablet Take 1 tablet by mouth once daily   pantoprazole  (PROTONIX ) 40 MG tablet Take 1 tablet (40 mg total) by mouth daily.   promethazine  (PHENERGAN ) 25 MG tablet Take 1q8h prn N&V with Migraines   promethazine -dextromethorphan (PROMETHAZINE -DM) 6.25-15 MG/5ML syrup Take 2.5 mLs by mouth 3 (three) times daily as needed for cough (sedation precautions).   simvastatin  (ZOCOR ) 20 MG tablet TAKE 1 TABLET BY MOUTH AT BEDTIME   SUMAtriptan  (IMITREX ) 50 MG tablet Take 1 tablet at onset of migraine, may repeat in 2 hours if needed. No more than 2 doses in 24 hours   Vitamin D , Ergocalciferol , (DRISDOL ) 1.25 MG (50000 UNIT) CAPS capsule Take 1 capsule (50,000 Units total) by mouth every 7 (seven) days.   No facility-administered encounter medications on file as of 10/09/2024.

## 2024-10-09 ENCOUNTER — Ambulatory Visit: Admitting: Family Medicine

## 2024-10-09 ENCOUNTER — Encounter: Payer: Self-pay | Admitting: Family Medicine

## 2024-10-09 VITALS — BP 120/64 | HR 87 | Temp 99.5°F | Ht 63.0 in | Wt 201.2 lb

## 2024-10-09 DIAGNOSIS — J069 Acute upper respiratory infection, unspecified: Secondary | ICD-10-CM | POA: Diagnosis not present

## 2024-10-09 DIAGNOSIS — H9193 Unspecified hearing loss, bilateral: Secondary | ICD-10-CM

## 2024-10-09 DIAGNOSIS — H6993 Unspecified Eustachian tube disorder, bilateral: Secondary | ICD-10-CM | POA: Diagnosis not present

## 2024-10-09 MED ORDER — PREDNISONE 20 MG PO TABS: ORAL_TABLET | ORAL | 0 refills | Status: AC

## 2024-10-09 NOTE — Patient Instructions (Addendum)
 Eustachian Tube Dysfunction: There is a tube that connects between the sinuses and behind the ear called the eustachian tube. Sometimes when you have allergies, a cold, or nasal congestion for any reason this tube can get blocked and pressure cannot equalize in your ears. (Like if you swim in deep water) This can also trap fluid behind the ear and give you a full, pressure-like sensation that is uncomfortable, but it is not an ear infection.  Recommendations: Afrin Nasal Spray: 2 sprays twice a day for a maximum of 3-4 days (longer than this and your nose gets addicted, and you have rebound swelling that makes it worse.) Anti-Histamine: try a switch to Zyrtec

## 2024-11-01 ENCOUNTER — Other Ambulatory Visit: Payer: Self-pay | Admitting: Nurse Practitioner

## 2024-11-01 ENCOUNTER — Ambulatory Visit
Admission: RE | Admit: 2024-11-01 | Discharge: 2024-11-01 | Disposition: A | Discharge status: Home / Self Care | Source: Ambulatory Visit | Attending: Obstetrics and Gynecology | Admitting: Obstetrics and Gynecology

## 2024-11-01 ENCOUNTER — Other Ambulatory Visit: Payer: Self-pay | Admitting: Pulmonary Disease

## 2024-11-01 DIAGNOSIS — A6000 Herpesviral infection of urogenital system, unspecified: Secondary | ICD-10-CM

## 2024-11-01 DIAGNOSIS — Z1231 Encounter for screening mammogram for malignant neoplasm of breast: Secondary | ICD-10-CM

## 2024-11-08 ENCOUNTER — Encounter: Payer: Self-pay | Admitting: Nurse Practitioner

## 2025-02-07 ENCOUNTER — Ambulatory Visit
